# Patient Record
Sex: Female | Born: 1985 | Race: Black or African American | Hispanic: No | Marital: Single | Smoking: Never smoker
Health system: Southern US, Community
[De-identification: ages and names within clinical notes are randomized; demographics above are authoritative.]

## PROBLEM LIST (undated history)

## (undated) DIAGNOSIS — A599 Trichomoniasis, unspecified: Secondary | ICD-10-CM

## (undated) DIAGNOSIS — R7303 Prediabetes: Secondary | ICD-10-CM

## (undated) DIAGNOSIS — E119 Type 2 diabetes mellitus without complications: Secondary | ICD-10-CM

## (undated) DIAGNOSIS — M549 Dorsalgia, unspecified: Secondary | ICD-10-CM

## (undated) DIAGNOSIS — D649 Anemia, unspecified: Secondary | ICD-10-CM

## (undated) DIAGNOSIS — Z789 Other specified health status: Secondary | ICD-10-CM

## (undated) DIAGNOSIS — E669 Obesity, unspecified: Secondary | ICD-10-CM

## (undated) DIAGNOSIS — J020 Streptococcal pharyngitis: Secondary | ICD-10-CM

## (undated) DIAGNOSIS — O009 Unspecified ectopic pregnancy without intrauterine pregnancy: Secondary | ICD-10-CM

## (undated) DIAGNOSIS — B999 Unspecified infectious disease: Secondary | ICD-10-CM

## (undated) DIAGNOSIS — B9689 Other specified bacterial agents as the cause of diseases classified elsewhere: Secondary | ICD-10-CM

## (undated) DIAGNOSIS — G4730 Sleep apnea, unspecified: Secondary | ICD-10-CM

## (undated) DIAGNOSIS — N760 Acute vaginitis: Secondary | ICD-10-CM

## (undated) HISTORY — DX: Sleep apnea, unspecified: G47.30

## (undated) HISTORY — DX: Prediabetes: R73.03

## (undated) HISTORY — PX: SALPINGECTOMY: SHX328

## (undated) HISTORY — DX: Type 2 diabetes mellitus without complications: E11.9

## (undated) HISTORY — PX: TUBAL LIGATION: SHX77

## (undated) HISTORY — DX: Dorsalgia, unspecified: M54.9

---
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
## 2005-01-28 ENCOUNTER — Emergency Department (HOSPITAL_COMMUNITY): Admission: EM | Admit: 2005-01-28 | Discharge: 2005-01-29 | Payer: Self-pay | Admitting: Emergency Medicine

## 2005-10-20 ENCOUNTER — Emergency Department (HOSPITAL_COMMUNITY): Admission: AD | Admit: 2005-10-20 | Discharge: 2005-10-20 | Payer: Self-pay | Admitting: Family Medicine

## 2006-01-08 ENCOUNTER — Emergency Department (HOSPITAL_COMMUNITY): Admission: EM | Admit: 2006-01-08 | Discharge: 2006-01-08 | Payer: Self-pay | Admitting: Family Medicine

## 2006-01-10 ENCOUNTER — Emergency Department (HOSPITAL_COMMUNITY): Admission: EM | Admit: 2006-01-10 | Discharge: 2006-01-10 | Payer: Self-pay | Admitting: Emergency Medicine

## 2006-04-03 ENCOUNTER — Emergency Department (HOSPITAL_COMMUNITY): Admission: EM | Admit: 2006-04-03 | Discharge: 2006-04-03 | Payer: Self-pay | Admitting: Family Medicine

## 2006-04-15 ENCOUNTER — Inpatient Hospital Stay (HOSPITAL_COMMUNITY): Admission: AD | Admit: 2006-04-15 | Discharge: 2006-04-16 | Payer: Self-pay | Admitting: Obstetrics and Gynecology

## 2006-04-16 ENCOUNTER — Encounter (INDEPENDENT_AMBULATORY_CARE_PROVIDER_SITE_OTHER): Payer: Self-pay | Admitting: Specialist

## 2006-05-19 ENCOUNTER — Emergency Department (HOSPITAL_COMMUNITY): Admission: EM | Admit: 2006-05-19 | Discharge: 2006-05-19 | Payer: Self-pay | Admitting: Emergency Medicine

## 2007-01-02 ENCOUNTER — Emergency Department (HOSPITAL_COMMUNITY): Admission: EM | Admit: 2007-01-02 | Discharge: 2007-01-02 | Payer: Self-pay | Admitting: Family Medicine

## 2007-02-09 ENCOUNTER — Emergency Department (HOSPITAL_COMMUNITY): Admission: EM | Admit: 2007-02-09 | Discharge: 2007-02-09 | Payer: Self-pay | Admitting: Family Medicine

## 2007-09-13 ENCOUNTER — Emergency Department (HOSPITAL_COMMUNITY): Admission: EM | Admit: 2007-09-13 | Discharge: 2007-09-13 | Payer: Self-pay | Admitting: Family Medicine

## 2007-09-17 ENCOUNTER — Inpatient Hospital Stay (HOSPITAL_COMMUNITY): Admission: AD | Admit: 2007-09-17 | Discharge: 2007-09-17 | Payer: Self-pay | Admitting: Gynecology

## 2007-11-17 ENCOUNTER — Inpatient Hospital Stay (HOSPITAL_COMMUNITY): Admission: AD | Admit: 2007-11-17 | Discharge: 2007-11-18 | Payer: Self-pay | Admitting: Obstetrics

## 2008-05-06 ENCOUNTER — Emergency Department (HOSPITAL_COMMUNITY): Admission: EM | Admit: 2008-05-06 | Discharge: 2008-05-06 | Payer: Self-pay | Admitting: Emergency Medicine

## 2008-06-18 ENCOUNTER — Inpatient Hospital Stay (HOSPITAL_COMMUNITY): Admission: AD | Admit: 2008-06-18 | Discharge: 2008-06-19 | Payer: Self-pay | Admitting: Obstetrics & Gynecology

## 2008-08-23 ENCOUNTER — Emergency Department (HOSPITAL_COMMUNITY): Admission: EM | Admit: 2008-08-23 | Discharge: 2008-08-23 | Payer: Self-pay | Admitting: Family Medicine

## 2009-05-09 ENCOUNTER — Inpatient Hospital Stay (HOSPITAL_COMMUNITY): Admission: AD | Admit: 2009-05-09 | Discharge: 2009-05-09 | Payer: Self-pay | Admitting: Obstetrics

## 2009-10-03 ENCOUNTER — Emergency Department (HOSPITAL_COMMUNITY): Admission: EM | Admit: 2009-10-03 | Discharge: 2009-10-03 | Payer: Self-pay | Admitting: Family Medicine

## 2009-11-03 ENCOUNTER — Ambulatory Visit: Payer: Self-pay | Admitting: Nurse Practitioner

## 2009-11-26 ENCOUNTER — Emergency Department (HOSPITAL_COMMUNITY): Admission: EM | Admit: 2009-11-26 | Discharge: 2009-11-27 | Payer: Self-pay | Admitting: Emergency Medicine

## 2010-02-04 ENCOUNTER — Inpatient Hospital Stay (HOSPITAL_COMMUNITY)
Admission: AD | Admit: 2010-02-04 | Discharge: 2010-02-04 | Payer: Self-pay | Source: Home / Self Care | Admitting: Obstetrics & Gynecology

## 2010-02-08 ENCOUNTER — Inpatient Hospital Stay (HOSPITAL_COMMUNITY): Admission: AD | Admit: 2010-02-08 | Discharge: 2009-11-03 | Payer: Self-pay | Admitting: Obstetrics & Gynecology

## 2010-04-04 ENCOUNTER — Other Ambulatory Visit: Payer: Self-pay

## 2010-04-04 ENCOUNTER — Other Ambulatory Visit: Payer: Self-pay | Admitting: Family Medicine

## 2010-04-04 ENCOUNTER — Inpatient Hospital Stay (INDEPENDENT_AMBULATORY_CARE_PROVIDER_SITE_OTHER)
Admission: RE | Admit: 2010-04-04 | Discharge: 2010-04-04 | Disposition: A | Discharge status: Home / Self Care | Payer: Self-pay | Source: Ambulatory Visit | Attending: Emergency Medicine | Admitting: Emergency Medicine

## 2010-04-04 DIAGNOSIS — N12 Tubulo-interstitial nephritis, not specified as acute or chronic: Secondary | ICD-10-CM

## 2010-04-05 ENCOUNTER — Ambulatory Visit (INDEPENDENT_AMBULATORY_CARE_PROVIDER_SITE_OTHER): Payer: Self-pay

## 2010-04-05 DIAGNOSIS — S61209A Unspecified open wound of unspecified finger without damage to nail, initial encounter: Secondary | ICD-10-CM

## 2010-04-05 LAB — POCT URINALYSIS DIPSTICK
Ketones, ur: NEGATIVE mg/dL
Protein, ur: 100 mg/dL — AB
Specific Gravity, Urine: 1.01 (ref 1.005–1.030)

## 2010-04-05 LAB — POCT PREGNANCY, URINE: Preg Test, Ur: NEGATIVE

## 2010-04-06 LAB — URINE CULTURE
Colony Count: >=100000
Culture  Setup Time: 201202012330

## 2010-05-14 LAB — URINALYSIS, ROUTINE W REFLEX MICROSCOPIC
Bilirubin Urine: NEGATIVE
Glucose, UA: NEGATIVE mg/dL
Ketones, ur: NEGATIVE mg/dL
pH: 5.5 (ref 5.0–8.0)

## 2010-05-14 LAB — WET PREP, GENITAL: Trich, Wet Prep: NONE SEEN

## 2010-05-14 LAB — GC/CHLAMYDIA PROBE AMP, GENITAL
Chlamydia, DNA Probe: NEGATIVE
GC Probe Amp, Genital: NEGATIVE

## 2010-05-14 LAB — URINE MICROSCOPIC-ADD ON

## 2010-05-17 LAB — URINALYSIS, ROUTINE W REFLEX MICROSCOPIC
Bilirubin Urine: NEGATIVE
Specific Gravity, Urine: 1.025 (ref 1.005–1.030)
pH: 5.5 (ref 5.0–8.0)

## 2010-05-17 LAB — URINE MICROSCOPIC-ADD ON

## 2010-05-17 LAB — WET PREP, GENITAL

## 2010-05-17 LAB — GC/CHLAMYDIA PROBE AMP, GENITAL: GC Probe Amp, Genital: NEGATIVE

## 2010-05-17 LAB — POCT PREGNANCY, URINE: Preg Test, Ur: NEGATIVE

## 2010-05-27 LAB — URINALYSIS, ROUTINE W REFLEX MICROSCOPIC
Glucose, UA: NEGATIVE mg/dL
Specific Gravity, Urine: 1.01 (ref 1.005–1.030)
Urobilinogen, UA: 0.2 mg/dL (ref 0.0–1.0)

## 2010-05-27 LAB — URINE MICROSCOPIC-ADD ON

## 2010-05-27 LAB — CBC
HCT: 30.9 % — ABNORMAL LOW (ref 36.0–46.0)
Hemoglobin: 9.7 g/dL — ABNORMAL LOW (ref 12.0–15.0)
MCHC: 31.3 g/dL (ref 30.0–36.0)
Platelets: 395 10*3/uL (ref 150–400)
RDW: 18.8 % — ABNORMAL HIGH (ref 11.5–15.5)

## 2010-05-27 LAB — WET PREP, GENITAL: Yeast Wet Prep HPF POC: NONE SEEN

## 2010-05-27 LAB — POCT PREGNANCY, URINE: Preg Test, Ur: NEGATIVE

## 2010-06-11 LAB — POCT RAPID STREP A (OFFICE): Streptococcus, Group A Screen (Direct): POSITIVE — AB

## 2010-06-13 LAB — CBC
MCHC: 32.1 g/dL (ref 30.0–36.0)
MCV: 66.9 fL — ABNORMAL LOW (ref 78.0–100.0)
RDW: 17.4 % — ABNORMAL HIGH (ref 11.5–15.5)

## 2010-06-13 LAB — URINALYSIS, ROUTINE W REFLEX MICROSCOPIC
Nitrite: NEGATIVE
Specific Gravity, Urine: 1.025 (ref 1.005–1.030)
Urobilinogen, UA: 1 mg/dL (ref 0.0–1.0)
pH: 5.5 (ref 5.0–8.0)

## 2010-06-13 LAB — URINE MICROSCOPIC-ADD ON

## 2010-06-14 LAB — POCT RAPID STREP A (OFFICE): Streptococcus, Group A Screen (Direct): POSITIVE — AB

## 2010-06-16 ENCOUNTER — Inpatient Hospital Stay (HOSPITAL_COMMUNITY)
Admission: AD | Admit: 2010-06-16 | Discharge: 2010-06-16 | Disposition: A | Discharge status: Home / Self Care | Payer: Self-pay | Source: Ambulatory Visit | Attending: Obstetrics and Gynecology | Admitting: Obstetrics and Gynecology

## 2010-06-16 DIAGNOSIS — N920 Excessive and frequent menstruation with regular cycle: Secondary | ICD-10-CM

## 2010-06-16 LAB — POCT PREGNANCY, URINE: Preg Test, Ur: NEGATIVE

## 2010-06-16 LAB — URINALYSIS, ROUTINE W REFLEX MICROSCOPIC
Bilirubin Urine: NEGATIVE
Ketones, ur: NEGATIVE mg/dL
Nitrite: NEGATIVE
Specific Gravity, Urine: 1.02 (ref 1.005–1.030)
Urobilinogen, UA: 0.2 mg/dL (ref 0.0–1.0)

## 2010-06-16 LAB — URINE MICROSCOPIC-ADD ON

## 2010-06-16 LAB — WET PREP, GENITAL
Trich, Wet Prep: NONE SEEN
Yeast Wet Prep HPF POC: NONE SEEN

## 2010-06-19 LAB — GC/CHLAMYDIA PROBE AMP, GENITAL
Chlamydia, DNA Probe: UNDETERMINED
GC Probe Amp, Genital: NEGATIVE

## 2010-07-05 ENCOUNTER — Encounter: Payer: Self-pay | Admitting: Obstetrics and Gynecology

## 2010-07-12 ENCOUNTER — Emergency Department (HOSPITAL_COMMUNITY)
Admission: EM | Admit: 2010-07-12 | Discharge: 2010-07-13 | Disposition: A | Discharge status: Home / Self Care | Payer: Self-pay | Attending: Emergency Medicine | Admitting: Emergency Medicine

## 2010-07-12 DIAGNOSIS — M25539 Pain in unspecified wrist: Secondary | ICD-10-CM | POA: Insufficient documentation

## 2010-07-12 DIAGNOSIS — G5600 Carpal tunnel syndrome, unspecified upper limb: Secondary | ICD-10-CM | POA: Insufficient documentation

## 2010-07-18 ENCOUNTER — Other Ambulatory Visit: Payer: Self-pay | Admitting: Obstetrics and Gynecology

## 2010-07-18 ENCOUNTER — Encounter (INDEPENDENT_AMBULATORY_CARE_PROVIDER_SITE_OTHER): Payer: Self-pay | Admitting: Obstetrics and Gynecology

## 2010-07-18 DIAGNOSIS — Z124 Encounter for screening for malignant neoplasm of cervix: Secondary | ICD-10-CM

## 2010-07-18 DIAGNOSIS — Z01419 Encounter for gynecological examination (general) (routine) without abnormal findings: Secondary | ICD-10-CM

## 2010-07-18 DIAGNOSIS — N949 Unspecified condition associated with female genital organs and menstrual cycle: Secondary | ICD-10-CM

## 2010-07-19 ENCOUNTER — Ambulatory Visit (HOSPITAL_COMMUNITY)
Admission: RE | Admit: 2010-07-19 | Discharge: 2010-07-19 | Disposition: A | Discharge status: Home / Self Care | Payer: Self-pay | Source: Ambulatory Visit | Attending: Obstetrics and Gynecology | Admitting: Obstetrics and Gynecology

## 2010-07-19 DIAGNOSIS — N949 Unspecified condition associated with female genital organs and menstrual cycle: Secondary | ICD-10-CM

## 2010-07-19 DIAGNOSIS — N926 Irregular menstruation, unspecified: Secondary | ICD-10-CM | POA: Insufficient documentation

## 2010-07-19 DIAGNOSIS — N938 Other specified abnormal uterine and vaginal bleeding: Secondary | ICD-10-CM | POA: Insufficient documentation

## 2010-07-19 NOTE — Group Therapy Note (Signed)
NAME:  Regina Watkins, ARCHULETTA NO.:  [id number]  MEDICAL RECORD NO.:  [id number]           PATIENT TYPE:  A  LOCATION:  [facility]                   FACILITY:  [facility]  PHYSICIAN:  Argentina Donovan, MD        DATE OF BIRTH:  1986/02/28  DATE OF SERVICE:  07/18/2010                                 CLINIC NOTE  HISTORY OF PRESENT ILLNESS:  The patient is a 25 year old gravida 1, para 0-0-1-0 with a history of ectopic pregnancy 3 years ago with a salpingectomy, probably left.  She is morbidly obese weighing 289 pounds, being 5 feet 5 inches tall.  Her blood pressure is 143/82 and she comes in because of irregular bleeding.  She was referred by the maternity admissions.  No blood count was drawn at that time.  She does take the iron but she said up until a year ago she had very irregular periods.  She got pregnant while taking Yas 3 years ago which is not surprising being and taking birth control pills with a weight like that. On the other hand, she for the past almost a year has had regular periods but had been very irregularly using 10 pads a day and lasting 7 days at the beginning of each month.  She went into the MAU because this 1 month period.  It stopped for 4 days and then began bleeding and passing clots with pain.  She has not had a Pap smear in couple of years, so we went ahead and did one today as well as a wet prep, treated her with Flagyl for BV, and we are going to get an ultrasound, a CBC, and have her come back in 2 weeks and evaluate that.  She has no sign of hirsutism, although she is very heavy and up until this year as stated she always had irregular periods but they became regular over the past year when attempting pregnancy.  PHYSICAL EXAMINATION:  External genitalia is normal.  BUS is normal but extremely deep within the folds of the perineum.  Vagina is clean and well rugated with much of the redundant tissue as well as the cervix being nulliparous.  Uterus  and adnexa could not be palpated.  IMPRESSION:  Episode of dysfunctional bleeding, menorrhagia for past year, and morbid obesity.          ______________________________ Argentina Donovan, MD    PR/MEDQ  D:  07/18/2010  T:  07/19/2010  Job:  [id number]

## 2010-07-20 NOTE — Op Note (Signed)
NAME:  Regina Watkins, Regina Watkins            ACCOUNT NO.:  [id number]   MEDICAL RECORD NO.:  [id number]          PATIENT TYPE:  INP   LOCATION:  9305                          FACILITY:  [facility]   PHYSICIAN:  Roseanna Rainbow, M.D.DATE OF BIRTH:  Feb 03, 1986   DATE OF PROCEDURE:  04/16/2006  DATE OF DISCHARGE:                               OPERATIVE REPORT   PREOPERATIVE DIAGNOSIS:  Rule out right-sided tubal ectopic pregnancy.   POSTOPERATIVE DIAGNOSIS:  Rule out right-sided tubal ectopic pregnancy.   PROCEDURE:  Operative laparoscopy, right salpingectomy.   SURGEON:  Tim Lair.   ANESTHESIA:  General tracheal.   PATHOLOGY:  Right fallopian tube with products of conception.   ESTIMATED BLOOD LOSS:  Minimal.   COMPLICATIONS:  None.   PROCEDURE:  The patient was taken to the operating room with an IV  running.  She was given general anesthesia, placed in the dorsal  lithotomy position and prepped and draped in the usual sterile fashion.  An infraumbilical skin incision was then made with a scalpel and carried  down to the underlying fascia.  Fascia was tented up and entered  sharply.  The parietal peritoneum was tented up and entered sharply as  well.  A Hasson trocar sleeve was then advanced into the abdomen.  The  abdomen was then insufflated with CO2 gas.  A survey of the pelvic  anatomy revealed a mid isthmic tubal ectopic pregnancy on the right.  The tube was intact; however, the tube was dilated several centimeters.  The Hulka manipulator was advanced into the uterus as a means to  manipulate the uterus.  Bilateral lower quadrant 5-mm trocar and sleeves  were then advanced into the abdomen under direct visualization as well  as a suprapubic trocar and sleeve.  The mesosalpinx was then cauterized  with using the Kleppinger and divided with Endoshears.  The tube was  placed in an EndoCatch and removed from the abdomen.  The operative site  was irrigated.  Adequate  hemostasis noted.  The suprapubic and bilateral  lower abdominal trocar sleeves were removed under direct visualization.  The fascial incisions for the infraumbilical incision as well as the  suprapubic incision was reapproximated using interrupted sutures of 0  Vicryl.  The skin was closed with interrupted sutures of 0 Monocryl in a  subcuticular fashion.  Dermabond was applied to the incisions.  At the  close of the procedure, the instrument and pack counts were said to be  correct x2.  The Hulka manipulator was removed from the vagina and  cervix with minimal bleeding noted.  The patient was taken to the PACU  awake and in stable condition.     Roseanna Rainbow, M.D.  Electronically Signed    LAJ/MEDQ  D:  04/16/2006  T:  04/16/2006  Job:  [id number]

## 2010-08-08 ENCOUNTER — Ambulatory Visit: Payer: Self-pay | Admitting: Obstetrics and Gynecology

## 2010-08-16 ENCOUNTER — Ambulatory Visit: Payer: Self-pay | Admitting: Physician Assistant

## 2010-09-12 ENCOUNTER — Inpatient Hospital Stay (HOSPITAL_COMMUNITY)
Admission: AD | Admit: 2010-09-12 | Discharge: 2010-09-13 | Disposition: A | Discharge status: Home / Self Care | Payer: Self-pay | Source: Ambulatory Visit | Attending: Obstetrics & Gynecology | Admitting: Obstetrics & Gynecology

## 2010-09-12 DIAGNOSIS — R300 Dysuria: Secondary | ICD-10-CM | POA: Insufficient documentation

## 2010-09-12 HISTORY — DX: Other specified health status: Z78.9

## 2010-09-12 LAB — URINALYSIS, ROUTINE W REFLEX MICROSCOPIC
Bilirubin Urine: NEGATIVE
Glucose, UA: NEGATIVE mg/dL
Hgb urine dipstick: NEGATIVE
Ketones, ur: NEGATIVE mg/dL
Leukocytes, UA: NEGATIVE
Nitrite: NEGATIVE
Protein, ur: NEGATIVE mg/dL
Specific Gravity, Urine: 1.015 (ref 1.005–1.030)
Urobilinogen, UA: 2 mg/dL — ABNORMAL HIGH (ref 0.0–1.0)
pH: 7.5 (ref 5.0–8.0)

## 2010-09-12 NOTE — Progress Notes (Signed)
Pt states, " I have burning for one week when I pee and sometimes it feels like I have to go right back."

## 2010-09-13 ENCOUNTER — Encounter (HOSPITAL_COMMUNITY): Payer: Self-pay | Admitting: *Deleted

## 2010-09-13 NOTE — Progress Notes (Signed)
Pt given ama instructions.  Verbalized understanding of leaving ama without being seen.  Instructions signed.

## 2010-09-13 NOTE — ED Notes (Signed)
Pt called out to say that she couldn't wait any longer.  Encouraged pt to wait, that we have called another provider to come help with large pt volume.  Pt states she wishes to leave.

## 2010-09-13 NOTE — Progress Notes (Signed)
MAU provider aware of pt complaint of delay.  Aware of pt wish to sign out ama.

## 2010-09-13 NOTE — Initial Assessments (Signed)
Pt presents to mau with c/o burning with urination.  Has been going on for 2 weeks.  Pt states she was seen in may or June at the clinic and was told she had no infections.  Pt states she is not sexually active.

## 2010-11-29 LAB — POCT PREGNANCY, URINE: Preg Test, Ur: NEGATIVE

## 2010-11-30 LAB — WET PREP, GENITAL
Clue Cells Wet Prep HPF POC: NONE SEEN
Trich, Wet Prep: NONE SEEN

## 2010-11-30 LAB — POCT PREGNANCY, URINE
Operator id: 251141
Preg Test, Ur: NEGATIVE

## 2010-11-30 LAB — GC/CHLAMYDIA PROBE AMP, GENITAL
Chlamydia, DNA Probe: NEGATIVE
GC Probe Amp, Genital: NEGATIVE

## 2010-11-30 LAB — URINALYSIS, ROUTINE W REFLEX MICROSCOPIC
Bilirubin Urine: NEGATIVE
Glucose, UA: NEGATIVE
Ketones, ur: NEGATIVE
Protein, ur: NEGATIVE
Urobilinogen, UA: 0.2

## 2010-11-30 LAB — CBC
Hemoglobin: 11.4 — ABNORMAL LOW
MCHC: 32.2
RBC: 4.92
WBC: 10.4

## 2010-11-30 LAB — ALT: ALT: 19

## 2010-11-30 LAB — HCG, QUANTITATIVE, PREGNANCY: hCG, Beta Chain, Quant, S: <2

## 2010-12-10 LAB — POCT URINALYSIS DIP (DEVICE)
Glucose, UA: NEGATIVE
Hgb urine dipstick: NEGATIVE
Ketones, ur: 15 — AB
Nitrite: NEGATIVE
pH: 5

## 2010-12-10 LAB — POCT PREGNANCY, URINE: Preg Test, Ur: NEGATIVE

## 2010-12-12 LAB — POCT PREGNANCY, URINE: Operator id: 239701

## 2011-04-05 ENCOUNTER — Encounter (HOSPITAL_COMMUNITY): Payer: Self-pay | Admitting: *Deleted

## 2011-04-05 ENCOUNTER — Inpatient Hospital Stay (HOSPITAL_COMMUNITY)
Admission: AD | Admit: 2011-04-05 | Discharge: 2011-04-05 | Disposition: A | Discharge status: Home / Self Care | Payer: Self-pay | Source: Ambulatory Visit | Attending: Obstetrics and Gynecology | Admitting: Obstetrics and Gynecology

## 2011-04-05 DIAGNOSIS — N390 Urinary tract infection, site not specified: Secondary | ICD-10-CM | POA: Insufficient documentation

## 2011-04-05 DIAGNOSIS — R300 Dysuria: Secondary | ICD-10-CM | POA: Insufficient documentation

## 2011-04-05 DIAGNOSIS — A499 Bacterial infection, unspecified: Secondary | ICD-10-CM | POA: Insufficient documentation

## 2011-04-05 DIAGNOSIS — N760 Acute vaginitis: Secondary | ICD-10-CM | POA: Insufficient documentation

## 2011-04-05 DIAGNOSIS — B9689 Other specified bacterial agents as the cause of diseases classified elsewhere: Secondary | ICD-10-CM | POA: Insufficient documentation

## 2011-04-05 LAB — URINALYSIS, ROUTINE W REFLEX MICROSCOPIC
Ketones, ur: NEGATIVE mg/dL
Nitrite: NEGATIVE
Protein, ur: NEGATIVE mg/dL
pH: 7 (ref 5.0–8.0)

## 2011-04-05 LAB — WET PREP, GENITAL

## 2011-04-05 LAB — URINE MICROSCOPIC-ADD ON

## 2011-04-05 MED ORDER — SULFAMETHOXAZOLE-TRIMETHOPRIM 800-160 MG PO TABS: 1.0000 | ORAL_TABLET | Freq: Two times a day (BID) | ORAL | Status: AC

## 2011-04-05 MED ORDER — METRONIDAZOLE 500 MG PO TABS: 500.0000 mg | ORAL_TABLET | Freq: Two times a day (BID) | ORAL | Status: AC

## 2011-04-05 NOTE — Progress Notes (Signed)
Pt states, " Today I started having burning and pressure in my low abdomen when I pee, and when I wiped I saw light pink blood. I've been using a different soap this week, and felt some vaginal irritation and it has gotten worse and I have a little odor."

## 2011-04-05 NOTE — Progress Notes (Signed)
W. Muhammed CNM in to discuss lab results and d/c plan. Written and verbal d/c instructions given and understanding voiced. 

## 2011-04-05 NOTE — ED Provider Notes (Signed)
History     Chief Complaint  Patient presents with  . Vaginal Discharge  . Dysuria   HPI  Pt states, " Today I started having burning and pressure in my low abdomen when I pee, and when I wiped I saw light pink blood. I've been using a different soap this week, and felt some vaginal irritation and it has gotten worse".  Pt also reports some vaginal odor.   Past Medical History  Diagnosis Date  . No pertinent past medical history     Past Surgical History  Procedure Date  . Salpingectomy     Family History  Problem Relation Age of Onset  . Heart disease Mother   . Diabetes Mother   . Diabetes Father   . Heart disease Father   . Anesthesia problems Neg Hx   . Hypotension Neg Hx   . Malignant hyperthermia Neg Hx   . Pseudochol deficiency Neg Hx     History  Substance Use Topics  . Smoking status: Never Smoker   . Smokeless tobacco: Not on file  . Alcohol Use: No    Allergies: No Known Allergies  Prescriptions prior to admission  Medication Sig Dispense Refill  . Prenatal Vit-Fe Fumarate-FA (PRENATAL MULTIVITAMIN) TABS Take 1 tablet by mouth daily.        Review of Systems  Constitutional: Negative.   Genitourinary: Positive for dysuria, urgency and hematuria. Negative for frequency and flank pain.       Vaginal odor; spotting with wiping  All other systems reviewed and are negative.   Physical Exam   Blood pressure 121/62, pulse 67, temperature 98.2 F (36.8 C), temperature source Oral, resp. rate 16, height 5\' 5"  (1.651 m), weight 126.327 kg (278 lb 8 oz), last menstrual period 03/19/2011.  Physical Exam  Constitutional: She is oriented to person, place, and time. She appears well-developed and well-nourished. No distress.  HENT:  Head: Normocephalic.  Neck: Normal range of motion. Neck supple.  Cardiovascular: Normal rate and regular rhythm.   Respiratory: Effort normal and breath sounds normal.  GI: Soft. She exhibits no mass. There is no  tenderness. There is no guarding.  Genitourinary: Vaginal discharge (white, creamy) found.       +fishy odor  Neurological: She is alert and oriented to person, place, and time. She has normal reflexes.  Skin: Skin is warm and dry.    MAU Course  Procedures  Results for orders placed during the hospital encounter of 04/05/11 (from the past 24 hour(s))  URINALYSIS, ROUTINE W REFLEX MICROSCOPIC     Status: Abnormal   Collection Time   04/05/11 10:31 PM      Component Value Range   Color, Urine YELLOW  YELLOW    APPearance CLEAR  CLEAR    Specific Gravity, Urine 1.015  1.005 - 1.030    pH 7.0  5.0 - 8.0    Glucose, UA NEGATIVE  NEGATIVE (mg/dL)   Hgb urine dipstick LARGE (*) NEGATIVE    Bilirubin Urine NEGATIVE  NEGATIVE    Ketones, ur NEGATIVE  NEGATIVE (mg/dL)   Protein, ur NEGATIVE  NEGATIVE (mg/dL)   Urobilinogen, UA 1.0  0.0 - 1.0 (mg/dL)   Nitrite NEGATIVE  NEGATIVE    Leukocytes, UA SMALL (*) NEGATIVE   URINE MICROSCOPIC-ADD ON     Status: Abnormal   Collection Time   04/05/11 10:31 PM      Component Value Range   Squamous Epithelial / LPF FEW (*) RARE  WBC, UA 11-20  <3 (WBC/hpf)   RBC / HPF TOO NUMEROUS TO COUNT  <3 (RBC/hpf)   Urine-Other MUCOUS PRESENT    WET PREP, GENITAL     Status: Abnormal   Collection Time   04/05/11 11:00 PM      Component Value Range   Yeast Wet Prep HPF POC NONE SEEN  NONE SEEN    Trich, Wet Prep NONE SEEN  NONE SEEN    Clue Cells Wet Prep HPF POC MODERATE (*) NONE SEEN    WBC, Wet Prep HPF POC FEW (*) NONE SEEN    Urine pregnancy test - negative  Assessment and Plan  Urinary Tract Infection Bacterial Vaginosis  Plan: RX Bactrim Flagyl Urine culture F/U as needed  [facility] 04/05/2011, 10:57 PM

## 2011-04-06 LAB — GC/CHLAMYDIA PROBE AMP, GENITAL
Chlamydia, DNA Probe: NEGATIVE
GC Probe Amp, Genital: NEGATIVE

## 2011-04-08 LAB — POCT PREGNANCY, URINE: Preg Test, Ur: NEGATIVE

## 2011-04-09 NOTE — ED Provider Notes (Signed)
Attestation of Attending Supervision of Advanced Practitioner: Evaluation and management procedures were performed by the PA/NP/CNM/OB Fellow under my supervision/collaboration. Chart reviewed and agree with management and plan.  Tilda Burrow 04/09/2011 6:53 PM

## 2011-04-22 ENCOUNTER — Other Ambulatory Visit: Payer: Self-pay | Admitting: Family

## 2011-04-22 MED ORDER — FLUCONAZOLE 150 MG PO TABS: 150.0000 mg | ORAL_TABLET | Freq: Once | ORAL | Status: AC

## 2011-06-10 ENCOUNTER — Ambulatory Visit (INDEPENDENT_AMBULATORY_CARE_PROVIDER_SITE_OTHER): Payer: BC Managed Care – PPO | Admitting: Advanced Practice Midwife

## 2011-06-10 ENCOUNTER — Encounter: Payer: Self-pay | Admitting: Obstetrics and Gynecology

## 2011-06-10 VITALS — BP 120/86 | HR 87 | Temp 97.9°F | Ht 64.5 in | Wt 281.7 lb

## 2011-06-10 DIAGNOSIS — Z8759 Personal history of other complications of pregnancy, childbirth and the puerperium: Secondary | ICD-10-CM

## 2011-06-10 DIAGNOSIS — Z124 Encounter for screening for malignant neoplasm of cervix: Secondary | ICD-10-CM

## 2011-06-10 DIAGNOSIS — N979 Female infertility, unspecified: Secondary | ICD-10-CM

## 2011-06-10 DIAGNOSIS — Z8742 Personal history of other diseases of the female genital tract: Secondary | ICD-10-CM

## 2011-06-10 DIAGNOSIS — Z01419 Encounter for gynecological examination (general) (routine) without abnormal findings: Secondary | ICD-10-CM

## 2011-06-10 DIAGNOSIS — N644 Mastodynia: Secondary | ICD-10-CM

## 2011-06-10 NOTE — Patient Instructions (Signed)
Infertility WHAT IS INFERTILITY?  Infertility is usually defined as not being able to get pregnant after trying for one year of regular sexual intercourse without the use of contraceptives. Or not being able to carry a pregnancy to term and have a baby. The infertility rate in the [place] is around 10%. Pregnancy is the result of a chain of events. A woman must release an egg from one of her ovaries (ovulation). The egg must be fertilized by the female sperm. Then it travels through a fallopian tube into the uterus (womb), where it attaches to the wall of the uterus and grows. A man must have enough sperm, and the sperm must join with (fertilize) the egg along the way, at the proper time. The fertilized egg must then become attached to the inside of the uterus. While this may seem simple, many things can happen to prevent pregnancy from occurring.  WHOSE PROBLEM IS IT?  About 20% of infertility cases are due to problems with the man (female factors) and 65% are due to problems with the woman (female factors). Other cases are due to a combination of female and female factors or to unknown causes.  WHAT CAUSES INFERTILITY IN MEN?  Infertility in men is often caused by problems with making enough normal sperm or getting the sperm to reach the egg. Problems with sperm may exist from birth or develop later in life, due to illness or injury. Some men produce no sperm, or produce too few sperm (oligospermia). Other problems include: Sexual dysfunction.  Hormonal or endocrine problems.  Age. Female fertility decreases with age, but not at as young an age as female fertility.  Infection.  Congenital problems. Birth defect, such as absence of the tubes that carry the sperm (vas deferens).  Genetic/chromosomal problems.  Antisperm antibody problems.  Retrograde ejaculation (sperm go into the bladder).  Varicoceles, spematoceles, or tumors of the testicles.  Lifestyle can influence the number and quality of a  man's sperm.  Alcohol and drugs can temporarily reduce sperm quality.  Environmental toxins, including pesticides and lead, may cause some cases of infertility in men.  WHAT CAUSES INFERTILITY IN WOMEN?  Problems with ovulation account for most infertility in women. Without ovulation, eggs are not available to be fertilized.  Signs of problems with ovulation include irregular menstrual periods or no periods at all.  Simple lifestyle factors, including stress, diet, or athletic training, can affect a woman's hormonal balance.  Age. Fertility begins to decrease in women in the early 56s and is worse after age 104.  Much less often, a hormonal imbalance from a serious medical problem, such as a pituitary gland tumor, thyroid or other chronic medical disease, can cause ovulation problems.  Pelvic infections.  Polycystic ovary syndrome (increase in female hormones, unable to ovulate).  Alcohol or illegal drugs.  Environmental toxins, radiation, pesticides, and certain chemicals.  Aging is an important factor in female infertility.  The ability of a woman's ovaries to produce eggs declines with age, especially after age 63. About one third of couples where the woman is over 35 will have problems with fertility.  By the time she reaches menopause when her monthly periods stop for good, a woman can no longer produce eggs or become pregnant.  Other problems can also lead to infertility in women. If the fallopian tubes are blocked at one or both ends, the egg cannot travel through the tubes into the uterus. Scar tissue (adhesions) in the pelvis may cause blocked tubes.  This may result from pelvic inflammatory disease, endometriosis, or surgery for an ectopic pregnancy (fertilized egg implanted outside the uterus) or any pelvic or abdominal surgery causing adhesions.  Fibroid tumors or polyps of the uterus.  Congenital (birth defect) abnormalities of the uterus.  Infection of the cervix (cervicitis).  Cervical  stenosis (narrowing).  Abnormal cervical mucus.  Polycystic ovary syndrome.  Having sexual intercourse too often (every other day or 4 to 5 times a week).  Obesity.  Anorexia.  Poor nutrition.  Over exercising, with loss of body fat.  DES. Your mother received diethylstilbesterol hormone when pregnant with you.  HOW IS INFERTILITY TESTED?  If you have been trying to have a baby without success, you may want to seek medical help. You should not wait for one year of trying before seeing a health care provider if: You are over 35.  You have reason to believe that there may be a fertility problem.  A medical evaluation may determine the reasons for a couple's infertility. Usually this process begins with: Physical exams.  Medical histories of both partners.  Sexual histories of both partners.  If there is no obvious problem, like improperly timed intercourse or absence of ovulation, tests may be needed.  For a man, testing usually begins with tests of his semen to look at:  The number of sperm.  The shape of sperm.  Movement of his sperm.  Taking a complete medical and surgical history.  Physical examination.  Check for infection of the female reproductive organs.  Sometimes hormone tests are done.  For a woman, the first step in testing is to find out if she is ovulating each month. There are several ways to do this. For example, she can keep track of changes in her morning body temperature and in the texture of her cervical mucus. Another tool is a home ovulation test kit, which can be bought at drug or grocery stores.  Checks of ovulation can also be done in the doctor's office, using blood tests for hormone levels or ultrasound tests of the ovaries. If the woman is ovulating, more tests will need to be done. Some common female tests include:  Hysterosalpingogram: An x-ray of the fallopian tubes and uterus after they are injected with dye. It shows if the tubes are open and shows the shape  of the uterus.  Laparoscopy: An exam of the tubes and other female organs for disease. A lighted tube called a laparoscope is used to see inside the abdomen.  Endometrial biopsy: Sample of uterus tissue taken on the first day of the menstrual period, to see if the tissue indicates you are ovulating.  Transvaginal ultrasound: Examines the female organs.  Hysteroscopy: Uses a lighted tube to examine the cervix and inside the uterus, to see if there are any abnormalities inside the uterus.  TREATMENT  Depending on the test results, different treatments can be suggested. The type of treatment depends on the cause. 85 to 90% of infertility cases are treated with drugs or surgery.  Various fertility drugs may be used for women with ovulation problems. It is important to talk with your caregiver about the drug to be used. You should understand the drug's benefits and side effects. Depending on the type of fertility drug and the dosage of the drug used, multiple births (twins or multiples) can occur in some women.  If needed, surgery can be done to repair damage to a woman's ovaries, fallopian tubes, cervix, or uterus.  Surgery or  medical treatment for endometriosis or polycystic ovary syndrome. Sometimes a man has an infertility problem that can be corrected with medicine or by surgery.  Intrauterine insemination (IUI) of sperm, timed with ovulation.  Change in lifestyle, if that is the cause (lose weight, increase exercise, and stop smoking, drinking excessively, or taking illegal drugs).  Other types of surgery:  Removing growths inside and on the uterus.  Removing scar tissue from inside of the uterus.  Fixing blocked tubes.  Removing scar tissue in the pelvis and around the female organs.  WHAT IS ASSISTED REPRODUCTIVE TECHNOLOGY (ART)?  Assisted reproductive technology (ART) is another form of special methods used to help infertile couples. ART involves handling both the woman's eggs and the man's  sperm. Success rates vary and depend on many factors. ART can be expensive and time-consuming. But ART has made it possible for many couples to have children that otherwise would not have been conceived. Some methods are listed below: In vitro fertilization (IVF). IVF is often used when a woman's fallopian tubes are blocked or when a man has low sperm counts. A drug is used to stimulate the ovaries to produce multiple eggs. Once mature, the eggs are removed and placed in a culture dish with the man's sperm for fertilization. After about 40 hours, the eggs are examined to see if they have become fertilized by the sperm and are dividing into cells. These fertilized eggs (embryos) are then placed in the woman's uterus. This bypasses the fallopian tubes.  Gamete intrafallopian transfer (GIFT) is similar to IVF, but used when the woman has at least one normal fallopian tube. Three to five eggs are placed in the fallopian tube, along with the man's sperm, for fertilization inside the woman's body.  Zygote intrafallopian transfer (ZIFT), also called tubal embryo transfer, combines IVF and GIFT. The eggs retrieved from the woman's ovaries are fertilized in the lab and placed in the fallopian tubes rather than in the uterus.  ART procedures sometimes involve the use of donor eggs (eggs from another woman) or previously frozen embryos. Donor eggs may be used if a woman has impaired ovaries or has a genetic disease that could be passed on to her baby.  When performing ART, you are at higher risk for resulting in multiple pregnancies, twins, triplets or more.  Intracytoplasma sperm injection is a procedure that injects a single sperm into the egg to fertilize it.  Embryo transplant is a procedure that starts after growing an embryo in a special media (chemical solution) developed to keep the embryo alive for 2 to 5 days, and then transplanting it into the uterus.  In cases where a cause cannot be found and pregnancy  does not occur, adoption may be a consideration. Document Released: 02/21/2003 Document Revised: 02/07/2011 Document Reviewed: 01/17/2009 [facility] Patient Information 2012 [place], [place].Health Maintenance, Females A healthy lifestyle and preventative care can promote health and wellness.  Maintain regular health, dental, and eye exams.   Eat a healthy diet. Foods like vegetables, fruits, whole grains, low-fat dairy products, and lean protein foods contain the nutrients you need without too many calories. Decrease your intake of foods high in solid fats, added sugars, and salt. Get information about a proper diet from your caregiver, if necessary.   Regular physical exercise is one of the most important things you can do for your health. Most adults should get at least 150 minutes of moderate-intensity exercise (any activity that increases your heart rate and causes you to sweat)  each week. In addition, most adults need muscle-strengthening exercises on 2 or more days a week.    Maintain a healthy weight. The body mass index (BMI) is a screening tool to identify possible weight problems. It provides an estimate of body fat based on height and weight. Your caregiver can help determine your BMI, and can help you achieve or maintain a healthy weight. For adults 20 years and older:   A BMI below 18.5 is considered underweight.   A BMI of 18.5 to 24.9 is normal.   A BMI of 25 to 29.9 is considered overweight.   A BMI of 30 and above is considered obese.   Maintain normal blood lipids and cholesterol by exercising and minimizing your intake of saturated fat. Eat a balanced diet with plenty of fruits and vegetables. Blood tests for lipids and cholesterol should begin at age 37 and be repeated every 5 years. If your lipid or cholesterol levels are high, you are over 50, or you are a high risk for heart disease, you may need your cholesterol levels checked more frequently.Ongoing high lipid and  cholesterol levels should be treated with medicines if diet and exercise are not effective.   If you smoke, find out from your caregiver how to quit. If you do not use tobacco, do not start.   If you are pregnant, do not drink alcohol. If you are breastfeeding, be very cautious about drinking alcohol. If you are not pregnant and choose to drink alcohol, do not exceed 1 drink per day. One drink is considered to be 12 ounces (355 mL) of beer, 5 ounces (148 mL) of wine, or 1.5 ounces (44 mL) of liquor.   Avoid use of street drugs. Do not share needles with anyone. Ask for help if you need support or instructions about stopping the use of drugs.   High blood pressure causes heart disease and increases the risk of stroke. Blood pressure should be checked at least every 1 to 2 years. Ongoing high blood pressure should be treated with medicines, if weight loss and exercise are not effective.   If you are 21 to 26 years old, ask your caregiver if you should take aspirin to prevent strokes.   Diabetes screening involves taking a blood sample to check your fasting blood sugar level. This should be done once every 3 years, after age 104, if you are within normal weight and without risk factors for diabetes. Testing should be considered at a younger age or be carried out more frequently if you are overweight and have at least 1 risk factor for diabetes.   Breast cancer screening is essential preventative care for women. You should practice "breast self-awareness." This means understanding the normal appearance and feel of your breasts and may include breast self-examination. Any changes detected, no matter how small, should be reported to a caregiver. Women in their 77s and 30s should have a clinical breast exam (CBE) by a caregiver as part of a regular health exam every 1 to 3 years. After age 71, women should have a CBE every year. Starting at age 40, women should consider having a mammogram (breast X-ray) every  year. Women who have a family history of breast cancer should talk to their caregiver about genetic screening. Women at a high risk of breast cancer should talk to their caregiver about having an MRI and a mammogram every year.   The Pap test is a screening test for cervical cancer. Women should have a  Pap test starting at age 2. Between ages 15 and 24, Pap tests should be repeated every 2 years. Beginning at age 24, you should have a Pap test every 3 years as long as the past 3 Pap tests have been normal. If you had a hysterectomy for a problem that was not cancer or a condition that could lead to cancer, then you no longer need Pap tests. If you are between ages 60 and 63, and you have had normal Pap tests going back 10 years, you no longer need Pap tests. If you have had past treatment for cervical cancer or a condition that could lead to cancer, you need Pap tests and screening for cancer for at least 20 years after your treatment. If Pap tests have been discontinued, risk factors (such as a new sexual partner) need to be reassessed to determine if screening should be resumed. Some women have medical problems that increase the chance of getting cervical cancer. In these cases, your caregiver may recommend more frequent screening and Pap tests.   The human papillomavirus (HPV) test is an additional test that may be used for cervical cancer screening. The HPV test looks for the virus that can cause the cell changes on the cervix. The cells collected during the Pap test can be tested for HPV. The HPV test could be used to screen women aged 65 years and older, and should be used in women of any age who have unclear Pap test results. After the age of 67, women should have HPV testing at the same frequency as a Pap test.   Colorectal cancer can be detected and often prevented. Most routine colorectal cancer screening begins at the age of 30 and continues through age 20. However, your caregiver may recommend  screening at an earlier age if you have risk factors for colon cancer. On a yearly basis, your caregiver may provide home test kits to check for hidden blood in the stool. Use of a small camera at the end of a tube, to directly examine the colon (sigmoidoscopy or colonoscopy), can detect the earliest forms of colorectal cancer. Talk to your caregiver about this at age 72, when routine screening begins. Direct examination of the colon should be repeated every 5 to 10 years through age 43, unless early forms of pre-cancerous polyps or small growths are found.   Hepatitis C blood testing is recommended for all people born from [birth year] through 1965 and any individual with known risks for hepatitis C.   Practice safe sex. Use condoms and avoid high-risk sexual practices to reduce the spread of sexually transmitted infections (STIs). Sexually active women aged 9 and younger should be checked for Chlamydia, which is a common sexually transmitted infection. Older women with new or multiple partners should also be tested for Chlamydia. Testing for other STIs is recommended if you are sexually active and at increased risk.   Osteoporosis is a disease in which the bones lose minerals and strength with aging. This can result in serious bone fractures. The risk of osteoporosis can be identified using a bone density scan. Women ages 81 and over and women at risk for fractures or osteoporosis should discuss screening with their caregivers. Ask your caregiver whether you should be taking a calcium supplement or vitamin D to reduce the rate of osteoporosis.   Menopause can be associated with physical symptoms and risks. Hormone replacement therapy is available to decrease symptoms and risks. You should talk to your caregiver about whether  hormone replacement therapy is right for you.   Use sunscreen with a sun protection factor (SPF) of 30 or greater. Apply sunscreen liberally and repeatedly throughout the day. You should  seek shade when your shadow is shorter than you. Protect yourself by wearing long sleeves, pants, a wide-brimmed hat, and sunglasses year round, whenever you are outdoors.   Notify your caregiver of new moles or changes in moles, especially if there is a change in shape or color. Also notify your caregiver if a mole is larger than the size of a pencil eraser.   Stay current with your immunizations.  Document Released: 09/03/2010 Document Revised: 02/07/2011 Document Reviewed: 09/03/2010 [facility] Patient Information 2012 [place], [place].

## 2011-06-10 NOTE — Progress Notes (Signed)
  Subjective:     Regina Watkins is a 26 y.o. female here for a routine exam.  Current complaints: Needs pap, intermittent sharp left breast pain near nipple, occasional dyspareunia, difficulty getting pregnant after 7 yrs of unprotected intercourse (though did have an ectopic during that time).  Personal health questionnaire reviewed: not asked.    Gynecologic History Patient's last menstrual period was 05/21/2011. Contraception: none Last Pap: unsure. Results were: normal Last mammogram: never.  Obstetric History OB History    Grav Para Term Preterm Abortions TAB SAB Ect Mult Living   1 0 0 0 1 0 0 1 0 0      # Outc Date GA Lbr Len/2nd Wgt Sex Del Anes PTL Lv   1 ECT 2/09               The following portions of the patient's history were reviewed and updated as appropriate: allergies, current medications, past family history, past medical history, past social history, past surgical history and problem list.  Review of Systems Pertinent items are noted in HPI.   left breast pain, occ dyspareunia infertility Objective:    BP 120/86  Pulse 87  Temp(Src) 97.9 F (36.6 C) (Oral)  Ht 5' 4.5" (1.638 m)  Wt 281 lb 11.2 oz (127.778 kg)  BMI 47.61 kg/m2  LMP 05/21/2011 General appearance: alert and no distress Neck: no adenopathy, no carotid bruit, no JVD, supple, symmetrical, trachea midline and thyroid not enlarged, symmetric, no tenderness/mass/nodules Lungs: clear to auscultation bilaterally Breasts: normal appearance, no masses or tenderness some fibrocystic changes left breast Heart: regular rate and rhythm, S1, S2 normal, no murmur, click, rub or gallop Abdomen: soft, non-tender; bowel sounds normal; no masses,  no organomegaly Pelvic: adnexae not palpable, cervix normal in appearance, external genitalia normal, no adnexal masses or tenderness, no cervical motion tenderness, rectovaginal septum normal, uterus normal size, shape, and consistency and vagina normal without  discharge    Assessment:    Healthy female exam.    Plan:  L Breast US  Education reviewed: Possible eval for PCOS may be needed, infertility workup, weight loss, positional dyspareunia, selfbreast exam taught. Follow up in: 1 year. Will schedule L Breast US at [facility], though no suspicious mass noted other than fibrocystic changes   Discussed limiting caffeine Will see back as needed. Don't anticipate them finding anything on breast US, but will see if needed. Discussed negative pain with pelvic, dyspareunia is probably positional Needs infertility workup via infertility clinic if desired

## 2011-06-11 ENCOUNTER — Encounter: Payer: Self-pay | Admitting: Advanced Practice Midwife

## 2011-06-11 DIAGNOSIS — N644 Mastodynia: Secondary | ICD-10-CM | POA: Insufficient documentation

## 2011-06-11 DIAGNOSIS — N979 Female infertility, unspecified: Secondary | ICD-10-CM | POA: Insufficient documentation

## 2011-06-11 DIAGNOSIS — Z8759 Personal history of other complications of pregnancy, childbirth and the puerperium: Secondary | ICD-10-CM | POA: Insufficient documentation

## 2011-06-13 ENCOUNTER — Other Ambulatory Visit: Payer: BC Managed Care – PPO

## 2011-06-14 ENCOUNTER — Ambulatory Visit
Admission: RE | Admit: 2011-06-14 | Discharge: 2011-06-14 | Disposition: A | Discharge status: Home / Self Care | Payer: BC Managed Care – PPO | Source: Ambulatory Visit | Attending: Advanced Practice Midwife | Admitting: Advanced Practice Midwife

## 2011-06-14 ENCOUNTER — Encounter (HOSPITAL_COMMUNITY): Payer: Self-pay | Admitting: Emergency Medicine

## 2011-06-14 ENCOUNTER — Encounter: Payer: Self-pay | Admitting: Advanced Practice Midwife

## 2011-06-14 ENCOUNTER — Emergency Department (INDEPENDENT_AMBULATORY_CARE_PROVIDER_SITE_OTHER)
Admission: EM | Admit: 2011-06-14 | Discharge: 2011-06-14 | Disposition: A | Discharge status: Home / Self Care | Payer: BC Managed Care – PPO | Source: Home / Self Care

## 2011-06-14 DIAGNOSIS — N644 Mastodynia: Secondary | ICD-10-CM

## 2011-06-14 DIAGNOSIS — J309 Allergic rhinitis, unspecified: Secondary | ICD-10-CM

## 2011-06-14 MED ORDER — LORATADINE 10 MG PO TABS: 10.0000 mg | ORAL_TABLET | Freq: Every day | ORAL | Status: DC

## 2011-06-14 NOTE — ED Notes (Signed)
PT HERE WITH SORE THROAT THAT STARTED X 2 WEEKS AGO BUT IS GETTING WORSE WITH PAIN AND WHITE PATCHES.PT TOOK OLD AMOXICILLIN X 3 AT HOME.LOW GRADE TEMP TODAY

## 2011-06-14 NOTE — Discharge Instructions (Signed)
Tylenol or Ibuprofen as needed for discomfort. Increase fluids. Return as needed.  Allergic Rhinitis Allergic rhinitis is when the mucous membranes in the nose respond to allergens. Allergens are particles in the air that cause your body to have an allergic reaction. This causes you to release allergic antibodies. Through a chain of events, these eventually cause you to release histamine into the blood stream (hence the use of antihistamines). Although meant to be protective to the body, it is this release that causes your discomfort, such as frequent sneezing, congestion and an itchy runny nose.  CAUSES  The pollen allergens may come from grasses, trees, and weeds. This is seasonal allergic rhinitis, or "hay fever." Other allergens cause year-round allergic rhinitis (perennial allergic rhinitis) such as house dust mite allergen, pet dander and mold spores.  SYMPTOMS   Nasal stuffiness (congestion).   Runny, itchy nose with sneezing and tearing of the eyes.   There is often an itching of the mouth, eyes and ears.  It cannot be cured, but it can be controlled with medications. DIAGNOSIS  If you are unable to determine the offending allergen, skin or blood testing may find it. TREATMENT   Avoid the allergen.   Medications and allergy shots (immunotherapy) can help.   Hay fever may often be treated with antihistamines in pill or nasal spray forms. Antihistamines block the effects of histamine. There are over-the-counter medicines that may help with nasal congestion and swelling around the eyes. Check with your caregiver before taking or giving this medicine.  If the treatment above does not work, there are many new medications your caregiver can prescribe. Stronger medications may be used if initial measures are ineffective. Desensitizing injections can be used if medications and avoidance fails. Desensitization is when a patient is given ongoing shots until the body becomes less sensitive to  the allergen. Make sure you follow up with your caregiver if problems continue. SEEK MEDICAL CARE IF:   You develop fever (more than 100.5 F (38.1 C).   You develop a cough that does not stop easily (persistent).   You have shortness of breath.   You start wheezing.   Symptoms interfere with normal daily activities.  Document Released: 11/13/2000 Document Revised: 02/07/2011 Document Reviewed: 05/25/2008 [facility] Patient Information 2012 [place], [place].

## 2011-06-14 NOTE — ED Provider Notes (Signed)
History     CSN: [id number]  Arrival date & time 06/14/11  1341   None     Chief Complaint  Patient presents with  . Sore Throat    (Consider location/radiation/quality/duration/timing/severity/associated sxs/prior treatment) HPI Comments: Patient presents with complaints of sore throat for 2 weeks. She states that she took 3 doses of amoxicillin last week without any improvement. Upon further for clarification she states that she actually has a scratchy itchy throat, and not truly painful. No discomfort with swallowing. She has had no fever or chills. She does admit to nasal congestion, rhinorrhea and sneezing also. No ear discomfort or cough.    Past Medical History  Diagnosis Date  . No pertinent past medical history     Past Surgical History  Procedure Date  . Salpingectomy     Family History  Problem Relation Age of Onset  . Heart disease Mother   . Diabetes Mother   . Diabetes Father   . Heart disease Father   . Anesthesia problems Neg Hx   . Hypotension Neg Hx   . Malignant hyperthermia Neg Hx   . Pseudochol deficiency Neg Hx     History  Substance Use Topics  . Smoking status: Never Smoker   . Smokeless tobacco: Not on file  . Alcohol Use: No    OB History    Grav Para Term Preterm Abortions TAB SAB Ect Mult Living   1 0 0 0 1 0 0 1 0 0       Review of Systems  Constitutional: Negative for fever, chills and fatigue.  HENT: Positive for congestion, rhinorrhea, sneezing and postnasal drip. Negative for ear pain, sore throat and sinus pressure.   Respiratory: Negative for cough.   Cardiovascular: Negative for chest pain and palpitations.    Allergies  Review of patient's allergies indicates no known allergies.  Home Medications   Current Outpatient Rx  Name Route Sig Dispense Refill  . LORATADINE 10 MG PO TABS Oral Take 1 tablet (10 mg total) by mouth daily. 10 tablet 0  . PRENATAL MULTIVITAMIN CH Oral Take 1 tablet by mouth daily.      BP  114/76  Pulse 94  Temp(Src) 99.2 F (37.3 C) (Oral)  Resp 20  SpO2 95%  LMP 05/21/2011  Physical Exam  Nursing note and vitals reviewed. Constitutional: She appears well-developed and well-nourished. No distress.  HENT:  Head: Normocephalic and atraumatic.  Right Ear: Tympanic membrane, external ear and ear canal normal.  Left Ear: Tympanic membrane, external ear and ear canal normal.  Nose: Rhinorrhea (clear) present.  Mouth/Throat: Uvula is midline and mucous membranes are normal. No posterior oropharyngeal edema or posterior oropharyngeal erythema.       One pustule Lt inferior lateral tonsil noted without erythema or swelling. Posterior oropharynx also neg.   Neck: Neck supple.  Cardiovascular: Normal rate, regular rhythm and normal heart sounds.   Pulmonary/Chest: Effort normal and breath sounds normal. No respiratory distress.  Lymphadenopathy:    She has no cervical adenopathy.  Neurological: She is alert.  Skin: Skin is warm and dry.  Psychiatric: She has a normal mood and affect.    ED Course  Procedures (including critical care time)   Labs Reviewed  POCT RAPID STREP A ([facility] ONLY)   US Breast Left  06/14/2011  *RADIOLOGY REPORT*  Clinical Data:  Cyclical.  Periareolar left breast pain  LEFT BREAST ULTRASOUND  Comparison:  None.  On physical exam, I palpate normal tissue  in the periareolar region of the left breast.  Findings: Ultrasound is performed, showing normal tissue in the periareolar region of the left breast.  IMPRESSION: Normal ultrasound, left breast as detailed above.  Recommend screening mammography to begin at the age of 26 years old.  BI-RADS CATEGORY 1:  Negative.  Original Report Authenticated By: Hiram Gash, M.D.     1. Allergic rhinitis       MDM  Rapid strep neg.         Melody Comas, [place] 06/14/11 1556

## 2011-06-15 NOTE — ED Provider Notes (Signed)
Medical screening examination/treatment/procedure(s) were performed by non-physician practitioner and as supervising physician I was immediately available for consultation/collaboration.  Alen Bleacher, MD 06/15/11 2104

## 2011-06-19 ENCOUNTER — Telehealth: Payer: Self-pay | Admitting: *Deleted

## 2011-06-19 NOTE — Telephone Encounter (Signed)
Patient called wanting to know pap results.

## 2011-06-20 NOTE — Telephone Encounter (Signed)
Telephoned patient and gave results of pap smear. Pt had no additional questions.

## 2011-09-20 IMAGING — US US PELVIS COMPLETE
1 series · 13 of 25 positions shown · non-contrast
Comparison: [id number]

CLINICAL DATA: Abnormal uterine bleeding.  Irregular menses.  LMP
07/04/2010



[Series 1: us pelvis complete · 13 of 64 slices shown]
[im 1/64]
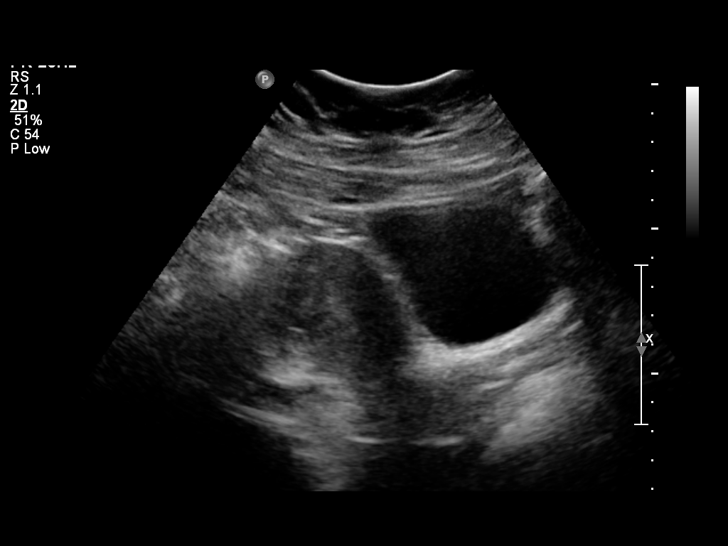
[im 6/64]
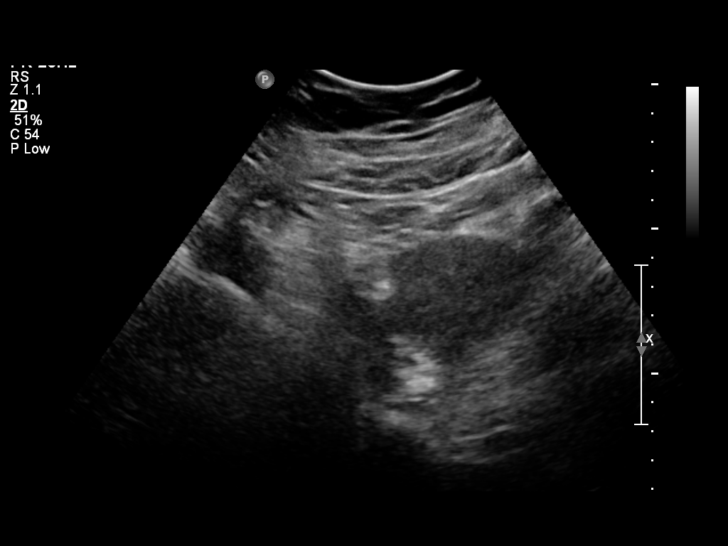
[im 11/64]
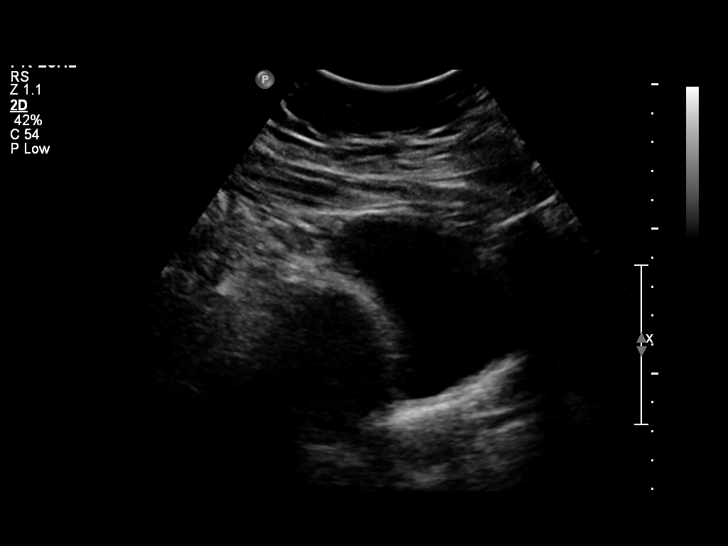
[im 16/64]
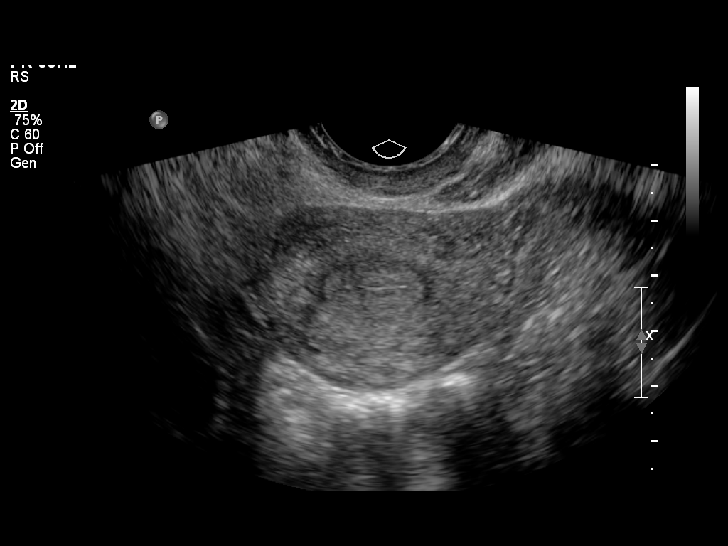
[im 22/64]
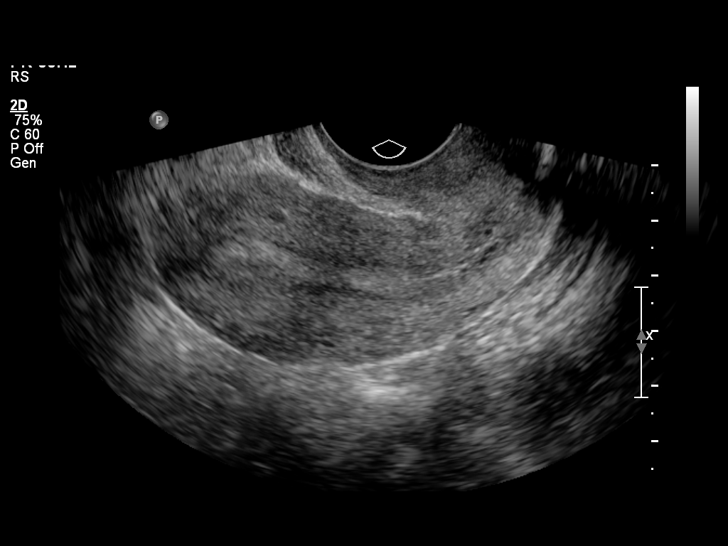
[im 27/64]
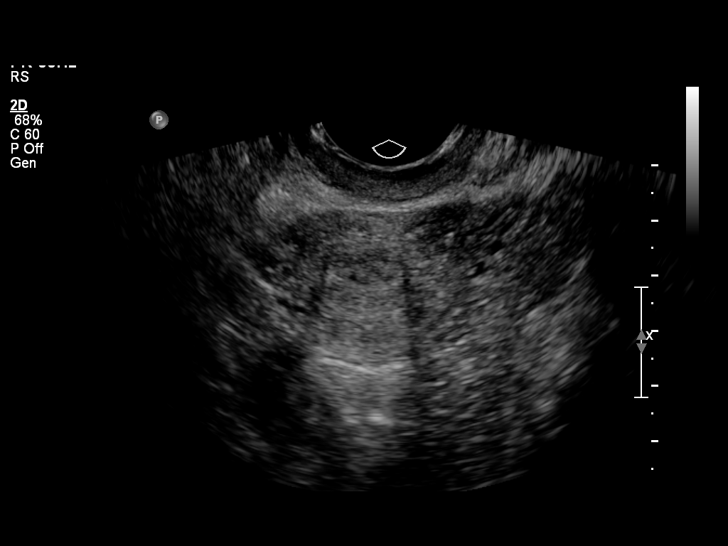
[im 32/64]
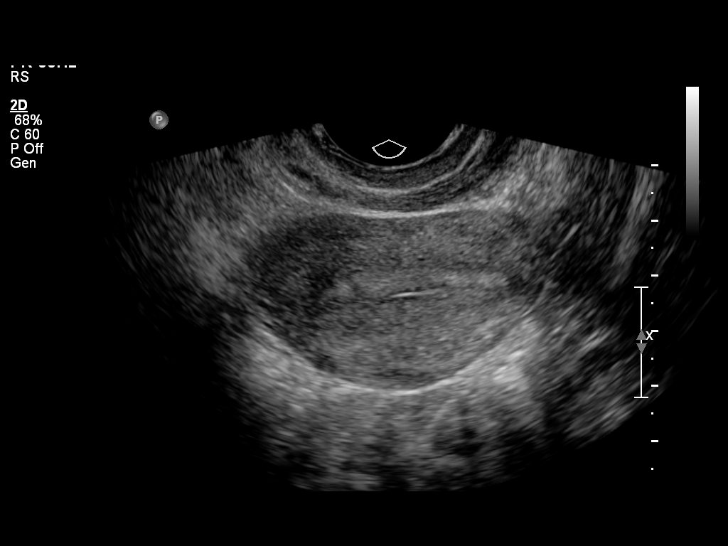
[im 37/64]
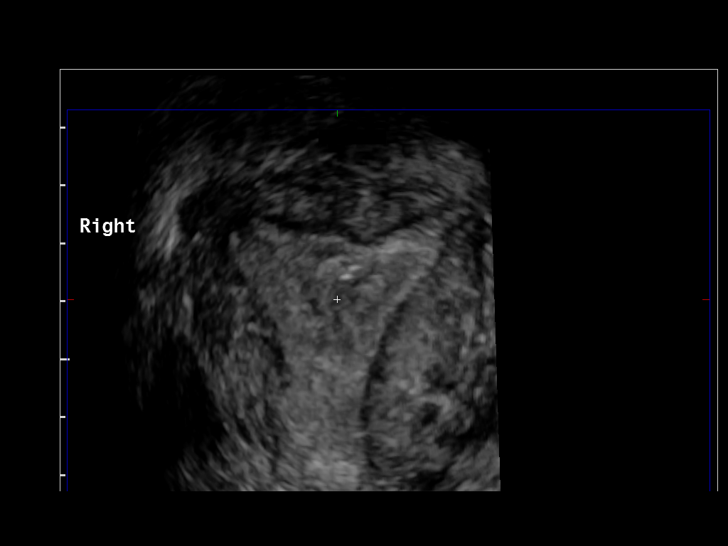
[im 43/64]
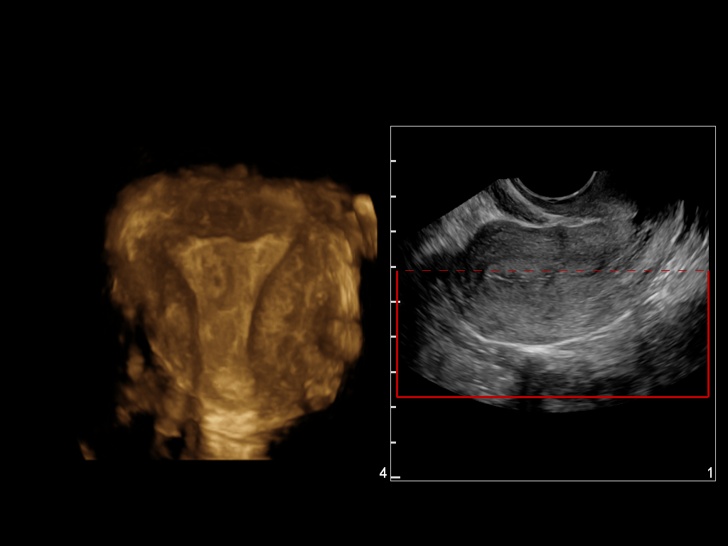
[im 48/64]
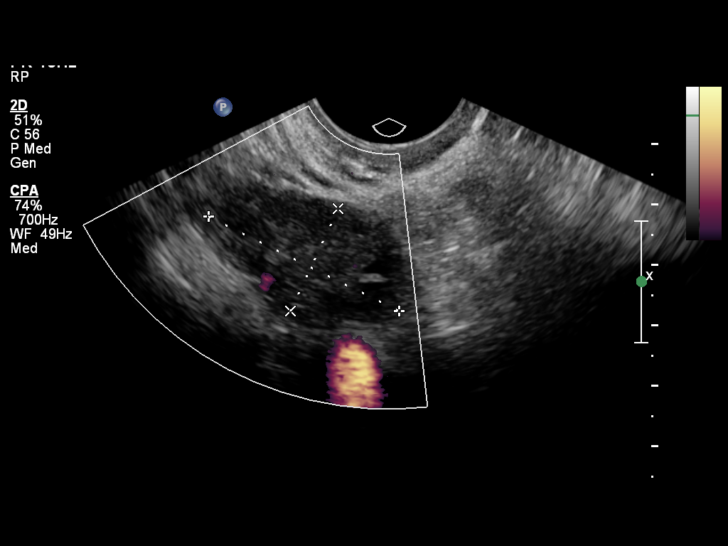
[im 53/64]
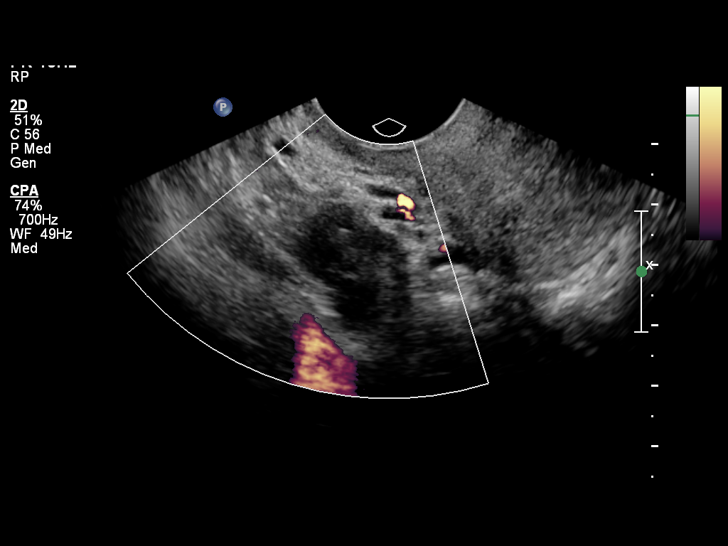
[im 58/64]
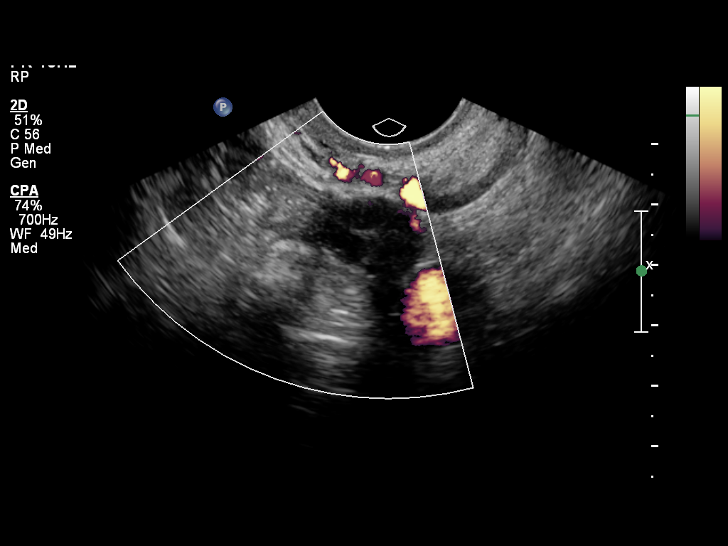
[im 64/64]
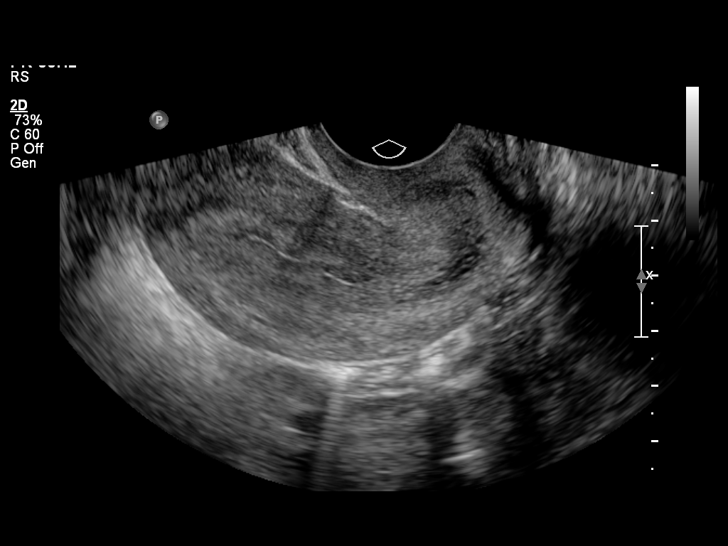

[13 of 25 positions shown; findings below may reference images not displayed]

FINDINGS: Uterus:  Demonstrates a sagittal length of 8.4 cm, AP depth of
cm and a transverse width of 4.5 cm.  A homogeneous uterine
myometrium is seen.  No fibroids or other uterine masses
identified.

Endometrium:  Has a late tri- layered appearance with an AP width
of 1.0 cm.  This would correlate with a late periovulatory
endometrial stripe and correspond with the patient's given LMP of
07/04/2010.  No areas of focal thickening or heterogeneity are
noted.

Right ovary:  Measures 3.5 x 1.9 x 1.7 cm and has a normal
appearance.

Left ovary:  Measures 1.6 x 2.4 x 1.3 cm and has a normal
appearance.

Other findings:  No pelvic fluid or separate adnexal masses are
seen.
IMPRESSION: Normal late periovulatory pelvic ultrasound.

## 2011-10-16 ENCOUNTER — Emergency Department (INDEPENDENT_AMBULATORY_CARE_PROVIDER_SITE_OTHER)
Admission: EM | Admit: 2011-10-16 | Discharge: 2011-10-16 | Disposition: A | Discharge status: Home / Self Care | Payer: BC Managed Care – PPO | Source: Home / Self Care | Attending: Emergency Medicine | Admitting: Emergency Medicine

## 2011-10-16 ENCOUNTER — Encounter (HOSPITAL_COMMUNITY): Payer: Self-pay | Admitting: *Deleted

## 2011-10-16 DIAGNOSIS — N760 Acute vaginitis: Secondary | ICD-10-CM

## 2011-10-16 DIAGNOSIS — J039 Acute tonsillitis, unspecified: Secondary | ICD-10-CM

## 2011-10-16 HISTORY — DX: Unspecified ectopic pregnancy without intrauterine pregnancy: O00.90

## 2011-10-16 HISTORY — DX: Trichomoniasis, unspecified: A59.9

## 2011-10-16 HISTORY — DX: Streptococcal pharyngitis: J02.0

## 2011-10-16 HISTORY — DX: Other specified bacterial agents as the cause of diseases classified elsewhere: B96.89

## 2011-10-16 HISTORY — DX: Obesity, unspecified: E66.9

## 2011-10-16 HISTORY — DX: Other specified bacterial agents as the cause of diseases classified elsewhere: N76.0

## 2011-10-16 LAB — POCT URINALYSIS DIP (DEVICE)
Bilirubin Urine: NEGATIVE
Glucose, UA: NEGATIVE mg/dL
Nitrite: NEGATIVE
Specific Gravity, Urine: 1.02 (ref 1.005–1.030)

## 2011-10-16 LAB — WET PREP, GENITAL: Trich, Wet Prep: NONE SEEN

## 2011-10-16 MED ORDER — FLUTICASONE PROPIONATE 50 MCG/ACT NA SUSP: 2.0000 | Freq: Every day | NASAL | Status: DC

## 2011-10-16 MED ORDER — DEXAMETHASONE SODIUM PHOSPHATE 10 MG/ML IJ SOLN
INTRAMUSCULAR | Status: AC
  Filled 2011-10-16: qty 1

## 2011-10-16 MED ORDER — IBUPROFEN 600 MG PO TABS: 600.0000 mg | ORAL_TABLET | Freq: Four times a day (QID) | ORAL | Status: AC | PRN

## 2011-10-16 MED ORDER — METRONIDAZOLE 500 MG PO TABS: 500.0000 mg | ORAL_TABLET | Freq: Two times a day (BID) | ORAL | Status: AC

## 2011-10-16 MED ORDER — DEXAMETHASONE SODIUM PHOSPHATE 10 MG/ML IJ SOLN
10.0000 mg | Freq: Once | INTRAMUSCULAR | Status: AC
  Administered 2011-10-16: 10 mg via INTRAMUSCULAR

## 2011-10-16 MED ORDER — HYDROCODONE-ACETAMINOPHEN 5-325 MG PO TABS: 2.0000 | ORAL_TABLET | ORAL | Status: AC | PRN

## 2011-10-16 NOTE — ED Notes (Signed)
Pt  Has  Symptoms  Of  sorethroat   And     Pain  When  She  Swallows     She   Reports  Headache  As  welll -  Pt  Also  Reports  Irritation in  Vaginal  Area  afetr  Using  Scented  Soaps     She  Reports  The  Symptoms  X  4  Days      She  Is  Speaking in  Complete  sentances  And  Is  In  No   Severe   Distress

## 2011-10-16 NOTE — ED Provider Notes (Signed)
History     CSN: [id number]  Arrival date & time 10/16/11  1434   First MD Initiated Contact with Patient 10/16/11 1504      Chief Complaint  Patient presents with  . Sore Throat    (Consider location/radiation/quality/duration/timing/severity/associated sxs/prior treatment) HPI Comments: Patient presents with 2 complaints: First, she reports sore throat, nasal congestion, postnasal drip, raspy voice, sensation of her throat swelling shut starting the past several days. No nausea, vomiting, fevers, drooling, trismus. No coughing, wheezing, chest pain, shortness of breath. No abdominal pain, excessive fatigue, rash. No reflux symptoms. She is taking Claritin and ibuprofen with some improvement.  Second, patient reports vaginal irritation, vaginal burning, dysuria after using [place] soap 4 days ago. No genital rash, vaginal odor, discharge, urinary urgency, frequency, oderous or cloudy urine. No abdominal pain, back pain. She is sexually active with a single female partner of 6 years, who is asymptomatic. She denies having oral sex recently. She has a history of BV and trichomonas. No history of gonorrhea, Chlamydia, herpes, HIV, syphilis, yeast infections.  Patient is a 26 y.o. female presenting with pharyngitis and vaginal itching. The history is provided by the patient. No language interpreter was used.  Sore Throat This is a new problem. The current episode started more than 2 days ago. The problem occurs constantly. The problem has not changed since onset.Associated symptoms include headaches. Pertinent negatives include no chest pain, no abdominal pain and no shortness of breath. The symptoms are aggravated by swallowing. The symptoms are relieved by NSAIDs.  Vaginal Itching This is a new problem. The current episode started more than 2 days ago. The problem has not changed since onset.Associated symptoms include headaches. Pertinent negatives include no chest pain, no abdominal pain  and no shortness of breath. Nothing aggravates the symptoms. Nothing relieves the symptoms. She has tried nothing for the symptoms. The treatment provided no relief.    Past Medical History  Diagnosis Date  . No pertinent past medical history   . Strep throat   . BV (bacterial vaginosis)   . Trichomonas   . Obesity   . Ectopic pregnancy     Past Surgical History  Procedure Date  . Salpingectomy     Family History  Problem Relation Age of Onset  . Heart disease Mother   . Diabetes Mother   . Diabetes Father   . Heart disease Father   . Anesthesia problems Neg Hx   . Hypotension Neg Hx   . Malignant hyperthermia Neg Hx   . Pseudochol deficiency Neg Hx     History  Substance Use Topics  . Smoking status: Never Smoker   . Smokeless tobacco: Not on file  . Alcohol Use: No    OB History    Grav Para Term Preterm Abortions TAB SAB Ect Mult Living   1 0 0 0 1 0 0 1 0 0       Review of Systems  Respiratory: Negative for shortness of breath.   Cardiovascular: Negative for chest pain.  Gastrointestinal: Negative for abdominal pain.  Neurological: Positive for headaches.    Allergies  Review of patient's allergies indicates no known allergies.  Home Medications   Current Outpatient Rx  Name Route Sig Dispense Refill  . IBUPROFEN 400 MG PO TABS Oral Take 400 mg by mouth every 6 (six) hours as needed.    Marland Kitchen LORATADINE 10 MG PO TABS Oral Take 1 tablet (10 mg total) by mouth daily. 10 tablet 0  .  PRENATAL MULTIVITAMIN CH Oral Take 1 tablet by mouth daily.      BP 115/65  Pulse 73  Temp 98.1 F (36.7 C) (Oral)  Resp 20  SpO2 100%  LMP 08/22/2011  Physical Exam  Nursing note and vitals reviewed. Constitutional: She is oriented to person, place, and time. She appears well-developed and well-nourished. No distress.  HENT:  Head: Normocephalic and atraumatic. No trismus in the jaw.  Right Ear: Tympanic membrane normal.  Left Ear: Tympanic membrane normal.  Nose:  Nose normal.  Mouth/Throat: Uvula is midline and mucous membranes are normal. No uvula swelling. Oropharyngeal exudate and posterior oropharyngeal erythema present. No tonsillar abscesses.       Erythematous, enlarged tonsils with exudates bilaterally.  Eyes: Conjunctivae and EOM are normal.  Neck: Normal range of motion.  Cardiovascular: Normal rate.   Pulmonary/Chest: Effort normal.  Abdominal: She exhibits no distension.  Genitourinary: Uterus normal. Pelvic exam was performed with patient supine. There is no rash, tenderness or lesion on the right labia. There is no rash, tenderness or lesion on the left labia. Cervix exhibits no motion tenderness and no friability. Right adnexum displays no mass, no tenderness and no fullness. Left adnexum displays no mass and no tenderness. No erythema, tenderness or bleeding around the vagina. No foreign body around the vagina. No signs of injury around the vagina. Vaginal discharge found.       External labia normal. thin white nonodorous vaginal discharge. Chaperone present during exam  Musculoskeletal: Normal range of motion.  Lymphadenopathy:    She has no cervical adenopathy.  Neurological: She is alert and oriented to person, place, and time. Coordination normal.  Skin: Skin is warm and dry.  Psychiatric: She has a normal mood and affect. Her behavior is normal. Judgment and thought content normal.    ED Course  Procedures (including critical care time)  Labs Reviewed  POCT URINALYSIS DIP (DEVICE) - Abnormal; Notable for the following:    Hgb urine dipstick TRACE (*)     Leukocytes, UA TRACE (*)  Biochemical Testing Only. Please order routine urinalysis from main lab if confirmatory testing is needed.   All other components within normal limits  POCT RAPID STREP A ([facility] ONLY)  POCT PREGNANCY, URINE  STREP A DNA PROBE  WET PREP, GENITAL  GC/CHLAMYDIA PROBE AMP, GENITAL   No results found.   1. Tonsillitis with exudate   2.  Vaginitis    Results for orders placed during the hospital encounter of 10/16/11  POCT RAPID STREP A ([facility] ONLY)      Component Value Range   Streptococcus, Group A Screen (Direct) NEGATIVE  NEGATIVE  POCT URINALYSIS DIP (DEVICE)      Component Value Range   Glucose, UA NEGATIVE  NEGATIVE mg/dL   Bilirubin Urine NEGATIVE  NEGATIVE   Ketones, ur NEGATIVE  NEGATIVE mg/dL   Specific Gravity, Urine 1.020  1.005 - 1.030   Hgb urine dipstick TRACE (*) NEGATIVE   pH 5.5  5.0 - 8.0   Protein, ur NEGATIVE  NEGATIVE mg/dL   Urobilinogen, UA 0.2  0.0 - 1.0 mg/dL   Nitrite NEGATIVE  NEGATIVE   Leukocytes, UA TRACE (*) NEGATIVE  POCT PREGNANCY, URINE      Component Value Range   Preg Test, Ur NEGATIVE  NEGATIVE  WET PREP, GENITAL      Component Value Range   Yeast Wet Prep HPF POC NONE SEEN  NONE SEEN   Trich, Wet Prep NONE  SEEN  NONE SEEN   Clue Cells Wet Prep HPF POC MODERATE (*) NONE SEEN   WBC, Wet Prep HPF POC FEW (*) NONE SEEN     MDM   Rapid strep negative. Obtaining throat culture to guide antibiotic results.  Discussed this with patient. We'll contact her if culture is positive, and will call in Appropriate antibiotics. Patient home with ibuprofen, Tylenol. Patient to followup with PMD of choice if necessary.  Udip noted. This is most likely from a vaginitis rather than a UTI.  H&P most c/w  vaginitis, however, given her history of BV, normal external labia, and and the vaginal discharge found exam, will treat empirically for BV. Sent off GC/chlamydia, wet prep. Will not treat empirically now for STI. Will send home with flagyl . Advised pt to refrain from sexual contact until she he knows lab results, symptoms resolve, and partner(s) are treated. Pt provided working phone number. Pt agrees.       Luiz Blare, MD 10/16/11 2155

## 2011-10-17 LAB — STREP A DNA PROBE: Group A Strep Probe: NEGATIVE

## 2011-10-17 MED ORDER — CEFTRIAXONE SODIUM 250 MG IJ SOLR: 250.0000 mg | Freq: Once | INTRAMUSCULAR | Status: AC

## 2011-10-17 NOTE — ED Notes (Signed)
Call from pharmacy; advised this is a future order , and pt should return to clinic for her rocephin shot

## 2011-10-18 ENCOUNTER — Emergency Department (INDEPENDENT_AMBULATORY_CARE_PROVIDER_SITE_OTHER)
Admission: EM | Admit: 2011-10-18 | Discharge: 2011-10-18 | Disposition: A | Discharge status: Home / Self Care | Payer: BC Managed Care – PPO | Source: Home / Self Care | Attending: Family Medicine | Admitting: Family Medicine

## 2011-10-18 ENCOUNTER — Encounter (HOSPITAL_COMMUNITY): Payer: Self-pay | Admitting: *Deleted

## 2011-10-18 DIAGNOSIS — A5400 Gonococcal infection of lower genitourinary tract, unspecified: Secondary | ICD-10-CM

## 2011-10-18 DIAGNOSIS — A568 Sexually transmitted chlamydial infection of other sites: Secondary | ICD-10-CM

## 2011-10-18 MED ORDER — CEFTRIAXONE SODIUM 250 MG IJ SOLR
INTRAMUSCULAR | Status: AC
  Filled 2011-10-18: qty 250

## 2011-10-18 MED ORDER — CEFTRIAXONE SODIUM 250 MG IJ SOLR
250.0000 mg | Freq: Once | INTRAMUSCULAR | Status: AC
  Administered 2011-10-18: 250 mg via INTRAMUSCULAR

## 2011-10-18 MED ORDER — LIDOCAINE HCL (PF) 1 % IJ SOLN
INTRAMUSCULAR | Status: AC
  Filled 2011-10-18: qty 5

## 2011-10-18 NOTE — ED Notes (Signed)
Here for Rocephin injection for GC.  Pt. said she did not fill the Flonase or the Ibuprofen. States she had some Ibuprofen at home.  Still having throat problems.

## 2011-10-18 NOTE — ED Notes (Signed)
Pt. notified she would have to wait 15 min. after shot to make sure she does not have an allergic reaction. Pt. told what to watch for- rash like hives, itching, swelling in throat and SOB.

## 2012-01-01 ENCOUNTER — Encounter: Payer: Self-pay | Admitting: Obstetrics and Gynecology

## 2012-01-01 ENCOUNTER — Ambulatory Visit (INDEPENDENT_AMBULATORY_CARE_PROVIDER_SITE_OTHER): Payer: BC Managed Care – PPO | Admitting: Obstetrics and Gynecology

## 2012-01-01 VITALS — BP 127/82 | HR 86 | Temp 98.5°F | Ht 65.0 in | Wt 279.1 lb

## 2012-01-01 DIAGNOSIS — Z113 Encounter for screening for infections with a predominantly sexual mode of transmission: Secondary | ICD-10-CM

## 2012-01-01 LAB — POCT PREGNANCY, URINE: Preg Test, Ur: NEGATIVE

## 2012-01-01 NOTE — Progress Notes (Signed)
Patient ID: Regina Watkins, female   DOB: 05/11/1985, 26 y.o.   MRN: [id number] 26 yo G1P0010 presents today requesting STD testing. Patient recently re-united with an ex-boyfriend and a condom break occurred during intercourse. Patient desires to be tested for STD. Patient does not desire any birth control as she is trying to seek pregnancy. Patient declined HIV, hep B, C, RPR testing.   Pelvic: Normal vaginal mucosa and cervix, no abnormal bleeding or discharge.   A/P 26 yo G1P0010 here for STD testing - wet prep and gc/ch collected - Patient declined other STD testing - Patient advised to take prenatal vitamins

## 2012-01-02 ENCOUNTER — Telehealth: Payer: Self-pay | Admitting: *Deleted

## 2012-01-02 LAB — WET PREP, GENITAL
Clue Cells Wet Prep HPF POC: NONE SEEN
Trich, Wet Prep: NONE SEEN

## 2012-01-02 MED ORDER — CEFTRIAXONE SODIUM 1 G IJ SOLR: 250.0000 mg | Freq: Once | INTRAMUSCULAR | Status: DC

## 2012-01-02 MED ORDER — AZITHROMYCIN 500 MG PO TABS: 1000.0000 mg | ORAL_TABLET | Freq: Once | ORAL | Status: DC

## 2012-01-02 NOTE — Telephone Encounter (Signed)
Pt informed of results. STD form filled out and faxed to [facility]. Pt will come in tomorrow to be treated at 1100.

## 2012-01-02 NOTE — Telephone Encounter (Signed)
Message copied by Mannie Stabile on Thu Jan 02, 2012  3:44 PM ------      Message from: CONSTANT, PEGGY      Created: Thu Jan 02, 2012  2:34 PM       Please inform patient of positive gonorrhea culture and need to come in for treatment.            She needs to receive ceftriaxone IM 250 mg and I will e-prescribe azithromycin 1 gm.            Please inform to abstain from intercourse with partner until he also gets treated.            Thanks            [employer]

## 2012-01-02 NOTE — Addendum Note (Signed)
Addended by: Catalina Antigua on: 01/02/2012 02:42 PM   Modules accepted: Orders

## 2012-01-03 ENCOUNTER — Ambulatory Visit (INDEPENDENT_AMBULATORY_CARE_PROVIDER_SITE_OTHER): Payer: BC Managed Care – PPO | Admitting: *Deleted

## 2012-01-03 VITALS — BP 121/80 | HR 79 | Wt 279.0 lb

## 2012-01-03 DIAGNOSIS — A5400 Gonococcal infection of lower genitourinary tract, unspecified: Secondary | ICD-10-CM

## 2012-01-03 DIAGNOSIS — A549 Gonococcal infection, unspecified: Secondary | ICD-10-CM

## 2012-01-03 MED ORDER — CEFTRIAXONE SODIUM 1 G IJ SOLR
250.0000 mg | Freq: Once | INTRAMUSCULAR | Status: AC
  Administered 2012-01-03: 250 mg via INTRAMUSCULAR

## 2012-01-03 MED ORDER — AZITHROMYCIN 250 MG PO TABS
1000.0000 mg | ORAL_TABLET | Freq: Once | ORAL | Status: AC
  Administered 2012-01-03: 1000 mg via ORAL

## 2012-01-23 ENCOUNTER — Telehealth: Payer: Self-pay

## 2012-01-23 NOTE — Telephone Encounter (Signed)
Pt called and did not leave a reason.  Called pt and stated to that she has been on her period for 3 weeks.  In the beginning, she states that she change her pad x 4 now its only 2 pads a day. I explained to the pt that the STD that she continues to contact can cause irregularity in her periods so to please give herself a couple weeks and if there no changes to bleeding to please give [place] a call.  Pt stated that she was going to call to make an appt anyway  for a test of cure.  Pt had no further questions.

## 2012-02-14 ENCOUNTER — Ambulatory Visit: Payer: BC Managed Care – PPO | Admitting: Obstetrics & Gynecology

## 2012-03-06 ENCOUNTER — Encounter: Payer: Self-pay | Admitting: Obstetrics and Gynecology

## 2012-03-06 ENCOUNTER — Ambulatory Visit (INDEPENDENT_AMBULATORY_CARE_PROVIDER_SITE_OTHER): Payer: BC Managed Care – PPO | Admitting: Obstetrics and Gynecology

## 2012-03-06 VITALS — BP 134/80 | HR 78 | Temp 96.9°F | Ht 65.0 in | Wt 281.1 lb

## 2012-03-06 DIAGNOSIS — R109 Unspecified abdominal pain: Secondary | ICD-10-CM

## 2012-03-06 DIAGNOSIS — R350 Frequency of micturition: Secondary | ICD-10-CM

## 2012-03-06 DIAGNOSIS — Z309 Encounter for contraceptive management, unspecified: Secondary | ICD-10-CM

## 2012-03-06 DIAGNOSIS — Z8619 Personal history of other infectious and parasitic diseases: Secondary | ICD-10-CM

## 2012-03-06 LAB — POCT URINALYSIS DIP (DEVICE)
Glucose, UA: NEGATIVE mg/dL
Specific Gravity, Urine: 1.02 (ref 1.005–1.030)
Urobilinogen, UA: 0.2 mg/dL (ref 0.0–1.0)

## 2012-03-06 MED ORDER — NORGESTIM-ETH ESTRAD TRIPHASIC 0.18/0.215/0.25 MG-35 MCG PO TABS: 1.0000 | ORAL_TABLET | Freq: Every day | ORAL | Status: DC

## 2012-03-06 NOTE — Patient Instructions (Signed)
Oral Contraception Use Oral contraceptives (OCs) are medicines taken to prevent pregnancy. OCs work by preventing the ovaries from releasing eggs. The hormones in OCs also cause the cervical mucus to thicken, preventing the sperm from entering the uterus. The hormones also cause the uterine lining to become thin, not allowing a fertilized egg to attach to the inside of the uterus. OCs are highly effective when taken exactly as prescribed. However, OCs do not prevent sexually transmitted diseases (STDs). Safe sex practices, such as using condoms along with an OC, can help prevent STDs.  Before taking OCs, you may have a physical exam and Pap test. Your caregiver may also order blood tests if necessary. Your caregiver will make sure you are a good candidate for oral contraception. Discuss with your caregiver the possible side effects of the OC you may be prescribed. When starting an OC, it can take 2 to 3 months for the body to adjust to the changes in hormone levels in your body.  HOW TO TAKE ORAL CONTRACEPTIVES Your caregiver may advise you on how to start taking the first cycle of OCs. Otherwise, you can:  Start on day 1 of your menstrual period. You will not need any backup contraceptive protection with this start time.  Start on the first Sunday after your menstrual period or the day you get your prescription. In these cases, you will need to use backup contraceptive protection for the first 7-day cycle. After you have started taking OCs:  If you forget to take 1 pill, take it as soon as you remember. Take the next pill at the regular time.  If you miss 2 or more pills, use backup birth control until your next menstrual period starts.  If you use a 28-day pack that contains inactive pills and you miss 1 of the last 7 pills (pills with no hormones), it will not matter. Throw away the rest of the non-hormone pills and start a new pill pack. No matter which day you start the OC, you will always start  a new pack on that same day of the week. Have an extra pack of OCs and a backup contraceptive method available in case you miss some pills or lose your OC pack. HOME CARE INSTRUCTIONS   Do not smoke.  Always use a condom to protect against STDs. OCs do not protect against STDs.  Use a calendar to mark your menstrual period days.  Read the information and directions that come with your OC. Talk to your caregiver if you have questions. SEEK MEDICAL CARE IF:   You develop nausea and vomiting.  You have abnormal vaginal discharge or bleeding.  You develop a rash.  You miss your menstrual period.  You are losing your hair.  You need treatment for mood swings or depression.  You get dizzy when taking the OC.  You develop acne from taking the OC.  You become pregnant. SEEK IMMEDIATE MEDICAL CARE IF:   You develop chest pain.  You develop shortness of breath.  You have an uncontrolled or severe headache.  You develop numbness or slurred speech.  You develop visual problems.  You develop pain, redness, and swelling in the legs. Document Released: 02/07/2011 Document Revised: 05/13/2011 Document Reviewed: 02/07/2011 Exit[employer] Patient Information 2013 [employer].  

## 2012-03-06 NOTE — Progress Notes (Signed)
  Subjective:    Patient ID: Regina Watkins, female    DOB: [birth year], 27 y.o.   MRN: [id number]  HPI Regina Watkins is a 27 yo G1P0010 here for contraception and urinary frequency with abdominal pain.  1. Abdominal pain/frequency: She reports having continued urinary urgency and frequency with dysuria and suprapubic abdominal pain.  No hematuria, flank pain, fevers, n/v/d.  Denies vaginal discharge or itching.  She was recently treated for GC/Chlamydia after a positive GC test.  She denies any sexual activity since her last appointment.  2. Contraception: She would like to get pregnant, but her cycles are irregular and she would like to lose some weight first.  She is a never smoker, no personal or family history or blood clots.  She does have migraines, but without aura.  Was on Yaz in the past without any problems.  LMP 01/17/2012.  Periods are lasting about 2 weeks with heavy bleeding for the first week.  No cramping.  I have reviewed and updated the following as appropriate: allergies, current medications, past family history, past medical history and past social history PMHx: h/o gonorrhea 01/02/2012  FHx: diabetes, HTN SHx: never smoker  Review of Systems See HPI    Objective:   Physical Exam BP 134/80  Pulse 78  Temp 96.9 F (36.1 C) (Oral)  Ht 5\' 5"  (1.651 m)  Wt 281 lb 1.6 oz (127.506 kg)  BMI 46.78 kg/m2  LMP 01/17/2012 Gen: alert, cooperative, NAD, obese HEENT: AT/, sclera white, MMM Neck: supple, no LAD CV: RRR, no murmurs Pulm: CTAB, no wheezes or rales Abd: +BS, soft, ND, tender in suprapubic region Pelvic: normal external genitalia; normal vaginal mucosa; small amount of thick white discharge; normal cervix; bimanual exam without CMT, mild tenderness on palpation of uterus, no adnexal masses Ext: no edema, 2+ DP pulses bilaterally      Assessment & Plan:  Urinary frequency and abdominal pain: UA with small Hgb and small LE.  Will send urine for  culture.  Also collected test of cure for GC/Chlamydia.  Will call patient with results and treat appropriately.  If still gonorrhea positive, will need to consider it resistant to ceftriaxone and treat with alternative agent such as cefoxitin. Contraception: Urine pregnancy test negative.  Discussed options extensively with patient.  Given her desire for pregnancy in the not too distant future and obesity, will use OCPs.  Prescription for Tri-Sprintec sent to [facility].  Discussed risk of blood clot with patient; this is very low risk given her age and non-smoker.  Warned her that OCPs may make headaches better, worse, or no change.  Evaluation and management procedures were performed by Resident physician under my supervision/collaboration. Chart reviewed, patient examined by me and I agree with management and plan. Danae Orleans, CNM 03/09/2012 8:26 AM

## 2012-03-08 LAB — URINE CULTURE

## 2012-03-16 ENCOUNTER — Telehealth: Payer: Self-pay | Admitting: *Deleted

## 2012-03-16 NOTE — Telephone Encounter (Signed)
Pt informed of her results

## 2012-03-16 NOTE — Telephone Encounter (Signed)
Pt called requesting results. 

## 2012-07-23 ENCOUNTER — Ambulatory Visit: Payer: BC Managed Care – PPO | Admitting: Obstetrics and Gynecology

## 2012-08-18 ENCOUNTER — Encounter (HOSPITAL_COMMUNITY): Payer: Self-pay | Admitting: Obstetrics and Gynecology

## 2012-08-18 ENCOUNTER — Inpatient Hospital Stay (HOSPITAL_COMMUNITY)
Admission: AD | Admit: 2012-08-18 | Discharge: 2012-08-19 | Disposition: A | Discharge status: Home / Self Care | Payer: BC Managed Care – PPO | Source: Ambulatory Visit | Attending: Family Medicine | Admitting: Family Medicine

## 2012-08-18 DIAGNOSIS — N949 Unspecified condition associated with female genital organs and menstrual cycle: Secondary | ICD-10-CM | POA: Insufficient documentation

## 2012-08-18 DIAGNOSIS — B9689 Other specified bacterial agents as the cause of diseases classified elsewhere: Secondary | ICD-10-CM | POA: Insufficient documentation

## 2012-08-18 DIAGNOSIS — L293 Anogenital pruritus, unspecified: Secondary | ICD-10-CM | POA: Insufficient documentation

## 2012-08-18 DIAGNOSIS — A499 Bacterial infection, unspecified: Secondary | ICD-10-CM

## 2012-08-18 DIAGNOSIS — N760 Acute vaginitis: Secondary | ICD-10-CM

## 2012-08-18 LAB — URINALYSIS, ROUTINE W REFLEX MICROSCOPIC
Glucose, UA: NEGATIVE mg/dL
Leukocytes, UA: NEGATIVE
Nitrite: NEGATIVE
Protein, ur: NEGATIVE mg/dL
Urobilinogen, UA: 0.2 mg/dL (ref 0.0–1.0)

## 2012-08-18 LAB — WET PREP, GENITAL

## 2012-08-18 MED ORDER — FLUCONAZOLE 150 MG PO TABS: 150.0000 mg | ORAL_TABLET | Freq: Once | ORAL | Status: DC

## 2012-08-18 MED ORDER — METRONIDAZOLE 500 MG PO TABS: 500.0000 mg | ORAL_TABLET | Freq: Two times a day (BID) | ORAL | Status: DC

## 2012-08-18 NOTE — MAU Provider Note (Signed)
History     CSN: [id number]  Arrival date and time: 08/18/12 8469   First Provider Initiated Contact with Patient 08/18/12 2215      Chief Complaint  Patient presents with  . Emesis  . Vaginal Pain   HPI pt is not pregnant and states she was vomiting and had diarrhea Sunday night until last night- last night she did not have any diarrhea and has not been vomiting today.  She used a friend's Dial soap on Sat then had some vaginal itching and a d/c with odor- pt states she had this before with a bacterial vaginal infection.  Pt missed 2 BCP and spotting. Note- pt is obese and states she has chaffing with friction of inner thighs  Nurses note:Pt reports vomiting since last pm. Denies diarrhea. Vaginal irritation, burning with urination. Spotting. LMP 07/17/2012   Past Medical History  Diagnosis Date  . No pertinent past medical history   . Strep throat   . BV (bacterial vaginosis)   . Trichomonas   . Obesity   . Ectopic pregnancy     Past Surgical History  Procedure Laterality Date  . Salpingectomy      Family History  Problem Relation Age of Onset  . Heart disease Mother   . Diabetes Mother   . Diabetes Father   . Heart disease Father   . Anesthesia problems Neg Hx   . Hypotension Neg Hx   . Malignant hyperthermia Neg Hx   . Pseudochol deficiency Neg Hx     History  Substance Use Topics  . Smoking status: Never Smoker   . Smokeless tobacco: Not on file  . Alcohol Use: No    Allergies: No Known Allergies  Prescriptions prior to admission  Medication Sig Dispense Refill  . Norgestimate-Ethinyl Estradiol Triphasic (TRI-SPRINTEC) 0.18/0.215/0.25 MG-35 MCG tablet Take 1 tablet by mouth daily.  1 Package  11    ROS Physical Exam   Blood pressure 123/80, pulse 92, temperature 98.2 F (36.8 C), temperature source Oral, resp. rate 18, height 5\' 5"  (1.651 m), weight 128.822 kg (284 lb), last menstrual period 07/17/2012, SpO2 98.00%.  Physical Exam  Vitals  reviewed. Constitutional: She is oriented to person, place, and time. She appears well-developed and well-nourished.  HENT:  Head: Normocephalic.  Eyes: Pupils are equal, round, and reactive to light.  Neck: Normal range of motion. Neck supple.  Cardiovascular: Normal rate.   Respiratory: Effort normal.  GI: Soft. She exhibits no distension. There is no tenderness. There is no rebound and no guarding.  Genitourinary:  External exam without erythema, rash or lesions=Mod amount of frothy watery discharge; cervix clean NT; uterus and ovaries without palpable enlargement or tenderness.  Musculoskeletal: Normal range of motion.  Neurological: She is alert and oriented to person, place, and time.  Skin: Skin is warm and dry.  Psychiatric: She has a normal mood and affect.    MAU Course  Procedures Results for orders placed during the hospital encounter of 08/18/12 (from the past 24 hour(s))  URINALYSIS, ROUTINE W REFLEX MICROSCOPIC     Status: Abnormal   Collection Time    08/18/12  7:25 PM      Result Value Range   Color, Urine YELLOW  YELLOW   APPearance CLEAR  CLEAR   Specific Gravity, Urine >1.030 (*) 1.005 - 1.030   pH 5.5  5.0 - 8.0   Glucose, UA NEGATIVE  NEGATIVE mg/dL   Hgb urine dipstick MODERATE (*) NEGATIVE   Bilirubin Urine  NEGATIVE  NEGATIVE   Ketones, ur NEGATIVE  NEGATIVE mg/dL   Protein, ur NEGATIVE  NEGATIVE mg/dL   Urobilinogen, UA 0.2  0.0 - 1.0 mg/dL   Nitrite NEGATIVE  NEGATIVE   Leukocytes, UA NEGATIVE  NEGATIVE  URINE MICROSCOPIC-ADD ON     Status: Abnormal   Collection Time    08/18/12  7:25 PM      Result Value Range   Squamous Epithelial / LPF MANY (*) RARE   WBC, UA 0-2  <3 WBC/hpf   Bacteria, UA RARE  RARE  POCT PREGNANCY, URINE     Status: None   Collection Time    08/18/12  7:32 PM      Result Value Range   Preg Test, Ur NEGATIVE  NEGATIVE  WET PREP, GENITAL     Status: Abnormal   Collection Time    08/18/12 11:09 PM      Result Value Range    Yeast Wet Prep HPF POC NONE SEEN  NONE SEEN   Trich, Wet Prep NONE SEEN  NONE SEEN   Clue Cells Wet Prep HPF POC FEW (*) NONE SEEN   WBC, Wet Prep HPF POC FEW (*) NONE SEEN     Assessment and Plan  Bacterial vaginosis- Flagyl 500mg  Bd for 7 days Will give Diflucan 150mg  #2 one now and repeat in 72 hours if sx persist  LINEBERRY,SUSAN 08/18/2012, 10:18 PM

## 2012-08-18 NOTE — MAU Note (Signed)
Pt reports vomiting since last pm. Denies diarrhea. Vaginal irritation, burning with urination. Spotting. LMP 07/17/2012

## 2012-08-19 LAB — GC/CHLAMYDIA PROBE AMP: GC Probe RNA: NEGATIVE

## 2012-08-19 NOTE — MAU Provider Note (Signed)
Chart reviewed and agree with management and plan.  

## 2012-08-31 ENCOUNTER — Ambulatory Visit: Payer: BC Managed Care – PPO | Admitting: Advanced Practice Midwife

## 2012-09-10 ENCOUNTER — Inpatient Hospital Stay (HOSPITAL_COMMUNITY)
Admission: AD | Admit: 2012-09-10 | Discharge: 2012-09-10 | Disposition: A | Discharge status: Home / Self Care | Payer: BC Managed Care – PPO | Source: Ambulatory Visit | Attending: Obstetrics & Gynecology | Admitting: Obstetrics & Gynecology

## 2012-09-10 ENCOUNTER — Encounter (HOSPITAL_COMMUNITY): Payer: Self-pay | Admitting: *Deleted

## 2012-09-10 DIAGNOSIS — N390 Urinary tract infection, site not specified: Secondary | ICD-10-CM

## 2012-09-10 DIAGNOSIS — R300 Dysuria: Secondary | ICD-10-CM | POA: Insufficient documentation

## 2012-09-10 LAB — URINALYSIS, ROUTINE W REFLEX MICROSCOPIC
Glucose, UA: 100 mg/dL — AB
Ketones, ur: 15 mg/dL — AB
Nitrite: POSITIVE — AB
Protein, ur: 100 mg/dL — AB

## 2012-09-10 MED ORDER — CIPROFLOXACIN HCL 500 MG PO TABS: 500.0000 mg | ORAL_TABLET | Freq: Two times a day (BID) | ORAL | Status: DC

## 2012-09-10 NOTE — MAU Note (Signed)
Pt states she has not had intercourse since she was here last

## 2012-09-10 NOTE — MAU Note (Signed)
Pt states she was her on 08/19/2012, and had a STD check and was treated for BV. Pt states she missed taking 2 pills and feels like she may have made condition worse. Pt has burning on urination

## 2012-09-10 NOTE — MAU Provider Note (Signed)
History     CSN: [id number]  Arrival date and time: 09/10/12 1558   None     Chief Complaint  Patient presents with  . Dysuria   HPI Regina Watkins is 27 y.o. G1P0010 presents with burning with urination and frequency/small amounts X 2 days.  Denies fever or chills.  Took Azo yesterday with some relief.  She was seen in MAU on 6/17, dx with BV. Took all but last 2 pills of her flagyl--lost bottle while out of town.   States she has not had intercourse since that visit.    Past Medical History  Diagnosis Date  . No pertinent past medical history   . Strep throat   . BV (bacterial vaginosis)   . Trichomonas   . Obesity   . Ectopic pregnancy     Past Surgical History  Procedure Laterality Date  . Salpingectomy      Family History  Problem Relation Age of Onset  . Heart disease Mother   . Diabetes Mother   . Diabetes Father   . Heart disease Father   . Anesthesia problems Neg Hx   . Hypotension Neg Hx   . Malignant hyperthermia Neg Hx   . Pseudochol deficiency Neg Hx     History  Substance Use Topics  . Smoking status: Never Smoker   . Smokeless tobacco: Not on file  . Alcohol Use: No    Allergies: No Known Allergies  Prescriptions prior to admission  Medication Sig Dispense Refill  . fluconazole (DIFLUCAN) 150 MG tablet Take 1 tablet (150 mg total) by mouth once.  2 tablet  3  . metroNIDAZOLE (FLAGYL) 500 MG tablet Take 1 tablet (500 mg total) by mouth 2 (two) times daily.  14 tablet  0  . Norgestimate-Ethinyl Estradiol Triphasic (TRI-SPRINTEC) 0.18/0.215/0.25 MG-35 MCG tablet Take 1 tablet by mouth daily.  1 Package  11    Review of Systems  Constitutional: Negative for fever and chills.  Gastrointestinal: Negative for nausea and vomiting.  Genitourinary: Positive for dysuria, urgency and frequency. Negative for hematuria and flank pain.   Physical Exam   Blood pressure 120/66, temperature 97.5 F (36.4 C), temperature source Oral, resp. rate 18,  height 5' 5.5" (1.664 m), weight 280 lb 12.8 oz (127.37 kg), last menstrual period 08/21/2012.  Physical Exam  Constitutional: She is oriented to person, place, and time. She appears well-developed and well-nourished. No distress.  HENT:  Head: Normocephalic.  Neck: Normal range of motion.  Cardiovascular: Normal rate.   Respiratory: Effort normal.  GI: Soft. She exhibits no distension and no mass. There is no tenderness. There is no rebound and no guarding.  Genitourinary:  Negative for flank pain.   Pelvic exam not indicated  Neurological: She is alert and oriented to person, place, and time.  Skin: Skin is warm and dry.  Psychiatric: She has a normal mood and affect. Her behavior is normal.   Results for orders placed during the hospital encounter of 09/10/12 (from the past 24 hour(s))  URINALYSIS, ROUTINE W REFLEX MICROSCOPIC     Status: Abnormal   Collection Time    09/10/12  4:10 PM      Result Value Range   Color, Urine ORANGE (*) YELLOW   APPearance HAZY (*) CLEAR   Specific Gravity, Urine 1.020  1.005 - 1.030   pH 5.0  5.0 - 8.0   Glucose, UA 100 (*) NEGATIVE mg/dL   Hgb urine dipstick MODERATE (*) NEGATIVE   Bilirubin  Urine SMALL (*) NEGATIVE   Ketones, ur 15 (*) NEGATIVE mg/dL   Protein, ur 454 (*) NEGATIVE mg/dL   Urobilinogen, UA 4.0 (*) 0.0 - 1.0 mg/dL   Nitrite POSITIVE (*) NEGATIVE   Leukocytes, UA LARGE (*) NEGATIVE  URINE MICROSCOPIC-ADD ON     Status: Abnormal   Collection Time    09/10/12  4:10 PM      Result Value Range   Squamous Epithelial / LPF FEW (*) RARE   WBC, UA 21-50  <3 WBC/hpf   RBC / HPF 0-2  <3 RBC/hpf    MAU Course  Procedures  MDM   Assessment and Plan  A:  UTI      Afebrile  P:  Rx for Cipro 500mg  bid X 3 days      Increase po fluids      Follow up with MD of her choice if sxs persist after treatment  Talayla Doyel,EVE M 09/10/2012, 4:27 PM

## 2012-09-11 LAB — POCT PREGNANCY, URINE: Preg Test, Ur: NEGATIVE

## 2012-09-14 NOTE — MAU Provider Note (Signed)
Attestation of Attending Supervision of Advanced Practitioner (PA/CNM/NP): Evaluation and management procedures were performed by the Advanced Practitioner under my supervision and collaboration.  I have reviewed the Advanced Practitioner's note and chart, and I agree with the management and plan.  Lenoard Helbert, MD, FACOG Attending Obstetrician & Gynecologist Faculty Practice, [facility]  

## 2012-12-19 ENCOUNTER — Emergency Department (HOSPITAL_COMMUNITY)
Admission: EM | Admit: 2012-12-19 | Discharge: 2012-12-19 | Disposition: A | Discharge status: Home / Self Care | Payer: BC Managed Care – PPO | Attending: Emergency Medicine | Admitting: Emergency Medicine

## 2012-12-19 DIAGNOSIS — Z8742 Personal history of other diseases of the female genital tract: Secondary | ICD-10-CM | POA: Insufficient documentation

## 2012-12-19 DIAGNOSIS — IMO0002 Reserved for concepts with insufficient information to code with codable children: Secondary | ICD-10-CM | POA: Insufficient documentation

## 2012-12-19 DIAGNOSIS — Y939 Activity, unspecified: Secondary | ICD-10-CM | POA: Insufficient documentation

## 2012-12-19 DIAGNOSIS — Y990 Civilian activity done for income or pay: Secondary | ICD-10-CM | POA: Insufficient documentation

## 2012-12-19 DIAGNOSIS — Y929 Unspecified place or not applicable: Secondary | ICD-10-CM | POA: Insufficient documentation

## 2012-12-19 DIAGNOSIS — H538 Other visual disturbances: Secondary | ICD-10-CM | POA: Insufficient documentation

## 2012-12-19 DIAGNOSIS — Z8619 Personal history of other infectious and parasitic diseases: Secondary | ICD-10-CM | POA: Insufficient documentation

## 2012-12-19 DIAGNOSIS — Z8679 Personal history of other diseases of the circulatory system: Secondary | ICD-10-CM | POA: Insufficient documentation

## 2012-12-19 DIAGNOSIS — E669 Obesity, unspecified: Secondary | ICD-10-CM | POA: Insufficient documentation

## 2012-12-19 DIAGNOSIS — S0990XA Unspecified injury of head, initial encounter: Secondary | ICD-10-CM | POA: Insufficient documentation

## 2012-12-19 MED ORDER — KETOROLAC TROMETHAMINE 30 MG/ML IJ SOLN
30.0000 mg | Freq: Once | INTRAMUSCULAR | Status: AC
  Administered 2012-12-19: 30 mg via INTRAVENOUS
  Filled 2012-12-19: qty 1

## 2012-12-19 MED ORDER — SODIUM CHLORIDE 0.9 % IV BOLUS (SEPSIS)
1000.0000 mL | Freq: Once | INTRAVENOUS | Status: AC
  Administered 2012-12-19: 1000 mL via INTRAVENOUS

## 2012-12-19 MED ORDER — METOCLOPRAMIDE HCL 5 MG/ML IJ SOLN
5.0000 mg | Freq: Once | INTRAMUSCULAR | Status: AC
  Administered 2012-12-19: 5 mg via INTRAVENOUS
  Filled 2012-12-19: qty 2

## 2012-12-19 MED ORDER — DIPHENHYDRAMINE HCL 50 MG/ML IJ SOLN
25.0000 mg | Freq: Once | INTRAMUSCULAR | Status: AC
  Administered 2012-12-19: 25 mg via INTRAVENOUS
  Filled 2012-12-19: qty 1

## 2012-12-19 NOTE — ED Provider Notes (Signed)
CSN: [id number]     Arrival date & time 12/19/12  0036 History   First MD Initiated Contact with Patient 12/19/12 0040     Chief Complaint  Patient presents with  . Headache  . Blurred Vision   (Consider location/radiation/quality/duration/timing/severity/associated sxs/prior Treatment) HPI  This is a 27 year old female who presents with headache. Patient reports hitting her head one week ago on ice machine at work. She denies loss of consciousness at that time. Patient states "I have not on the back of my head." Since that time she has had headaches on and off. The headaches get better with BC powder.  Patient denies any nausea or vomiting. Patient reports blurry vision from both eyes when she is having her headaches. She denies any double vision or peripheral vision loss. She denies any focal weakness or numbness. She does have a history of migraines but has never had vision changes before. Patient denies any fever or neck stiffness.  Past Medical History  Diagnosis Date  . No pertinent past medical history   . Strep throat   . BV (bacterial vaginosis)   . Trichomonas   . Obesity   . Ectopic pregnancy    Past Surgical History  Procedure Laterality Date  . Salpingectomy     Family History  Problem Relation Age of Onset  . Heart disease Mother   . Diabetes Mother   . Diabetes Father   . Heart disease Father   . Anesthesia problems Neg Hx   . Hypotension Neg Hx   . Malignant hyperthermia Neg Hx   . Pseudochol deficiency Neg Hx    History  Substance Use Topics  . Smoking status: Never Smoker   . Smokeless tobacco: Not on file  . Alcohol Use: No   OB History   Grav Para Term Preterm Abortions TAB SAB Ect Mult Living   1 0 0 0 1 0 0 1 0 0      Review of Systems  Constitutional: Negative for fever.  Eyes: Positive for visual disturbance.  Gastrointestinal: Negative for abdominal pain.  Musculoskeletal: Negative for neck pain and neck stiffness.  Neurological: Positive  for headaches. Negative for dizziness and weakness.    Allergies  Review of patient's allergies indicates no known allergies.  Home Medications  No current outpatient prescriptions on file. BP 121/57  Pulse 84  Temp(Src) 98.3 F (36.8 C) (Oral)  Ht 5\' 5"  (1.651 m)  SpO2 100%  LMP 12/14/2012 Physical Exam  Nursing note and vitals reviewed. Constitutional: She is oriented to person, place, and time. She appears well-developed and well-nourished.  Obese  HENT:  Head: Normocephalic and atraumatic.  Mouth/Throat: Oropharynx is clear and moist.  Eyes: EOM are normal. Pupils are equal, round, and reactive to light.  Neck: Neck supple.  Cardiovascular: Normal rate, regular rhythm and normal heart sounds.   No murmur heard. Pulmonary/Chest: Effort normal and breath sounds normal. No respiratory distress. She has no wheezes.  Abdominal: Soft. Bowel sounds are normal. There is no tenderness.  Musculoskeletal: She exhibits no edema.  Neurological: She is alert and oriented to person, place, and time. She displays normal reflexes. No cranial nerve deficit.  Visual fields intact, visual acuity 20/70 out of one eye and 20/30 out of the other, 20/30 out of both, no dysmetria to finger-nose-finger  Skin: Skin is warm and dry.  Psychiatric: She has a normal mood and affect.    ED Course  Procedures (including critical care time) Labs Review Labs Reviewed - No  data to display Imaging Review No results found.  EKG Interpretation   None       MDM   1. Headache    This a 26 year old female who presents with headache. Patient reports onset of headache in the setting of minor head injury. She has no evidence of trauma to the head and has had no nausea or vomiting.  She did not have loss of consciousness. At this time I do not feel CT scanning is warranted. Patient was given migraine cocktail. Visual acuity is noted to be 20/30 and 20/70 in the other eye. Patient does not have a history of  wearing glasses.   3:30 AM Patient reports complete resolution of symptoms with migraine cocktail. Given the patient was neurologically intact with normal visual fields, my suspicion is this may be a simple migraine. Patient is obese and would be at risk for pseudotumor but given her history of migraines and onset of headache in the setting of minor head injury, further evaluation for pseudotumor will not be pursued at this time. I discussed this with the patient and she is to followup if she has worsening of headache, visual field deficits, or persistent headaches. Patient stated understanding.  Patient was also advised to followup with an eye doctor for ophthalmologic examination given her visual acuity testing.  After history, exam, and medical workup I feel the patient has been appropriately medically screened and is safe for discharge home. Pertinent diagnoses were discussed with the patient. Patient was given return precautions.     Shon Baton, MD 12/19/12 [phone]

## 2012-12-19 NOTE — ED Notes (Signed)
Blurred vision and headache.  Hit her head on ice machine a weak ago.

## 2013-02-18 ENCOUNTER — Inpatient Hospital Stay (HOSPITAL_COMMUNITY)
Admission: AD | Admit: 2013-02-18 | Discharge: 2013-02-18 | Disposition: A | Discharge status: Home / Self Care | Payer: BC Managed Care – PPO | Source: Ambulatory Visit | Attending: Obstetrics & Gynecology | Admitting: Obstetrics & Gynecology

## 2013-02-18 ENCOUNTER — Encounter (HOSPITAL_COMMUNITY): Payer: Self-pay | Admitting: *Deleted

## 2013-02-18 DIAGNOSIS — L293 Anogenital pruritus, unspecified: Secondary | ICD-10-CM | POA: Insufficient documentation

## 2013-02-18 DIAGNOSIS — A5901 Trichomonal vulvovaginitis: Secondary | ICD-10-CM | POA: Insufficient documentation

## 2013-02-18 LAB — WET PREP, GENITAL

## 2013-02-18 MED ORDER — METRONIDAZOLE 500 MG PO TABS: 500.0000 mg | ORAL_TABLET | Freq: Once | ORAL | Status: AC

## 2013-02-18 NOTE — MAU Note (Signed)
2-3 wks ago came on , was short. Started again last wk, went off on Sun.  Took some Amoxicillin for a toothache (500mg  x2)  Now is having a yellowish d/c.  Other people thought she might have a yeast inf.

## 2013-02-18 NOTE — MAU Provider Note (Signed)
CC: Vaginal Discharge    First Provider Initiated Contact with Patient 02/18/13 1844      HPI Regina Watkins is a 27 y.o. G1P0010 who presents with onset of yellowish vaginal discharge a couple weeks ago. Also has chronic vulvar itching. Was taking Amoxicillin for dental infection just before it started. On Tri-Sprintec but missed several dosages this past month and has had BTB.  LMP 12/10 was normal flow, but had 1-2 day spotting twice over the previous 2-3 wks.   Past Medical History  Diagnosis Date  . No pertinent past medical history   . Strep throat   . BV (bacterial vaginosis)   . Trichomonas   . Obesity   . Ectopic pregnancy     OB History  Gravida Para Term Preterm AB SAB TAB Ectopic Multiple Living  1 0 0 0 1 0 0 1 0 0     # Outcome Date GA Lbr Len/2nd Weight Sex Delivery Anes PTL Lv  1 ECT 04/22/07              Past Surgical History  Procedure Laterality Date  . Salpingectomy      History   Social History  . Marital Status: Single    Spouse Name: N/A    Number of Children: N/A  . Years of Education: N/A   Occupational History  . Not on file.   Social History Main Topics  . Smoking status: Never Smoker   . Smokeless tobacco: Not on file  . Alcohol Use: No  . Drug Use: No  . Sexual Activity: Yes    Birth Control/ Protection: None     Comment: monogamous rlship wiht female partner of 6 years   Other Topics Concern  . Not on file   Social History Narrative  . No narrative on file    Current Facility-Administered Medications on File Prior to Encounter  Medication Dose Route Frequency Provider Last Rate Last Dose  . cefTRIAXone (ROCEPHIN) injection 250 mg  250 mg Intramuscular Once Catalina Antigua, MD       No current outpatient prescriptions on file prior to encounter.    No Known Allergies  ROS Pertinent items in HPI  PHYSICAL EXAM Filed Vitals:   02/18/13 1758  BP: 111/66  Pulse: 81  Temp: 98.2 F (36.8 C)  Resp: 20   General:  Obesefemale in no acute distress Cardiovascular: Normal rate Respiratory: Normal effort Abdomen: Soft, nontender Back: No CVAT Extremities: No edema Neurologic: Alert and oriented Speculum exam: NEFG; vagina with thin white discharge, no blood; cervix clean Bimanual exam: cervix closed, no CMT; uterus NSSP; no adnexal tenderness or masses   LAB RESULTS Results for orders placed during the hospital encounter of 02/18/13 (from the past 24 hour(s))  POCT PREGNANCY, URINE     Status: None   Collection Time    02/18/13  6:08 PM      Result Value Range   Preg Test, Ur NEGATIVE  NEGATIVE  WET PREP, GENITAL     Status: Abnormal   Collection Time    02/18/13  6:50 PM      Result Value Range   Yeast Wet Prep HPF POC NONE SEEN  NONE SEEN   Trich, Wet Prep MODERATE (*) NONE SEEN   Clue Cells Wet Prep HPF POC FEW (*) NONE SEEN   WBC, Wet Prep HPF POC MANY (*) NONE SEEN    ASSESSMENT  1. Vaginitis due to Trichomonas     PLAN Discharge home. See  AVS for patient education. Advised abstinence or condoms until after next menses and take OCPs as directed.    Medication List         ibuprofen 200 MG tablet  Commonly known as:  ADVIL,MOTRIN  Take 400 mg by mouth once.     metroNIDAZOLE 500 MG tablet  Commonly known as:  FLAGYL  Take 1 tablet (500 mg total) by mouth once.     TRI-SPRINTEC PO  Take 1 tablet by mouth daily.            Follow-up Information   Schedule an appointment as soon as possible for a visit with [facility] [place]. (Call for GYN appointment and Pap)    Contact information:   [address] [place] [address] [phone]    Danae Orleans, CNM 02/18/2013 7:12 PM

## 2013-02-19 LAB — GC/CHLAMYDIA PROBE AMP: GC Probe RNA: NEGATIVE

## 2013-02-19 NOTE — MAU Provider Note (Signed)
Attestation of Attending Supervision of Advanced Practitioner (PA/CNM/NP): Evaluation and management procedures were performed by the Advanced Practitioner under my supervision and collaboration.  I have reviewed the Advanced Practitioner's note and chart, and I agree with the management and plan.  Tryniti Laatsch, MD, FACOG Attending Obstetrician & Gynecologist Faculty Practice, [facility]  

## 2013-03-01 ENCOUNTER — Telehealth (HOSPITAL_COMMUNITY): Payer: Self-pay

## 2013-03-15 ENCOUNTER — Ambulatory Visit: Payer: BC Managed Care – PPO | Admitting: Nurse Practitioner

## 2013-06-18 ENCOUNTER — Emergency Department (HOSPITAL_COMMUNITY)
Admission: EM | Admit: 2013-06-18 | Discharge: 2013-06-18 | Disposition: A | Discharge status: Home / Self Care | Payer: BC Managed Care – PPO | Attending: Emergency Medicine | Admitting: Emergency Medicine

## 2013-06-18 ENCOUNTER — Encounter (HOSPITAL_COMMUNITY): Payer: Self-pay | Admitting: Emergency Medicine

## 2013-06-18 ENCOUNTER — Emergency Department (HOSPITAL_COMMUNITY): Payer: BC Managed Care – PPO

## 2013-06-18 DIAGNOSIS — Z8619 Personal history of other infectious and parasitic diseases: Secondary | ICD-10-CM | POA: Insufficient documentation

## 2013-06-18 DIAGNOSIS — Y92410 Unspecified street and highway as the place of occurrence of the external cause: Secondary | ICD-10-CM | POA: Insufficient documentation

## 2013-06-18 DIAGNOSIS — S46909A Unspecified injury of unspecified muscle, fascia and tendon at shoulder and upper arm level, unspecified arm, initial encounter: Secondary | ICD-10-CM | POA: Insufficient documentation

## 2013-06-18 DIAGNOSIS — Y9389 Activity, other specified: Secondary | ICD-10-CM | POA: Insufficient documentation

## 2013-06-18 DIAGNOSIS — S4980XA Other specified injuries of shoulder and upper arm, unspecified arm, initial encounter: Secondary | ICD-10-CM | POA: Insufficient documentation

## 2013-06-18 DIAGNOSIS — E669 Obesity, unspecified: Secondary | ICD-10-CM | POA: Insufficient documentation

## 2013-06-18 DIAGNOSIS — M25511 Pain in right shoulder: Secondary | ICD-10-CM

## 2013-06-18 DIAGNOSIS — Z8742 Personal history of other diseases of the female genital tract: Secondary | ICD-10-CM | POA: Insufficient documentation

## 2013-06-18 MED ORDER — DIAZEPAM 5 MG PO TABS: 5.0000 mg | ORAL_TABLET | Freq: Two times a day (BID) | ORAL | Status: DC

## 2013-06-18 MED ORDER — NAPROXEN 500 MG PO TABS: 500.0000 mg | ORAL_TABLET | Freq: Two times a day (BID) | ORAL | Status: DC
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
## 2013-06-20 NOTE — ED Provider Notes (Signed)
Medical screening examination/treatment/procedure(s) were performed by non-physician practitioner and as supervising physician I was immediately available for consultation/collaboration.   EKG Interpretation None        Shelda JakesScott W. Dalyla Chui, MD 06/20/13 [phone]

## 2013-06-30 ENCOUNTER — Other Ambulatory Visit: Payer: Self-pay | Admitting: Emergency Medicine

## 2013-08-08 ENCOUNTER — Encounter (HOSPITAL_COMMUNITY): Payer: Self-pay

## 2013-08-08 ENCOUNTER — Inpatient Hospital Stay (HOSPITAL_COMMUNITY)
Admission: AD | Admit: 2013-08-08 | Discharge: 2013-08-08 | Disposition: A | Discharge status: Home / Self Care | Payer: BC Managed Care – PPO | Source: Ambulatory Visit | Attending: Obstetrics & Gynecology | Admitting: Obstetrics & Gynecology

## 2013-08-08 DIAGNOSIS — N390 Urinary tract infection, site not specified: Secondary | ICD-10-CM | POA: Insufficient documentation

## 2013-08-08 LAB — WET PREP, GENITAL
TRICH WET PREP: NONE SEEN
Yeast Wet Prep HPF POC: NONE SEEN

## 2013-08-08 LAB — URINALYSIS, ROUTINE W REFLEX MICROSCOPIC
Bilirubin Urine: NEGATIVE
GLUCOSE, UA: NEGATIVE mg/dL
Ketones, ur: NEGATIVE mg/dL
Nitrite: NEGATIVE
PROTEIN: NEGATIVE mg/dL
SPECIFIC GRAVITY, URINE: 1.01 (ref 1.005–1.030)
UROBILINOGEN UA: 0.2 mg/dL (ref 0.0–1.0)
pH: 6 (ref 5.0–8.0)

## 2013-08-08 LAB — POCT PREGNANCY, URINE: PREG TEST UR: NEGATIVE

## 2013-08-08 LAB — URINE MICROSCOPIC-ADD ON

## 2013-08-08 MED ORDER — ACETAMINOPHEN 325 MG PO TABS
650.0000 mg | ORAL_TABLET | Freq: Once | ORAL | Status: AC
  Administered 2013-08-08: 650 mg via ORAL
  Filled 2013-08-08: qty 2

## 2013-08-08 MED ORDER — CIPROFLOXACIN HCL 500 MG PO TABS
500.0000 mg | ORAL_TABLET | Freq: Once | ORAL | Status: AC
  Administered 2013-08-08: 500 mg via ORAL
  Filled 2013-08-08: qty 1

## 2013-08-08 MED ORDER — CIPROFLOXACIN HCL 500 MG PO TABS: 500.0000 mg | ORAL_TABLET | Freq: Two times a day (BID) | ORAL | Status: AC

## 2013-08-08 MED ORDER — KETOROLAC TROMETHAMINE 60 MG/2ML IM SOLN
60.0000 mg | Freq: Once | INTRAMUSCULAR | Status: AC
  Administered 2013-08-08: 60 mg via INTRAMUSCULAR
  Filled 2013-08-08: qty 2

## 2013-08-08 MED ORDER — ONDANSETRON 8 MG PO TBDP
8.0000 mg | ORAL_TABLET | Freq: Once | ORAL | Status: AC
  Administered 2013-08-08: 8 mg via ORAL
  Filled 2013-08-08: qty 1

## 2013-08-08 NOTE — Discharge Instructions (Signed)
Urinary Tract Infection  Urinary tract infections (UTIs) can develop anywhere along your urinary tract. Your urinary tract is your body's drainage system for removing wastes and extra water. Your urinary tract includes two kidneys, two ureters, a bladder, and a urethra. Your kidneys are a pair of bean-shaped organs. Each kidney is about the size of your fist. They are located below your ribs, one on each side of your spine.  CAUSES  Infections are caused by microbes, which are microscopic organisms, including fungi, viruses, and bacteria. These organisms are so small that they can only be seen through a microscope. Bacteria are the microbes that most commonly cause UTIs.  SYMPTOMS   Symptoms of UTIs may vary by age and gender of the patient and by the location of the infection. Symptoms in young women typically include a frequent and intense urge to urinate and a painful, burning feeling in the bladder or urethra during urination. Older women and men are more likely to be tired, shaky, and weak and have muscle aches and abdominal pain. A fever may mean the infection is in your kidneys. Other symptoms of a kidney infection include pain in your back or sides below the ribs, nausea, and vomiting.  DIAGNOSIS  To diagnose a UTI, your caregiver will ask you about your symptoms. Your caregiver also will ask to provide a urine sample. The urine sample will be tested for bacteria and white blood cells. White blood cells are made by your body to help fight infection.  TREATMENT   Typically, UTIs can be treated with medication. Because most UTIs are caused by a bacterial infection, they usually can be treated with the use of antibiotics. The choice of antibiotic and length of treatment depend on your symptoms and the type of bacteria causing your infection.  HOME CARE INSTRUCTIONS   If you were prescribed antibiotics, take them exactly as your caregiver instructs you. Finish the medication even if you feel better after you  have only taken some of the medication.   Drink enough water and fluids to keep your urine clear or pale yellow.   Avoid caffeine, tea, and carbonated beverages. They tend to irritate your bladder.   Empty your bladder often. Avoid holding urine for long periods of time.   Empty your bladder before and after sexual intercourse.   After a bowel movement, women should cleanse from front to back. Use each tissue only once.  SEEK MEDICAL CARE IF:    You have back pain.   You develop a fever.   Your symptoms do not begin to resolve within 3 days.  SEEK IMMEDIATE MEDICAL CARE IF:    You have severe back pain or lower abdominal pain.   You develop chills.   You have nausea or vomiting.   You have continued burning or discomfort with urination.  MAKE SURE YOU:    Understand these instructions.   Will watch your condition.   Will get help right away if you are not doing well or get worse.  Document Released: 11/28/2004 Document Revised: 08/20/2011 Document Reviewed: 03/29/2011  [employer] Patient Information 2014 [employer].

## 2013-08-08 NOTE — MAU Note (Signed)
Pt states Wednesday last week began noticing burning with voiding and pain in low back moreso on left side. Saw blood Monday when wiping post voiding. Pain is worse, constant, pt tearful, had to leave work today.

## 2013-08-08 NOTE — MAU Note (Signed)
Pt presents to MAU with complaints of lower abdominal and back pain that started last week and states the pain is getting worse. Painful urination and thinks she may have a UTI

## 2013-08-08 NOTE — MAU Provider Note (Signed)
History     CSN: [id number]  Arrival date and time: 08/08/13 1542   First Provider Initiated Contact with Patient 08/08/13 1621      Chief Complaint  Patient presents with  . Back Pain  . Abdominal Pain   HPI Comments: Regina Watkins 28 y.o. G1P0010 presents to MAU with symptoms of UTI since Wednesday. She has burning and some blood when she wipes. Her back and low pelvis hurt. She is tearful at encounter. She has fever. No new sexual partners.  Back Pain Associated symptoms include abdominal pain.  Abdominal Pain      Past Medical History  Diagnosis Date  . No pertinent past medical history   . Strep throat   . BV (bacterial vaginosis)   . Trichomonas   . Obesity   . Ectopic pregnancy     Past Surgical History  Procedure Laterality Date  . Salpingectomy      Family History  Problem Relation Age of Onset  . Heart disease Mother   . Diabetes Mother   . Diabetes Father   . Heart disease Father   . Anesthesia problems Neg Hx   . Hypotension Neg Hx   . Malignant hyperthermia Neg Hx   . Pseudochol deficiency Neg Hx     History  Substance Use Topics  . Smoking status: Never Smoker   . Smokeless tobacco: Never Used  . Alcohol Use: No    Allergies: No Known Allergies  Prescriptions prior to admission  Medication Sig Dispense Refill  . acetaminophen (TYLENOL) 325 MG tablet Take 650 mg by mouth every 6 (six) hours as needed for moderate pain or headache.      . ibuprofen (ADVIL,MOTRIN) 200 MG tablet Take 800 mg by mouth 2 (two) times daily as needed for headache or moderate pain.         Review of Systems  Gastrointestinal: Positive for abdominal pain.  Musculoskeletal: Positive for back pain.  Neurological: Negative.   Psychiatric/Behavioral: Negative.    Physical Exam   Blood pressure 130/88, pulse 112, temperature 102.7 F (39.3 C), resp. rate 18, height 5\' 3"  (1.6 m), weight 132.45 kg (292 lb), last menstrual period 05/08/2013.  Physical Exam   Constitutional: She is oriented to person, place, and time. She appears well-developed and well-nourished. She appears distressed.  Tearful with pain  HENT:  Head: Normocephalic and atraumatic.  Eyes: Pupils are equal, round, and reactive to light.  Cardiovascular: Normal rate, regular rhythm and normal heart sounds.   Respiratory: Effort normal and breath sounds normal.  GI: Soft. She exhibits no distension and no mass. There is tenderness. There is no rebound and no guarding.  Genitourinary:  Genital: External negative Vaginal:small amount white odorous discharge Cervix:closed/ thick Bimanual:negative   Musculoskeletal: She exhibits tenderness.  Left flank pain  Neurological: She is alert and oriented to person, place, and time.  Skin: Skin is warm and dry.  Psychiatric: She has a normal mood and affect. Her behavior is normal. Judgment and thought content normal.   Results for orders placed during the hospital encounter of 08/08/13 (from the past 24 hour(s))  URINALYSIS, ROUTINE W REFLEX MICROSCOPIC     Status: Abnormal   Collection Time    08/08/13  4:00 PM      Result Value Ref Range   Color, Urine YELLOW  YELLOW   APPearance HAZY (*) CLEAR   Specific Gravity, Urine 1.010  1.005 - 1.030   pH 6.0  5.0 - 8.0  Glucose, UA NEGATIVE  NEGATIVE mg/dL   Hgb urine dipstick LARGE (*) NEGATIVE   Bilirubin Urine NEGATIVE  NEGATIVE   Ketones, ur NEGATIVE  NEGATIVE mg/dL   Protein, ur NEGATIVE  NEGATIVE mg/dL   Urobilinogen, UA 0.2  0.0 - 1.0 mg/dL   Nitrite NEGATIVE  NEGATIVE   Leukocytes, UA LARGE (*) NEGATIVE  URINE MICROSCOPIC-ADD ON     Status: None   Collection Time    08/08/13  4:00 PM      Result Value Ref Range   Squamous Epithelial / LPF RARE  RARE   WBC, UA TOO NUMEROUS TO COUNT  <3 WBC/hpf   RBC / HPF 3-6  <3 RBC/hpf   Bacteria, UA RARE  RARE  POCT PREGNANCY, URINE     Status: None   Collection Time    08/08/13  4:09 PM      Result Value Ref Range   Preg Test,  Ur NEGATIVE  NEGATIVE  WET PREP, GENITAL     Status: Abnormal   Collection Time    08/08/13  5:05 PM      Result Value Ref Range   Yeast Wet Prep HPF POC NONE SEEN  NONE SEEN   Trich, Wet Prep NONE SEEN  NONE SEEN   Clue Cells Wet Prep HPF POC FEW (*) NONE SEEN   WBC, Wet Prep HPF POC FEW (*) NONE SEEN    MAU Course  Procedures  MDM Pt is driving self / Toradol 60 mg IM Tylenol for fever/ last recorded 98.7 axilla Zofran nausea Cipro first dose Urine culture  Assessment and Plan   A: UTI  P: Cipro 500 mg po bid x 7 days Increase fluids/ rest Needs to find OBGYN Note for work  HCA IncLinda M Demichael Traum 08/08/2013, 4:23 PM

## 2013-08-09 LAB — GC/CHLAMYDIA PROBE AMP
CT Probe RNA: NEGATIVE
GC Probe RNA: NEGATIVE

## 2013-08-10 LAB — URINE CULTURE: Colony Count: >=100000

## 2013-08-26 ENCOUNTER — Telehealth: Payer: Self-pay | Admitting: General Practice

## 2013-08-26 DIAGNOSIS — R350 Frequency of micturition: Secondary | ICD-10-CM

## 2013-08-26 DIAGNOSIS — Z8619 Personal history of other infectious and parasitic diseases: Secondary | ICD-10-CM

## 2013-08-26 DIAGNOSIS — Z3040 Encounter for surveillance of contraceptives, unspecified: Secondary | ICD-10-CM

## 2013-08-26 NOTE — Telephone Encounter (Signed)
Patient left message requesting lab results, patient left callback of [phone]. Called patient at that number, no answer- left message that we are trying to return your phone call, please call us back at the clinics

## 2013-08-27 ENCOUNTER — Other Ambulatory Visit: Payer: Self-pay | Admitting: Emergency Medicine

## 2013-08-27 MED ORDER — NORGESTIM-ETH ESTRAD TRIPHASIC 0.18/0.215/0.25 MG-35 MCG PO TABS: 1.0000 | ORAL_TABLET | Freq: Every day | ORAL | Status: DC

## 2013-08-27 NOTE — Telephone Encounter (Signed)
Called patient and informed her of normal results. Patient verbalized understanding and stated that her periods are back irregular again since she ran out of them and would like her Rx refilled and knows she needs to come in for an annual. Told patient we would send 3 months worth of refills until she can get annual appt. Patient verbalized understanding and had no questions

## 2013-09-02 ENCOUNTER — Encounter (HOSPITAL_COMMUNITY): Payer: Self-pay | Admitting: Emergency Medicine

## 2013-09-02 ENCOUNTER — Emergency Department (HOSPITAL_COMMUNITY)
Admission: EM | Admit: 2013-09-02 | Discharge: 2013-09-02 | Disposition: A | Discharge status: Home / Self Care | Payer: BC Managed Care – PPO | Attending: Emergency Medicine | Admitting: Emergency Medicine

## 2013-09-02 ENCOUNTER — Emergency Department (HOSPITAL_COMMUNITY): Payer: BC Managed Care – PPO

## 2013-09-02 DIAGNOSIS — W230XXA Caught, crushed, jammed, or pinched between moving objects, initial encounter: Secondary | ICD-10-CM | POA: Insufficient documentation

## 2013-09-02 DIAGNOSIS — S6991XA Unspecified injury of right wrist, hand and finger(s), initial encounter: Secondary | ICD-10-CM

## 2013-09-02 DIAGNOSIS — M79671 Pain in right foot: Secondary | ICD-10-CM

## 2013-09-02 DIAGNOSIS — Y939 Activity, unspecified: Secondary | ICD-10-CM | POA: Insufficient documentation

## 2013-09-02 DIAGNOSIS — S6990XA Unspecified injury of unspecified wrist, hand and finger(s), initial encounter: Secondary | ICD-10-CM | POA: Insufficient documentation

## 2013-09-02 DIAGNOSIS — E669 Obesity, unspecified: Secondary | ICD-10-CM | POA: Insufficient documentation

## 2013-09-02 DIAGNOSIS — Z8619 Personal history of other infectious and parasitic diseases: Secondary | ICD-10-CM | POA: Insufficient documentation

## 2013-09-02 DIAGNOSIS — M79609 Pain in unspecified limb: Secondary | ICD-10-CM | POA: Insufficient documentation

## 2013-09-02 DIAGNOSIS — S6980XA Other specified injuries of unspecified wrist, hand and finger(s), initial encounter: Secondary | ICD-10-CM | POA: Insufficient documentation

## 2013-09-02 DIAGNOSIS — Y929 Unspecified place or not applicable: Secondary | ICD-10-CM | POA: Insufficient documentation

## 2013-09-02 MED ORDER — TRAMADOL-ACETAMINOPHEN 37.5-325 MG PO TABS: 1.0000 | ORAL_TABLET | Freq: Four times a day (QID) | ORAL | Status: DC | PRN

## 2013-09-02 NOTE — ED Notes (Signed)
Pt states she is still having foot pain

## 2013-09-02 NOTE — ED Provider Notes (Signed)
CSN: [id number]     Arrival date & time 09/02/13  2040 History  This chart was scribed for non-physician practitioner, Garlon HatchetLisa M Kermit Arnette, PA-C, working with Rolan BuccoMelanie Belfi, MD, by Bronson CurbJacqueline Melvin, ED Scribe. This patient was seen in room TR06C/TR06C and the patient's care was started at 9:09 PM.     Chief Complaint  Patient presents with  . Foot Pain     The history is provided by the patient. No language interpreter was used.    HPI Comments: Regina Watkins is a 28 y.o. female who presents to the Emergency Department complaining of moderate right foot pain onset 6 months ago. She states she is unable to stand on her foot upon waking up in the morning. There is associated swelling. Patient reports the pain is worse when ambulating and weight bearing. Patient denies any specific injury or trauma to her foot. Patient works night shift at [employer] and states she is on her feet during her entire shift, but she reports her shifts are only 4 to 5 hours long. She reports that 2 years ago she was given a steroid injection in her foot, however, she states this did not relieve her pain. She denies fever, chills, CP, SOB, numbness, weakness, or paresthesias.  Patient also complains of right thumb injury that occurred 1 week ago when she smashed it in a door. There is associated mild tenderness. There are no obvious signs of injury.   Past Medical History  Diagnosis Date  . No pertinent past medical history   . Strep throat   . BV (bacterial vaginosis)   . Trichomonas   . Obesity   . Ectopic pregnancy    Past Surgical History  Procedure Laterality Date  . Salpingectomy     Family History  Problem Relation Age of Onset  . Heart disease Mother   . Diabetes Mother   . Diabetes Father   . Heart disease Father   . Anesthesia problems Neg Hx   . Hypotension Neg Hx   . Malignant hyperthermia Neg Hx   . Pseudochol deficiency Neg Hx    History  Substance Use Topics  . Smoking status: Never  Smoker   . Smokeless tobacco: Never Used  . Alcohol Use: No   OB History   Grav Para Term Preterm Abortions TAB SAB Ect Mult Living   1 0 0 0 1 0 0 1 0 0      Review of Systems  Constitutional: Negative for fever and chills.  Respiratory: Negative for shortness of breath.   Cardiovascular: Negative for chest pain.  Musculoskeletal:       Right foot pain Right thumb pain  Neurological: Negative for weakness and numbness.  All other systems reviewed and are negative.     Allergies  Review of patient's allergies indicates no known allergies.  Home Medications   Prior to Admission medications   Medication Sig Start Date End Date Taking? Authorizing Provider  acetaminophen (TYLENOL) 325 MG tablet Take 650 mg by mouth every 6 (six) hours as needed for moderate pain or headache.    Historical Provider, MD  ibuprofen (ADVIL,MOTRIN) 200 MG tablet Take 800 mg by mouth 2 (two) times daily as needed for headache or moderate pain.     Historical Provider, MD  Norgestimate-Ethinyl Estradiol Triphasic (TRI-SPRINTEC) 0.18/0.215/0.25 MG-35 MCG tablet Take 1 tablet by mouth daily. 08/27/13   Peggy Constant, MD   Triage Vitals: BP 151/85  Pulse 92  Temp(Src) 98.6 F (37 C) (Oral)  Resp 20  Ht 5\' 6"  (1.676 m)  Wt 299 lb (135.626 kg)  BMI 48.28 kg/m2  SpO2 97%  LMP 08/16/2013  Physical Exam  Nursing note and vitals reviewed. Constitutional: She is oriented to person, place, and time. She appears well-developed and well-nourished. No distress.  HENT:  Head: Normocephalic and atraumatic.  Mouth/Throat: Oropharynx is clear and moist.  Eyes: Conjunctivae and EOM are normal. Pupils are equal, round, and reactive to light.  Neck: Normal range of motion. Neck supple.  Cardiovascular: Normal rate, regular rhythm and normal heart sounds.   Pulmonary/Chest: Effort normal and breath sounds normal. No respiratory distress. She has no wheezes.  Musculoskeletal: Normal range of motion.       Right  hand: She exhibits tenderness and bony tenderness. She exhibits no deformity, no laceration and no swelling.       Hands:      Right foot: Normal.  Right foot exam WNL; NVI Right thumb with tenderness along lateral aspect of the IP joint; no gross bony deformities noted; full flexion and extension maintained; hand is neurovascularly intact  Neurological: She is alert and oriented to person, place, and time.  Skin: Skin is warm and dry. She is not diaphoretic.  Psychiatric: She has a normal mood and affect.    ED Course  Procedures (including critical care time)  DIAGNOSTIC STUDIES: Oxygen Saturation is 97% on room air, adequate by my interpretation.    COORDINATION OF CARE: At 2115 Discussed treatment plan with patient which includes imaging. Patient agrees.    Imaging Review Dg Finger Thumb Right  09/02/2013   CLINICAL DATA:  Interphalangeal joint pain of the thumb  EXAM: RIGHT THUMB 2+V  COMPARISON:  None.  FINDINGS: There is no evidence of fracture or dislocation. There is no evidence of arthropathy or other focal bone abnormality.  IMPRESSION: Negative.   Electronically Signed   By: Tiburcio PeaJonathan  Watts M.D.   On: 09/02/2013 22:05   Dg Foot Complete Right  09/02/2013   CLINICAL DATA:  Right foot pain, no known injury  EXAM: RIGHT FOOT COMPLETE - 3+ VIEW  COMPARISON:  11/27/2009  FINDINGS: No fracture or dislocation is seen.  Mild degenerative changes of the 1st MTP and IP joints.  Mild degenerative changes of the dorsal midfoot.  Small plantar and posterior calcaneal enthesophytes.  The visualized soft tissues are unremarkable.  IMPRESSION: No fracture or dislocation is seen.  Mild degenerative changes, as above.   Electronically Signed   By: Charline BillsSriyesh  Krishnan M.D.   On: 09/02/2013 22:05     EKG Interpretation None      MDM   Final diagnoses:  Foot pain, right  Thumb injury, right, initial encounter   X-ray thumb negative.  X-ray right foot revealing bone spurs, this is likely the  source of her pain as well as possible component of plantar fasciitis.  Patient will followup with orthopedics for further evaluation and management. Rx Ultracet for pain.  Discussed plan with patient, he/she acknowledged understanding and agreed with plan of care.  Return precautions given for new or worsening symptoms.  I personally performed the services described in this documentation, which was scribed in my presence. The recorded information has been reviewed and is accurate.  Garlon HatchetLisa M Cheresa Siers, PA-C 09/02/13 2241

## 2013-09-02 NOTE — Discharge Instructions (Signed)
Take the prescribed medication as directed. Follow-up with orthopedics for further evaluation/management. Return to the ED for new or worsening symptoms.

## 2013-09-02 NOTE — ED Notes (Signed)
Months of right foot pain, swelling. Denies specific injury. Denies chest pain or SOB. C/O Right thumb pain from smashing into door a week ago. No obvious signs of injury.

## 2013-09-02 NOTE — ED Provider Notes (Signed)
Medical screening examination/treatment/procedure(s) were performed by non-physician practitioner and as supervising physician I was immediately available for consultation/collaboration.   EKG Interpretation None        Novalee Horsfall, MD 09/02/13 2324 

## 2013-09-09 ENCOUNTER — Encounter: Payer: Self-pay | Admitting: Obstetrics and Gynecology

## 2013-10-21 ENCOUNTER — Ambulatory Visit (INDEPENDENT_AMBULATORY_CARE_PROVIDER_SITE_OTHER): Payer: BC Managed Care – PPO | Admitting: Obstetrics and Gynecology

## 2013-10-21 ENCOUNTER — Encounter: Payer: Self-pay | Admitting: Obstetrics and Gynecology

## 2013-10-21 VITALS — BP 120/61 | HR 73 | Temp 98.4°F | Ht 66.0 in | Wt 296.1 lb

## 2013-10-21 DIAGNOSIS — Z01419 Encounter for gynecological examination (general) (routine) without abnormal findings: Secondary | ICD-10-CM

## 2013-10-21 NOTE — Progress Notes (Signed)
  Subjective:     Regina Watkins is a 28 y.o. female G1P0010 with LMP 09/16/2013 who is here for a comprehensive physical exam. The patient reports no problems. She has been actively trying to conceive for the past 4 months. She is interested in having an ultrasound to evaluate her tube. Patient had a salpingectomy for the treatment of a right ectopic pregnancy.  History   Social History  . Marital Status: Single    Spouse Name: N/A    Number of Children: N/A  . Years of Education: N/A   Occupational History  . Not on file.   Social History Main Topics  . Smoking status: Never Smoker   . Smokeless tobacco: Never Used  . Alcohol Use: No  . Drug Use: No  . Sexual Activity: Not Currently    Birth Control/ Protection: None     Comment: monogamous rlship wiht female partner of 6 years   Other Topics Concern  . Not on file   Social History Narrative  . No narrative on file   Health Maintenance  Topic Date Due  . Tetanus/tdap  10/14/2004  . Influenza Vaccine  10/02/2013  . Pap Smear  06/10/2014   Past Medical History  Diagnosis Date  . No pertinent past medical history   . Strep throat   . BV (bacterial vaginosis)   . Trichomonas   . Obesity   . Ectopic pregnancy    Past Surgical History  Procedure Laterality Date  . Salpingectomy     Family History  Problem Relation Age of Onset  . Heart disease Mother   . Diabetes Mother   . Diabetes Father   . Heart disease Father   . Anesthesia problems Neg Hx   . Hypotension Neg Hx   . Malignant hyperthermia Neg Hx   . Pseudochol deficiency Neg Hx        Review of Systems A comprehensive review of systems was negative.   Objective:      GENERAL: Well-developed, well-nourished female in no acute distress. Obese HEENT: Normocephalic, atraumatic. Sclerae anicteric.  NECK: Supple. Normal thyroid.  LUNGS: Clear to auscultation bilaterally.  HEART: Regular rate and rhythm. BREASTS: Symmetric in size. No palpable  masses or lymphadenopathy, skin changes, or nipple drainage. ABDOMEN: Soft, nontender, nondistended. No organomegaly. PELVIC: Normal external female genitalia. Vagina is pink and rugated.  Normal discharge. Normal appearing cervix. Uterus is normal in size. No adnexal mass or tenderness. EXTREMITIES: No cyanosis, clubbing, or edema, 2+ distal pulses.    Assessment:    Healthy female exam.      Plan:    pap smear with cultures collected Advised to use over the counter ovulation predictor kits Patient will consider hysterosalpingogram pending investigating out patient cost Patient will be contacted with any abnormal results See After Visit Summary for Counseling Recommendations

## 2013-10-21 NOTE — Patient Instructions (Signed)
Preventive Care for Adults A healthy lifestyle and preventive care can promote health and wellness. Preventive health guidelines for women include the following key practices.  A routine yearly physical is a good way to check with your health care provider about your health and preventive screening. It is a chance to share any concerns and updates on your health and to receive a thorough exam.  Visit your dentist for a routine exam and preventive care every 6 months. Brush your teeth twice a day and floss once a day. Good oral hygiene prevents tooth decay and gum disease.  The frequency of eye exams is based on your age, health, family medical history, use of contact lenses, and other factors. Follow your health care provider's recommendations for frequency of eye exams.  Eat a healthy diet. Foods like vegetables, fruits, whole grains, low-fat dairy products, and lean protein foods contain the nutrients you need without too many calories. Decrease your intake of foods high in solid fats, added sugars, and salt. Eat the right amount of calories for you.Get information about a proper diet from your health care provider, if necessary.  Regular physical exercise is one of the most important things you can do for your health. Most adults should get at least 150 minutes of moderate-intensity exercise (any activity that increases your heart rate and causes you to sweat) each week. In addition, most adults need muscle-strengthening exercises on 2 or more days a week.  Maintain a healthy weight. The body mass index (BMI) is a screening tool to identify possible weight problems. It provides an estimate of body fat based on height and weight. Your health care provider can find your BMI and can help you achieve or maintain a healthy weight.For adults 20 years and older:  A BMI below 18.5 is considered underweight.  A BMI of 18.5 to 24.9 is normal.  A BMI of 25 to 29.9 is considered overweight.  A BMI of  30 and above is considered obese.  Maintain normal blood lipids and cholesterol levels by exercising and minimizing your intake of saturated fat. Eat a balanced diet with plenty of fruit and vegetables. Blood tests for lipids and cholesterol should begin at age 20 and be repeated every 5 years. If your lipid or cholesterol levels are high, you are over 50, or you are at high risk for heart disease, you may need your cholesterol levels checked more frequently.Ongoing high lipid and cholesterol levels should be treated with medicines if diet and exercise are not working.  If you smoke, find out from your health care provider how to quit. If you do not use tobacco, do not start.  Lung cancer screening is recommended for adults aged 55-80 years who are at high risk for developing lung cancer because of a history of smoking. A yearly low-dose CT scan of the lungs is recommended for people who have at least a 30-pack-year history of smoking and are a current smoker or have quit within the past 15 years. A pack year of smoking is smoking an average of 1 pack of cigarettes a day for 1 year (for example: 1 pack a day for 30 years or 2 packs a day for 15 years). Yearly screening should continue until the smoker has stopped smoking for at least 15 years. Yearly screening should be stopped for people who develop a health problem that would prevent them from having lung cancer treatment.  If you are pregnant, do not drink alcohol. If you are breastfeeding,   be very cautious about drinking alcohol. If you are not pregnant and choose to drink alcohol, do not have more than 1 drink per day. One drink is considered to be 12 ounces (355 mL) of beer, 5 ounces (148 mL) of wine, or 1.5 ounces (44 mL) of liquor.  Avoid use of street drugs. Do not share needles with anyone. Ask for help if you need support or instructions about stopping the use of drugs.  High blood pressure causes heart disease and increases the risk of  stroke. Your blood pressure should be checked at least every 1 to 2 years. Ongoing high blood pressure should be treated with medicines if weight loss and exercise do not work.  If you are 75-52 years old, ask your health care provider if you should take aspirin to prevent strokes.  Diabetes screening involves taking a blood sample to check your fasting blood sugar level. This should be done once every 3 years, after age 15, if you are within normal weight and without risk factors for diabetes. Testing should be considered at a younger age or be carried out more frequently if you are overweight and have at least 1 risk factor for diabetes.  Breast cancer screening is essential preventive care for women. You should practice "breast self-awareness." This means understanding the normal appearance and feel of your breasts and may include breast self-examination. Any changes detected, no matter how small, should be reported to a health care provider. Women in their 58s and 30s should have a clinical breast exam (CBE) by a health care provider as part of a regular health exam every 1 to 3 years. After age 16, women should have a CBE every year. Starting at age 53, women should consider having a mammogram (breast X-ray test) every year. Women who have a family history of breast cancer should talk to their health care provider about genetic screening. Women at a high risk of breast cancer should talk to their health care providers about having an MRI and a mammogram every year.  Breast cancer gene (BRCA)-related cancer risk assessment is recommended for women who have family members with BRCA-related cancers. BRCA-related cancers include breast, ovarian, tubal, and peritoneal cancers. Having family members with these cancers may be associated with an increased risk for harmful changes (mutations) in the breast cancer genes BRCA1 and BRCA2. Results of the assessment will determine the need for genetic counseling and  BRCA1 and BRCA2 testing.  Routine pelvic exams to screen for cancer are no longer recommended for nonpregnant women who are considered low risk for cancer of the pelvic organs (ovaries, uterus, and vagina) and who do not have symptoms. Ask your health care provider if a screening pelvic exam is right for you.  If you have had past treatment for cervical cancer or a condition that could lead to cancer, you need Pap tests and screening for cancer for at least 20 years after your treatment. If Pap tests have been discontinued, your risk factors (such as having a new sexual partner) need to be reassessed to determine if screening should be resumed. Some women have medical problems that increase the chance of getting cervical cancer. In these cases, your health care provider may recommend more frequent screening and Pap tests.  The HPV test is an additional test that may be used for cervical cancer screening. The HPV test looks for the virus that can cause the cell changes on the cervix. The cells collected during the Pap test can be  tested for HPV. The HPV test could be used to screen women aged 30 years and older, and should be used in women of any age who have unclear Pap test results. After the age of 30, women should have HPV testing at the same frequency as a Pap test.  Colorectal cancer can be detected and often prevented. Most routine colorectal cancer screening begins at the age of 50 years and continues through age 75 years. However, your health care provider may recommend screening at an earlier age if you have risk factors for colon cancer. On a yearly basis, your health care provider may provide home test kits to check for hidden blood in the stool. Use of a small camera at the end of a tube, to directly examine the colon (sigmoidoscopy or colonoscopy), can detect the earliest forms of colorectal cancer. Talk to your health care provider about this at age 50, when routine screening begins. Direct  exam of the colon should be repeated every 5-10 years through age 75 years, unless early forms of pre-cancerous polyps or small growths are found.  People who are at an increased risk for hepatitis B should be screened for this virus. You are considered at high risk for hepatitis B if:  You were born in a country where hepatitis B occurs often. Talk with your health care provider about which countries are considered high risk.  Your parents were born in a high-risk country and you have not received a shot to protect against hepatitis B (hepatitis B vaccine).  You have HIV or AIDS.  You use needles to inject street drugs.  You live with, or have sex with, someone who has hepatitis B.  You get hemodialysis treatment.  You take certain medicines for conditions like cancer, organ transplantation, and autoimmune conditions.  Hepatitis C blood testing is recommended for all people born from 1945 through 1965 and any individual with known risks for hepatitis C.  Practice safe sex. Use condoms and avoid high-risk sexual practices to reduce the spread of sexually transmitted infections (STIs). STIs include gonorrhea, chlamydia, syphilis, trichomonas, herpes, HPV, and human immunodeficiency virus (HIV). Herpes, HIV, and HPV are viral illnesses that have no cure. They can result in disability, cancer, and death.  You should be screened for sexually transmitted illnesses (STIs) including gonorrhea and chlamydia if:  You are sexually active and are younger than 24 years.  You are older than 24 years and your health care provider tells you that you are at risk for this type of infection.  Your sexual activity has changed since you were last screened and you are at an increased risk for chlamydia or gonorrhea. Ask your health care provider if you are at risk.  If you are at risk of being infected with HIV, it is recommended that you take a prescription medicine daily to prevent HIV infection. This is  called preexposure prophylaxis (PrEP). You are considered at risk if:  You are a heterosexual woman, are sexually active, and are at increased risk for HIV infection.  You take drugs by injection.  You are sexually active with a partner who has HIV.  Talk with your health care provider about whether you are at high risk of being infected with HIV. If you choose to begin PrEP, you should first be tested for HIV. You should then be tested every 3 months for as long as you are taking PrEP.  Osteoporosis is a disease in which the bones lose minerals and strength   with aging. This can result in serious bone fractures or breaks. The risk of osteoporosis can be identified using a bone density scan. Women ages 65 years and over and women at risk for fractures or osteoporosis should discuss screening with their health care providers. Ask your health care provider whether you should take a calcium supplement or vitamin D to reduce the rate of osteoporosis.  Menopause can be associated with physical symptoms and risks. Hormone replacement therapy is available to decrease symptoms and risks. You should talk to your health care provider about whether hormone replacement therapy is right for you.  Use sunscreen. Apply sunscreen liberally and repeatedly throughout the day. You should seek shade when your shadow is shorter than you. Protect yourself by wearing long sleeves, pants, a wide-brimmed hat, and sunglasses year round, whenever you are outdoors.  Once a month, do a whole body skin exam, using a mirror to look at the skin on your back. Tell your health care provider of new moles, moles that have irregular borders, moles that are larger than a pencil eraser, or moles that have changed in shape or color.  Stay current with required vaccines (immunizations).  Influenza vaccine. All adults should be immunized every year.  Tetanus, diphtheria, and acellular pertussis (Td, Tdap) vaccine. Pregnant women should  receive 1 dose of Tdap vaccine during each pregnancy. The dose should be obtained regardless of the length of time since the last dose. Immunization is preferred during the 27th-36th week of gestation. An adult who has not previously received Tdap or who does not know her vaccine status should receive 1 dose of Tdap. This initial dose should be followed by tetanus and diphtheria toxoids (Td) booster doses every 10 years. Adults with an unknown or incomplete history of completing a 3-dose immunization series with Td-containing vaccines should begin or complete a primary immunization series including a Tdap dose. Adults should receive a Td booster every 10 years.  Varicella vaccine. An adult without evidence of immunity to varicella should receive 2 doses or a second dose if she has previously received 1 dose. Pregnant females who do not have evidence of immunity should receive the first dose after pregnancy. This first dose should be obtained before leaving the health care facility. The second dose should be obtained 4-8 weeks after the first dose.  Human papillomavirus (HPV) vaccine. Females aged 13-26 years who have not received the vaccine previously should obtain the 3-dose series. The vaccine is not recommended for use in pregnant females. However, pregnancy testing is not needed before receiving a dose. If a female is found to be pregnant after receiving a dose, no treatment is needed. In that case, the remaining doses should be delayed until after the pregnancy. Immunization is recommended for any person with an immunocompromised condition through the age of 26 years if she did not get any or all doses earlier. During the 3-dose series, the second dose should be obtained 4-8 weeks after the first dose. The third dose should be obtained 24 weeks after the first dose and 16 weeks after the second dose.  Zoster vaccine. One dose is recommended for adults aged 60 years or older unless certain conditions are  present.  Measles, mumps, and rubella (MMR) vaccine. Adults born before 1957 generally are considered immune to measles and mumps. Adults born in 1957 or later should have 1 or more doses of MMR vaccine unless there is a contraindication to the vaccine or there is laboratory evidence of immunity to   each of the three diseases. A routine second dose of MMR vaccine should be obtained at least 28 days after the first dose for students attending postsecondary schools, health care workers, or international travelers. People who received inactivated measles vaccine or an unknown type of measles vaccine during 1963-1967 should receive 2 doses of MMR vaccine. People who received inactivated mumps vaccine or an unknown type of mumps vaccine before 1979 and are at high risk for mumps infection should consider immunization with 2 doses of MMR vaccine. For females of childbearing age, rubella immunity should be determined. If there is no evidence of immunity, females who are not pregnant should be vaccinated. If there is no evidence of immunity, females who are pregnant should delay immunization until after pregnancy. Unvaccinated health care workers born before 1957 who lack laboratory evidence of measles, mumps, or rubella immunity or laboratory confirmation of disease should consider measles and mumps immunization with 2 doses of MMR vaccine or rubella immunization with 1 dose of MMR vaccine.  Pneumococcal 13-valent conjugate (PCV13) vaccine. When indicated, a person who is uncertain of her immunization history and has no record of immunization should receive the PCV13 vaccine. An adult aged 19 years or older who has certain medical conditions and has not been previously immunized should receive 1 dose of PCV13 vaccine. This PCV13 should be followed with a dose of pneumococcal polysaccharide (PPSV23) vaccine. The PPSV23 vaccine dose should be obtained at least 8 weeks after the dose of PCV13 vaccine. An adult aged 19  years or older who has certain medical conditions and previously received 1 or more doses of PPSV23 vaccine should receive 1 dose of PCV13. The PCV13 vaccine dose should be obtained 1 or more years after the last PPSV23 vaccine dose.  Pneumococcal polysaccharide (PPSV23) vaccine. When PCV13 is also indicated, PCV13 should be obtained first. All adults aged 65 years and older should be immunized. An adult younger than age 65 years who has certain medical conditions should be immunized. Any person who resides in a nursing home or long-term care facility should be immunized. An adult smoker should be immunized. People with an immunocompromised condition and certain other conditions should receive both PCV13 and PPSV23 vaccines. People with human immunodeficiency virus (HIV) infection should be immunized as soon as possible after diagnosis. Immunization during chemotherapy or radiation therapy should be avoided. Routine use of PPSV23 vaccine is not recommended for American Indians, [place] Natives, or people younger than 65 years unless there are medical conditions that require PPSV23 vaccine. When indicated, people who have unknown immunization and have no record of immunization should receive PPSV23 vaccine. One-time revaccination 5 years after the first dose of PPSV23 is recommended for people aged 19-64 years who have chronic kidney failure, nephrotic syndrome, asplenia, or immunocompromised conditions. People who received 1-2 doses of PPSV23 before age 65 years should receive another dose of PPSV23 vaccine at age 65 years or later if at least 5 years have passed since the previous dose. Doses of PPSV23 are not needed for people immunized with PPSV23 at or after age 65 years.  Meningococcal vaccine. Adults with asplenia or persistent complement component deficiencies should receive 2 doses of quadrivalent meningococcal conjugate (MenACWY-D) vaccine. The doses should be obtained at least 2 months apart.  Microbiologists working with certain meningococcal bacteria, military recruits, people at risk during an outbreak, and people who travel to or live in countries with a high rate of meningitis should be immunized. A first-year college student up through age   21 years who is living in a residence hall should receive a dose if she did not receive a dose on or after her 16th birthday. Adults who have certain high-risk conditions should receive one or more doses of vaccine.  Hepatitis A vaccine. Adults who wish to be protected from this disease, have certain high-risk conditions, work with hepatitis A-infected animals, work in hepatitis A research labs, or travel to or work in countries with a high rate of hepatitis A should be immunized. Adults who were previously unvaccinated and who anticipate close contact with an international adoptee during the first 60 days after arrival in the [place] from a country with a high rate of hepatitis A should be immunized.  Hepatitis B vaccine. Adults who wish to be protected from this disease, have certain high-risk conditions, may be exposed to blood or other infectious body fluids, are household contacts or sex partners of hepatitis B positive people, are clients or workers in certain care facilities, or travel to or work in countries with a high rate of hepatitis B should be immunized.  Haemophilus influenzae type b (Hib) vaccine. A previously unvaccinated person with asplenia or sickle cell disease or having a scheduled splenectomy should receive 1 dose of Hib vaccine. Regardless of previous immunization, a recipient of a hematopoietic stem cell transplant should receive a 3-dose series 6-12 months after her successful transplant. Hib vaccine is not recommended for adults with HIV infection. Preventive Services / Frequency Ages 64 to 68 years  Blood pressure check.** / Every 1 to 2 years.  Lipid and cholesterol check.** / Every 5 years beginning at age  22.  Clinical breast exam.** / Every 3 years for women in their 88s and 53s.  BRCA-related cancer risk assessment.** / For women who have family members with a BRCA-related cancer (breast, ovarian, tubal, or peritoneal cancers).  Pap test.** / Every 2 years from ages 90 through 51. Every 3 years starting at age 21 through age 56 or 3 with a history of 3 consecutive normal Pap tests.  HPV screening.** / Every 3 years from ages 24 through ages 1 to 46 with a history of 3 consecutive normal Pap tests.  Hepatitis C blood test.** / For any individual with known risks for hepatitis C.  Skin self-exam. / Monthly.  Influenza vaccine. / Every year.  Tetanus, diphtheria, and acellular pertussis (Tdap, Td) vaccine.** / Consult your health care provider. Pregnant women should receive 1 dose of Tdap vaccine during each pregnancy. 1 dose of Td every 10 years.  Varicella vaccine.** / Consult your health care provider. Pregnant females who do not have evidence of immunity should receive the first dose after pregnancy.  HPV vaccine. / 3 doses over 6 months, if 72 and younger. The vaccine is not recommended for use in pregnant females. However, pregnancy testing is not needed before receiving a dose.  Measles, mumps, rubella (MMR) vaccine.** / You need at least 1 dose of MMR if you were born in 1957 or later. You may also need a 2nd dose. For females of childbearing age, rubella immunity should be determined. If there is no evidence of immunity, females who are not pregnant should be vaccinated. If there is no evidence of immunity, females who are pregnant should delay immunization until after pregnancy.  Pneumococcal 13-valent conjugate (PCV13) vaccine.** / Consult your health care provider.  Pneumococcal polysaccharide (PPSV23) vaccine.** / 1 to 2 doses if you smoke cigarettes or if you have certain conditions.  Meningococcal vaccine.** /  1 dose if you are age 19 to 21 years and a first-year college  student living in a residence hall, or have one of several medical conditions, you need to get vaccinated against meningococcal disease. You may also need additional booster doses.  Hepatitis A vaccine.** / Consult your health care provider.  Hepatitis B vaccine.** / Consult your health care provider.  Haemophilus influenzae type b (Hib) vaccine.** / Consult your health care provider. Ages 40 to 64 years  Blood pressure check.** / Every 1 to 2 years.  Lipid and cholesterol check.** / Every 5 years beginning at age 20 years.  Lung cancer screening. / Every year if you are aged 55-80 years and have a 30-pack-year history of smoking and currently smoke or have quit within the past 15 years. Yearly screening is stopped once you have quit smoking for at least 15 years or develop a health problem that would prevent you from having lung cancer treatment.  Clinical breast exam.** / Every year after age 40 years.  BRCA-related cancer risk assessment.** / For women who have family members with a BRCA-related cancer (breast, ovarian, tubal, or peritoneal cancers).  Mammogram.** / Every year beginning at age 40 years and continuing for as long as you are in good health. Consult with your health care provider.  Pap test.** / Every 3 years starting at age 30 years through age 65 or 70 years with a history of 3 consecutive normal Pap tests.  HPV screening.** / Every 3 years from ages 30 years through ages 65 to 70 years with a history of 3 consecutive normal Pap tests.  Fecal occult blood test (FOBT) of stool. / Every year beginning at age 50 years and continuing until age 75 years. You may not need to do this test if you get a colonoscopy every 10 years.  Flexible sigmoidoscopy or colonoscopy.** / Every 5 years for a flexible sigmoidoscopy or every 10 years for a colonoscopy beginning at age 50 years and continuing until age 75 years.  Hepatitis C blood test.** / For all people born from 1945 through  1965 and any individual with known risks for hepatitis C.  Skin self-exam. / Monthly.  Influenza vaccine. / Every year.  Tetanus, diphtheria, and acellular pertussis (Tdap/Td) vaccine.** / Consult your health care provider. Pregnant women should receive 1 dose of Tdap vaccine during each pregnancy. 1 dose of Td every 10 years.  Varicella vaccine.** / Consult your health care provider. Pregnant females who do not have evidence of immunity should receive the first dose after pregnancy.  Zoster vaccine.** / 1 dose for adults aged 60 years or older.  Measles, mumps, rubella (MMR) vaccine.** / You need at least 1 dose of MMR if you were born in 1957 or later. You may also need a 2nd dose. For females of childbearing age, rubella immunity should be determined. If there is no evidence of immunity, females who are not pregnant should be vaccinated. If there is no evidence of immunity, females who are pregnant should delay immunization until after pregnancy.  Pneumococcal 13-valent conjugate (PCV13) vaccine.** / Consult your health care provider.  Pneumococcal polysaccharide (PPSV23) vaccine.** / 1 to 2 doses if you smoke cigarettes or if you have certain conditions.  Meningococcal vaccine.** / Consult your health care provider.  Hepatitis A vaccine.** / Consult your health care provider.  Hepatitis B vaccine.** / Consult your health care provider.  Haemophilus influenzae type b (Hib) vaccine.** / Consult your health care provider. Ages 65   years and over  Blood pressure check.** / Every 1 to 2 years.  Lipid and cholesterol check.** / Every 5 years beginning at age 22 years.  Lung cancer screening. / Every year if you are aged 73-80 years and have a 30-pack-year history of smoking and currently smoke or have quit within the past 15 years. Yearly screening is stopped once you have quit smoking for at least 15 years or develop a health problem that would prevent you from having lung cancer  treatment.  Clinical breast exam.** / Every year after age 4 years.  BRCA-related cancer risk assessment.** / For women who have family members with a BRCA-related cancer (breast, ovarian, tubal, or peritoneal cancers).  Mammogram.** / Every year beginning at age 40 years and continuing for as long as you are in good health. Consult with your health care provider.  Pap test.** / Every 3 years starting at age 9 years through age 34 or 91 years with 3 consecutive normal Pap tests. Testing can be stopped between 65 and 70 years with 3 consecutive normal Pap tests and no abnormal Pap or HPV tests in the past 10 years.  HPV screening.** / Every 3 years from ages 57 years through ages 64 or 45 years with a history of 3 consecutive normal Pap tests. Testing can be stopped between 65 and 70 years with 3 consecutive normal Pap tests and no abnormal Pap or HPV tests in the past 10 years.  Fecal occult blood test (FOBT) of stool. / Every year beginning at age 15 years and continuing until age 17 years. You may not need to do this test if you get a colonoscopy every 10 years.  Flexible sigmoidoscopy or colonoscopy.** / Every 5 years for a flexible sigmoidoscopy or every 10 years for a colonoscopy beginning at age 86 years and continuing until age 71 years.  Hepatitis C blood test.** / For all people born from [birth year] through 1965 and any individual with known risks for hepatitis C.  Osteoporosis screening.** / A one-time screening for women ages 83 years and over and women at risk for fractures or osteoporosis.  Skin self-exam. / Monthly.  Influenza vaccine. / Every year.  Tetanus, diphtheria, and acellular pertussis (Tdap/Td) vaccine.** / 1 dose of Td every 10 years.  Varicella vaccine.** / Consult your health care provider.  Zoster vaccine.** / 1 dose for adults aged 61 years or older.  Pneumococcal 13-valent conjugate (PCV13) vaccine.** / Consult your health care provider.  Pneumococcal  polysaccharide (PPSV23) vaccine.** / 1 dose for all adults aged 28 years and older.  Meningococcal vaccine.** / Consult your health care provider.  Hepatitis A vaccine.** / Consult your health care provider.  Hepatitis B vaccine.** / Consult your health care provider.  Haemophilus influenzae type b (Hib) vaccine.** / Consult your health care provider. ** Family history and personal history of risk and conditions may change your health care provider's recommendations. Document Released: 04/16/2001 Document Revised: 07/05/2013 Document Reviewed: 07/16/2010 [facility] Patient Information 2015 [place], [place]. This information is not intended to replace advice given to you by your health care provider. Make sure you discuss any questions you have with your health care provider.

## 2013-10-22 LAB — CYTOLOGY - PAP

## 2013-11-02 ENCOUNTER — Telehealth: Payer: Self-pay | Admitting: *Deleted

## 2013-11-02 NOTE — Telephone Encounter (Signed)
Pt left message requesting Pap result. I called pt and informed her that she had a normal Pap.  She was also tested for Gonorrhea and Chlamydia which were negative.  Pt voiced understanding and had no additional questions.

## 2013-11-19 ENCOUNTER — Telehealth: Payer: Self-pay | Admitting: *Deleted

## 2013-11-19 NOTE — Telephone Encounter (Signed)
Pt called nurse line requesting to speak with a nurse concerns about heavy bleeding.  Attempted to contact patient, no answer, left message that we were returning her call.  Gave information that clinic closes at noon on Friday and we would try again on Monday.

## 2013-11-19 NOTE — Telephone Encounter (Signed)
Contacted patient to discuss heavy bleeding.  Pt states she is having bright red bleeding and going through pads in less than an hour. Pt states she is feeling dizzy and lightheaded.  Encouraged patient to go to MAU immediately for evaluation.  Pt verbalizes understanding.

## 2013-11-29 ENCOUNTER — Inpatient Hospital Stay (HOSPITAL_COMMUNITY)
Admission: AD | Admit: 2013-11-29 | Discharge: 2013-11-29 | Disposition: A | Discharge status: Home / Self Care | Payer: BC Managed Care – PPO | Source: Ambulatory Visit | Attending: Family Medicine | Admitting: Family Medicine

## 2013-11-29 ENCOUNTER — Encounter (HOSPITAL_COMMUNITY): Payer: Self-pay | Admitting: *Deleted

## 2013-11-29 ENCOUNTER — Inpatient Hospital Stay (HOSPITAL_COMMUNITY): Payer: BC Managed Care – PPO

## 2013-11-29 DIAGNOSIS — N949 Unspecified condition associated with female genital organs and menstrual cycle: Secondary | ICD-10-CM | POA: Diagnosis present

## 2013-11-29 DIAGNOSIS — N938 Other specified abnormal uterine and vaginal bleeding: Secondary | ICD-10-CM | POA: Insufficient documentation

## 2013-11-29 DIAGNOSIS — N925 Other specified irregular menstruation: Secondary | ICD-10-CM | POA: Diagnosis present

## 2013-11-29 DIAGNOSIS — N979 Female infertility, unspecified: Secondary | ICD-10-CM | POA: Insufficient documentation

## 2013-11-29 DIAGNOSIS — N970 Female infertility associated with anovulation: Secondary | ICD-10-CM

## 2013-11-29 DIAGNOSIS — N644 Mastodynia: Secondary | ICD-10-CM | POA: Insufficient documentation

## 2013-11-29 HISTORY — DX: Anemia, unspecified: D64.9

## 2013-11-29 LAB — CBC
HEMATOCRIT: 31.9 % — AB (ref 36.0–46.0)
HEMOGLOBIN: 10.5 g/dL — AB (ref 12.0–15.0)
MCH: 25.2 pg — ABNORMAL LOW (ref 26.0–34.0)
MCHC: 32.9 g/dL (ref 30.0–36.0)
MCV: 76.7 fL — AB (ref 78.0–100.0)
Platelets: 359 10*3/uL (ref 150–400)
RBC: 4.16 MIL/uL (ref 3.87–5.11)
RDW: 15.7 % — ABNORMAL HIGH (ref 11.5–15.5)
WBC: 9.8 10*3/uL (ref 4.0–10.5)

## 2013-11-29 LAB — WET PREP, GENITAL
CLUE CELLS WET PREP: NONE SEEN
TRICH WET PREP: NONE SEEN
WBC WET PREP: NONE SEEN
Yeast Wet Prep HPF POC: NONE SEEN

## 2013-11-29 LAB — HIV ANTIBODY (ROUTINE TESTING W REFLEX): HIV 1&2 Ab, 4th Generation: NONREACTIVE

## 2013-11-29 LAB — RPR

## 2013-11-29 LAB — POCT PREGNANCY, URINE: Preg Test, Ur: NEGATIVE

## 2013-11-29 MED ORDER — MEGESTROL ACETATE 40 MG PO TABS: ORAL_TABLET | ORAL | Status: DC

## 2013-11-29 NOTE — MAU Note (Signed)
Urine in lab 

## 2013-11-29 NOTE — MAU Provider Note (Signed)
History     CSN: [id number]  Arrival date and time: 11/29/13 1424   None     Chief Complaint  Patient presents with  . Vaginal Bleeding   HPI  Regina Watkins is a 28 y.o. G1P0010 complaining of heavy vaginal bleeding. Pt started bleeding about 2 weeks ago, 2 weeks after her normal cycle. She says that the amount of blood is a lot but she is un able to quantify it. The blood has been increasing and is dark black and has clots in it. The bleeding is associated with abdominal pain and back pain. She also admits to having light headedness, headaches, and dizziness. She denies fever, chills, cough, nausea, diarrhea. More ROS below .  She wants to get pregnant, but wants to stop this bleeding and will try later. OB History   Grav Para Term Preterm Abortions TAB SAB Ect Mult Living        Past Medical History  Diagnosis Date  . No pertinent past medical history   . Strep throat   . BV (bacterial vaginosis)   . Trichomonas   . Obesity   . Ectopic pregnancy   . Anemia     Past Surgical History  Procedure Laterality Date  . Salpingectomy      Family History  Problem Relation Age of Onset  . Heart disease Mother   . Diabetes Mother   . Diabetes Father   . Heart disease Father   . Anesthesia problems Neg Hx   . Hypotension Neg Hx   . Malignant hyperthermia Neg Hx   . Pseudochol deficiency Neg Hx     History  Substance Use Topics  . Smoking status: Never Smoker   . Smokeless tobacco: Never Used  . Alcohol Use: No    Allergies: No Known Allergies  Prescriptions prior to admission  Medication Sig Dispense Refill  . Aspirin-Salicylamide-Caffeine (BC HEADACHE PO) Take 1 packet by mouth as needed (headache).       Results for orders placed during the hospital encounter of 11/29/13 (from the past 24 hour(s))  POCT PREGNANCY, URINE     Status: None   Collection Time    11/29/13  3:21 PM      Result Value Ref Range   Preg Test, Ur NEGATIVE   NEGATIVE  CBC     Status: Abnormal   Collection Time    11/29/13  3:34 PM      Result Value Ref Range   WBC 9.8  4.0 - 10.5 K/uL   RBC 4.16  3.87 - 5.11 MIL/uL   Hemoglobin 10.5 (*) 12.0 - 15.0 g/dL   HCT 29.5 (*) 62.1 - 30.8 %   MCV 76.7 (*) 78.0 - 100.0 fL   MCH 25.2 (*) 26.0 - 34.0 pg   MCHC 32.9  30.0 - 36.0 g/dL   RDW 65.7 (*) 84.6 - 96.2 %   Platelets 359  150 - 400 K/uL     Review of Systems  Constitutional: Negative for fever and chills.  HENT: Negative for congestion.   Eyes: Negative for blurred vision.  Respiratory: Negative for cough.   Cardiovascular: Negative for chest pain.  Gastrointestinal: Positive for abdominal pain. Negative for nausea, vomiting, diarrhea and constipation.  Neurological: Positive for dizziness and headaches.  Psychiatric/Behavioral: Negative for depression.   Physical Exam   Blood pressure 128/69, pulse 80, temperature 98.4 F (36.9 C), temperature source Oral, resp. rate 18, height  5' 4.5" (1.638 m), weight 137.893 kg (304 lb), last menstrual period 11/15/2013.  Physical Exam  Constitutional: She is oriented to person, place, and time. She appears well-developed and well-nourished. No distress.  HENT:  Head: Normocephalic.  Neck: Normal range of motion.  Respiratory: Effort normal.  Musculoskeletal: Normal range of motion.  Neurological: She is alert and oriented to person, place, and time.  Pelvic:  SSE:  Small amount of dark red blood coming from cervical os.  Cx nonfriable  CLINICAL DATA:  Dysfunctional uterine bleeding, history of salpingectomy for ectopic pregnancy, anemia   EXAM: TRANSABDOMINAL AND TRANSVAGINAL ULTRASOUND OF PELVIS   TECHNIQUE: Both transabdominal and transvaginal ultrasound examinations of the pelvis were performed. Transabdominal technique was performed for global imaging of the pelvis including uterus, ovaries, adnexal regions, and pelvic cul-de-sac. It was necessary to proceed with endovaginal exam  following the transabdominal exam to visualize the endometrium and bilateral ovaries.   COMPARISON:  07/19/2010   FINDINGS: Uterus   Measurements: 7.5 x 3.7 x 5.3 cm. No fibroids or other mass visualized.   Endometrium   Thickness: 10 mm.  No focal abnormality visualized.   Right ovary   Measurements: 2.8 x 2.2 x 1.6 cm. Normal appearance/no adnexal mass.   Left ovary   Measurements: 3.5 x 3.2 x 3.5 cm. Poorly visualized. Normal appearance/no adnexal mass.   Other findings   No free fluid.   IMPRESSION: Negative pelvic ultrasound.     MAU Course  Procedures Pelvic US to r/o non hormonal bleeding source: normal MDM Stable during hospital stay  Assessment and Plan  A: Anemia DUB: likely anovulatory   P: Refill Iron Megace algorhythm PNV  Rosemary Holms 11/29/2013, 4:53 PM

## 2013-11-29 NOTE — MAU Note (Addendum)
2 wks ago, came on period, stayed on for 2 wks, heavy with clots, stopped on Friday- restarted yesterday. Passing big blood clots. Called dr and was told to come in, was not comfortable coming in when bleeding so heavy.  Now feeling light headed, hx of anemia.  Is supposed to be taking iron, ran out over a month ago.

## 2013-11-29 NOTE — MAU Provider Note (Signed)
First Provider Initiated Contact with Patient 11/29/13 1657      Chief Complaint:  Vaginal Bleeding   Regina Watkins is  28 y.o. G1P0010.  Patient's last menstrual period was 11/15/2013.Marland Kitchen  Her pregnancy status is negative.  She presents complaining of Vaginal Bleeding She had a normal period at the beginning of the month, and began bleeding agoin 2 weeks ago, light to heavy.  She wants to get pregnant, but jsut wants the bleeding to stop for now.   Past Medical History  Diagnosis Date  . No pertinent past medical history   . Strep throat   . BV (bacterial vaginosis)   . Trichomonas   . Obesity   . Ectopic pregnancy   . Anemia     Past Surgical History  Procedure Laterality Date  . Salpingectomy      Family History  Problem Relation Age of Onset  . Heart disease Mother   . Diabetes Mother   . Diabetes Father   . Heart disease Father   . Anesthesia problems Neg Hx   . Hypotension Neg Hx   . Malignant hyperthermia Neg Hx   . Pseudochol deficiency Neg Hx     History  Substance Use Topics  . Smoking status: Never Smoker   . Smokeless tobacco: Never Used  . Alcohol Use: No    Allergies: No Known Allergies  No prescriptions prior to admission     Review of Systems   Constitutional: Negative for fever and chills Eyes: Negative for visual disturbances Respiratory: Negative for shortness of breath, dyspnea Cardiovascular: Negative for chest pain or palpitations  Gastrointestinal: Negative for vomiting, diarrhea and constipation Genitourinary: Negative for dysuria and urgency Musculoskeletal: Negative for back pain, joint pain, myalgias  Neurological: Negative for dizziness and headaches     Physical Exam   Blood pressure 97/47, pulse 57, temperature 98.2 F (36.8 C), temperature source Oral, resp. rate 18, height 5' 4.5" (1.638 m), weight 137.893 kg (304 lb), last menstrual period 11/15/2013.  General: General appearance - alert, well appearing, and in  no distress Chest - clear to auscultation, no wheezes, rales or rhonchi, symmetric air entry Heart - normal rate and regular rhythm Abdomen - soft, nontender, nondistended, no masses or organomegaly Pelvic - SSE: small amount of dark red bleeding coming from cervical os.  No odor.  Bimanual limited by habitus, but no tenderness Extremities - no pedal edema noted   Labs: Results for orders placed during the hospital encounter of 11/29/13 (from the past 24 hour(s))  POCT PREGNANCY, URINE   Collection Time    11/29/13  3:21 PM      Result Value Ref Range   Preg Test, Ur NEGATIVE  NEGATIVE  CBC   Collection Time    11/29/13  3:34 PM      Result Value Ref Range   WBC 9.8  4.0 - 10.5 K/uL   RBC 4.16  3.87 - 5.11 MIL/uL   Hemoglobin 10.5 (*) 12.0 - 15.0 g/dL   HCT 47.8 (*) 29.5 - 62.1 %   MCV 76.7 (*) 78.0 - 100.0 fL   MCH 25.2 (*) 26.0 - 34.0 pg   MCHC 32.9  30.0 - 36.0 g/dL   RDW 30.8 (*) 65.7 - 84.6 %   Platelets 359  150 - 400 K/uL  RPR   Collection Time    11/29/13  3:34 PM      Result Value Ref Range   RPR NON REAC  NON REAC  WET  PREP, GENITAL   Collection Time    11/29/13  5:10 PM      Result Value Ref Range   Yeast Wet Prep HPF POC NONE SEEN  NONE SEEN   Trich, Wet Prep NONE SEEN  NONE SEEN   Clue Cells Wet Prep HPF POC NONE SEEN  NONE SEEN   WBC, Wet Prep HPF POC NONE SEEN  NONE SEEN   Imaging Studies:  US Transvaginal Non-ob  11/29/2013   CLINICAL DATA:  Dysfunctional uterine bleeding, history of salpingectomy for ectopic pregnancy, anemia  EXAM: TRANSABDOMINAL AND TRANSVAGINAL ULTRASOUND OF PELVIS  TECHNIQUE: Both transabdominal and transvaginal ultrasound examinations of the pelvis were performed. Transabdominal technique was performed for global imaging of the pelvis including uterus, ovaries, adnexal regions, and pelvic cul-de-sac. It was necessary to proceed with endovaginal exam following the transabdominal exam to visualize the endometrium and bilateral ovaries.   COMPARISON:  07/19/2010  FINDINGS: Uterus  Measurements: 7.5 x 3.7 x 5.3 cm. No fibroids or other mass visualized.  Endometrium  Thickness: 10 mm.  No focal abnormality visualized.  Right ovary  Measurements: 2.8 x 2.2 x 1.6 cm. Normal appearance/no adnexal mass.  Left ovary  Measurements: 3.5 x 3.2 x 3.5 cm. Poorly visualized. Normal appearance/no adnexal mass.  Other findings  No free fluid.  IMPRESSION: Negative pelvic ultrasound.   Electronically Signed   By: Charline Bills M.D.   On: 11/29/2013 18:10   US Pelvis Complete  11/29/2013   CLINICAL DATA:  Dysfunctional uterine bleeding, history of salpingectomy for ectopic pregnancy, anemia  EXAM: TRANSABDOMINAL AND TRANSVAGINAL ULTRASOUND OF PELVIS  TECHNIQUE: Both transabdominal and transvaginal ultrasound examinations of the pelvis were performed. Transabdominal technique was performed for global imaging of the pelvis including uterus, ovaries, adnexal regions, and pelvic cul-de-sac. It was necessary to proceed with endovaginal exam following the transabdominal exam to visualize the endometrium and bilateral ovaries.  COMPARISON:  07/19/2010  FINDINGS: Uterus  Measurements: 7.5 x 3.7 x 5.3 cm. No fibroids or other mass visualized.  Endometrium  Thickness: 10 mm.  No focal abnormality visualized.  Right ovary  Measurements: 2.8 x 2.2 x 1.6 cm. Normal appearance/no adnexal mass.  Left ovary  Measurements: 3.5 x 3.2 x 3.5 cm. Poorly visualized. Normal appearance/no adnexal mass.  Other findings  No free fluid.  IMPRESSION: Negative pelvic ultrasound.   Electronically Signed   By: Charline Bills M.D.   On: 11/29/2013 18:10     Assessment: Patient Active Problem List   Diagnosis Date Noted  . Mastalgia 06/11/2011  . 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 History of ectopic pregnancy 06/11/2011    Plan: DUB Megace algorhythm to stop bleeding F/U when able to assess fertility (has been trying to get pregnant for 4  years)  CRESENZO-DISHMAN,Caasi Giglia

## 2013-11-29 NOTE — Discharge Instructions (Signed)
Hysterosalpingography Hysterosalpingography is a procedure to look inside your uterus and fallopian tubes. During this procedure, contrast dye is injected into your uterus through your vagina and cervix to illuminate your uterus while X-ray pictures are taken. This procedure may help your health care provider determine whether you have uterine tumors, adhesions, or structural abnormalities. It is commonly used to help determine why a woman is unable to have children (infertility). The procedure usually lasts about 15-30 minutes. LET [facility] CARE PROVIDER KNOW ABOUT:  Any allergies you have.  All medicines you are taking, including vitamins, herbs, eye drops, creams, and over-the-counter medicines.  Previous problems you or members of your family have had with the use of anesthetics.  Any blood disorders you have.  Previous surgeries you have had.  Medical conditions you have. RISKS AND COMPLICATIONS  Generally, this is a safe procedure. However, as with any procedure, problems can occur. Possible problems include:  Infection in the lining of the uterus (endometritis) or fallopian tubes (salpingitis).  Damage or perforation of the uterus or fallopian tubes.  An allergic reaction to the contrast dye used to perform the X-ray. BEFORE THE PROCEDURE   Schedule the procedure after your period stops, but before your next ovulation. This is usually between day 5 and day 10 of your last period. Day 1 is the first day of your period.  Ask your health care provider about changing or stopping your regular medicines.  You may eat and drink as normal.  Empty your bladder before the procedure begins. PROCEDURE  You may be given a medicine to relax you (sedative) or an over-the-counter pain medicine to lessen any discomfort during the procedure.  You will lie down on an X-ray table with your feet in stirrups.  A device called a speculum will be placed into your vagina. This allows your  health care provider to see inside your vagina to the cervix.  The cervix will be washed with a special soap.  A thin, flexible tube will be passed through the cervix into the uterus.  Contrast dye will be put into this tube.  Several X-rays will be taken as the contrast dye spreads through the uterus and fallopian tubes.  The tube will be taken out after the procedure. AFTER THE PROCEDURE   Most of the contrast dye will flow out of your vagina naturally. You may want to wear a sanitary pad.  You may feel mild cramping and notice a little bleeding from your vagina. This should go away in 24 hours.  Ask when your test results will be ready. Make sure you get your test results. Document Released: 03/23/2004 Document Revised: 02/23/2013 Document Reviewed: 08/21/2012 [facility] Patient Information 2015 [place], [place]. This information is not intended to replace advice given to you by your health care provider. Make sure you discuss any questions you have with your health care provider. Dysfunctional Uterine Bleeding Normally, menstrual periods begin between ages 91 to 37 in young women. A normal menstrual cycle/period may begin every 23 days up to 35 days and lasts from 1 to 7 days. Around 12 to 14 days before your menstrual period starts, ovulation (ovary produces an egg) occurs. When counting the time between menstrual periods, count from the first day of bleeding of the previous period to the first day of bleeding of the next period. Dysfunctional (abnormal) uterine bleeding is bleeding that is different from a normal menstrual period. Your periods may come earlier or later than usual. They may be lighter,  have blood clots or be heavier. You may have bleeding between periods, or you may skip one period or more. You may have bleeding after sexual intercourse, bleeding after menopause, or no menstrual period. CAUSES   Pregnancy (normal, miscarriage, tubal).  IUDs (intrauterine device, birth  control).  Birth control pills.  Hormone treatment.  Menopause.  Infection of the cervix.  Blood clotting problems.  Infection of the inside lining of the uterus.  Endometriosis, inside lining of the uterus growing in the pelvis and other female organs.  Adhesions (scar tissue) inside the uterus.  Obesity or severe weight loss.  Uterine polyps inside the uterus.  Cancer of the vagina, cervix, or uterus.  Ovarian cysts or polycystic ovary syndrome.  Medical problems (diabetes, thyroid disease).  Uterine fibroids (noncancerous tumor).  Problems with your female hormones.  Endometrial hyperplasia, very thick lining and enlarged cells inside of the uterus.  Medicines that interfere with ovulation.  Radiation to the pelvis or abdomen.  Chemotherapy. DIAGNOSIS   Your doctor will discuss the history of your menstrual periods, medicines you are taking, changes in your weight, stress in your life, and any medical problems you may have.  Your doctor will do a physical and pelvic examination.  Your doctor may want to perform certain tests to make a diagnosis, such as:  Pap test.  Blood tests.  Cultures for infection.  CT scan.  Ultrasound.  Hysteroscopy.  Laparoscopy.  MRI.  Hysterosalpingography.  D and C.  Endometrial biopsy. TREATMENT  Treatment will depend on the cause of the dysfunctional uterine bleeding (DUB). Treatment may include:  Observing your menstrual periods for a couple of months.  Prescribing medicines for medical problems, including:  Antibiotics.  Hormones.  Birth control pills.  Removing an IUD (intrauterine device, birth control).  Surgery:  D and C (scrape and remove tissue from inside the uterus).  Laparoscopy (examine inside the abdomen with a lighted tube).  Uterine ablation (destroy lining of the uterus with electrical current, laser, heat, or freezing).  Hysteroscopy (examine cervix and uterus with a lighted  tube).  Hysterectomy (remove the uterus). HOME CARE INSTRUCTIONS   If medicines were prescribed, take exactly as directed. Do not change or switch medicines without consulting your caregiver.  Long term heavy bleeding may result in iron deficiency. Your caregiver may have prescribed iron pills. They help replace the iron that your body lost from heavy bleeding. Take exactly as directed.  Do not take aspirin or medicines that contain aspirin one week before or during your menstrual period. Aspirin may make the bleeding worse.  If you need to change your sanitary pad or tampon more than once every 2 hours, stay in bed with your feet elevated and a cold pack on your lower abdomen. Rest as much as possible, until the bleeding stops or slows down.  Eat well-balanced meals. Eat foods high in iron. Examples are:  Leafy green vegetables.  Whole-grain breads and cereals.  Eggs.  Meat.  Liver.  Do not try to lose weight until the abnormal bleeding has stopped and your blood iron level is back to normal. Do not lift more than ten pounds or do strenuous activities when you are bleeding.  For a couple of months, make note on your calendar, marking the start and ending of your period, and the type of bleeding (light, medium, heavy, spotting, clots or missed periods). This is for your caregiver to better evaluate your problem. SEEK MEDICAL CARE IF:   You develop nausea (feeling  sick to your stomach) and vomiting, dizziness, or diarrhea while you are taking your medicine.  You are getting lightheaded or weak.  You have any problems that may be related to the medicine you are taking.  You develop pain with your DUB.  You want to remove your IUD.  You want to stop or change your birth control pills or hormones.  You have any type of abnormal bleeding mentioned above.  You are over 33 years old and have not had a menstrual period yet.  You are 28 years old and you are still having  menstrual periods.  You have any of the symptoms mentioned above.  You develop a rash. SEEK IMMEDIATE MEDICAL CARE IF:   An oral temperature above 102 F (38.9 C) develops.  You develop chills.  You are changing your sanitary pad or tampon more than once an hour.  You develop abdominal pain.  You pass out or faint. Document Released: 02/16/2000 Document Revised: 05/13/2011 Document Reviewed: 01/17/2009 [facility] Patient Information 2015 [place], [place]. This information is not intended to replace advice given to you by your health care provider. Make sure you discuss any questions you have with your health care provider.

## 2013-11-30 LAB — GC/CHLAMYDIA PROBE AMP
CT PROBE, AMP APTIMA: NEGATIVE
GC PROBE AMP APTIMA: NEGATIVE

## 2013-11-30 NOTE — MAU Provider Note (Signed)
Attestation of Attending Supervision of Advanced Practitioner (PA/CNM/NP): Evaluation and management procedures were performed by the Advanced Practitioner under my supervision and collaboration.  I have reviewed the Advanced Practitioner's note and chart, and I agree with the management and plan.  Jacob Stinson, DO Attending Physician Faculty Practice, [facility]  

## 2013-12-02 ENCOUNTER — Other Ambulatory Visit: Payer: Self-pay | Admitting: Advanced Practice Midwife

## 2013-12-02 MED ORDER — FERROUS SULFATE 325 (65 FE) MG PO TABS: 325.0000 mg | ORAL_TABLET | Freq: Every day | ORAL | Status: DC

## 2013-12-02 MED ORDER — PRENATAL VITAMIN 27-0.8 MG PO TABS: 1.0000 | ORAL_TABLET | Freq: Every day | ORAL | Status: DC

## 2013-12-02 NOTE — MAU Provider Note (Signed)
Attestation of Attending Supervision of Advanced Practitioner (PA/CNM/NP): Evaluation and management procedures were performed by the Advanced Practitioner under my supervision and collaboration.  I have reviewed the Advanced Practitioner's note and chart, and I agree with the management and plan.  Jacob Stinson, DO Attending Physician Faculty Practice, [facility]  

## 2014-01-03 ENCOUNTER — Encounter (HOSPITAL_COMMUNITY): Payer: Self-pay | Admitting: *Deleted

## 2014-02-16 ENCOUNTER — Encounter (HOSPITAL_COMMUNITY): Payer: Self-pay | Admitting: Emergency Medicine

## 2014-02-16 ENCOUNTER — Emergency Department (HOSPITAL_COMMUNITY)
Admission: EM | Admit: 2014-02-16 | Discharge: 2014-02-16 | Disposition: A | Discharge status: Home / Self Care | Payer: BC Managed Care – PPO | Attending: Emergency Medicine | Admitting: Emergency Medicine

## 2014-02-16 DIAGNOSIS — J039 Acute tonsillitis, unspecified: Secondary | ICD-10-CM | POA: Diagnosis not present

## 2014-02-16 DIAGNOSIS — E669 Obesity, unspecified: Secondary | ICD-10-CM | POA: Diagnosis not present

## 2014-02-16 DIAGNOSIS — D649 Anemia, unspecified: Secondary | ICD-10-CM | POA: Diagnosis not present

## 2014-02-16 DIAGNOSIS — Z8742 Personal history of other diseases of the female genital tract: Secondary | ICD-10-CM | POA: Diagnosis not present

## 2014-02-16 DIAGNOSIS — Z79899 Other long term (current) drug therapy: Secondary | ICD-10-CM | POA: Diagnosis not present

## 2014-02-16 DIAGNOSIS — J029 Acute pharyngitis, unspecified: Secondary | ICD-10-CM | POA: Diagnosis present

## 2014-02-16 LAB — RAPID STREP SCREEN (MED CTR MEBANE ONLY): Streptococcus, Group A Screen (Direct): NEGATIVE

## 2014-02-16 MED ORDER — DEXAMETHASONE SODIUM PHOSPHATE 10 MG/ML IJ SOLN: 10.0000 mg | Freq: Once | INTRAMUSCULAR | Status: DC

## 2014-02-16 MED ORDER — AMOXICILLIN-POT CLAVULANATE 875-125 MG PO TABS
1.0000 | ORAL_TABLET | Freq: Two times a day (BID) | ORAL | Status: DC
Start: 2014-02-16 — End: 2014-08-16

## 2014-02-16 MED ORDER — IBUPROFEN 400 MG PO TABS
800.0000 mg | ORAL_TABLET | Freq: Once | ORAL | Status: AC
  Administered 2014-02-16: 800 mg via ORAL
  Filled 2014-02-16: qty 4

## 2014-02-16 MED ORDER — DEXAMETHASONE 4 MG PO TABS
10.0000 mg | ORAL_TABLET | Freq: Once | ORAL | Status: AC
Start: 2014-02-16 — End: 2014-02-16
  Administered 2014-02-16: 10 mg via ORAL
  Filled 2014-02-16: qty 3

## 2014-02-16 MED ORDER — AMOXICILLIN-POT CLAVULANATE 875-125 MG PO TABS
1.0000 | ORAL_TABLET | Freq: Once | ORAL | Status: AC
  Administered 2014-02-16: 1 via ORAL
  Filled 2014-02-16: qty 1

## 2014-02-16 MED ORDER — IBUPROFEN 800 MG PO TABS: 800.0000 mg | ORAL_TABLET | Freq: Once | ORAL | Status: DC

## 2014-02-16 NOTE — ED Notes (Signed)
Refused wheelchair 

## 2014-02-16 NOTE — Discharge Instructions (Signed)
Return to the ER with any swelling in your throat, difficulty breathing, difficulty swallowing, difficulty tolerating secretions or fluids. Return if he developed a high fever greater than 100.59F, nausea, vomiting.  Tonsillitis Tonsillitis is an infection of the throat that causes the tonsils to become red, tender, and swollen. Tonsils are collections of lymphoid tissue at the back of the throat. Each tonsil has crevices (crypts). Tonsils help fight nose and throat infections and keep infection from spreading to other parts of the body for the first 18 months of life.  CAUSES Sudden (acute) tonsillitis is usually caused by infection with streptococcal bacteria. Long-lasting (chronic) tonsillitis occurs when the crypts of the tonsils become filled with pieces of food and bacteria, which makes it easy for the tonsils to become repeatedly infected. SYMPTOMS  Symptoms of tonsillitis include:  A sore throat, with possible difficulty swallowing.  White patches on the tonsils.  Fever.  Tiredness.  New episodes of snoring during sleep, when you did not snore before.  Small, foul-smelling, yellowish-white pieces of material (tonsilloliths) that you occasionally cough up or spit out. The tonsilloliths can also cause you to have bad breath. DIAGNOSIS Tonsillitis can be diagnosed through a physical exam. Diagnosis can be confirmed with the results of lab tests, including a throat culture. TREATMENT  The goals of tonsillitis treatment include the reduction of the severity and duration of symptoms and prevention of associated conditions. Symptoms of tonsillitis can be improved with the use of steroids to reduce the swelling. Tonsillitis caused by bacteria can be treated with antibiotic medicines. Usually, treatment with antibiotic medicines is started before the cause of the tonsillitis is known. However, if it is determined that the cause is not bacterial, antibiotic medicines will not treat the  tonsillitis. If attacks of tonsillitis are severe and frequent, your health care provider may recommend surgery to remove the tonsils (tonsillectomy). HOME CARE INSTRUCTIONS   Rest as much as possible and get plenty of sleep.  Drink plenty of fluids. While the throat is very sore, eat soft foods or liquids, such as sherbet, soups, or instant breakfast drinks.  Eat frozen ice pops.  Gargle with a warm or cold liquid to help soothe the throat. Mix 1/4 teaspoon of salt and 1/4 teaspoon of baking soda in 8 oz of water. SEEK MEDICAL CARE IF:   Large, tender lumps develop in your neck.  A rash develops.  A green, yellow-brown, or bloody substance is coughed up.  You are unable to swallow liquids or food for 24 hours.  You notice that only one of the tonsils is swollen. SEEK IMMEDIATE MEDICAL CARE IF:   You develop any new symptoms such as vomiting, severe headache, stiff neck, chest pain, or trouble breathing or swallowing.  You have severe throat pain along with drooling or voice changes.  You have severe pain, unrelieved with recommended medications.  You are unable to fully open the mouth.  You develop redness, swelling, or severe pain anywhere in the neck.  You have a fever. MAKE SURE YOU:   Understand these instructions.  Will watch your condition.  Will get help right away if you are not doing well or get worse. Document Released: 11/28/2004 Document Revised: 07/05/2013 Document Reviewed: 08/07/2012 [facility] Patient Information 2015 [place]. This information is not intended to replace advice given to you by your health care provider. Make sure you discuss any questions you have with your health care provider.   Emergency Department Resource Guide 1) Find a Doctor and  Pay Out of Pocket Although you won't have to find out who is covered by your insurance plan, it is a good idea to ask around and get recommendations. You will then need to call the office and see if  the doctor you have chosen will accept you as a new patient and what types of options they offer for patients who are self-pay. Some doctors offer discounts or will set up payment plans for their patients who do not have insurance, but you will need to ask so you aren't surprised when you get to your appointment.  2) Contact Your Local Health Department Not all health departments have doctors that can see patients for sick visits, but many do, so it is worth a call to see if yours does. If you don't know where your local health department is, you can check in your phone book. The CDC also has a tool to help you locate your state's health department, and many state websites also have listings of all of their local health departments.  3) Find a Walk-in Clinic If your illness is not likely to be very severe or complicated, you may want to try a walk in clinic. These are popping up all over the country in pharmacies, drugstores, and shopping centers. They're usually staffed by nurse practitioners or physician assistants that have been trained to treat common illnesses and complaints. They're usually fairly quick and inexpensive. However, if you have serious medical issues or chronic medical problems, these are probably not your best option.  No Primary Care Doctor: - Call Health Connect at  [phone] - they can help you locate a primary care doctor that  accepts your insurance, provides certain services, etc. - Physician Referral Service- [phone]  Chronic Pain Problems: Organization         Address  Phone   Notes  [facility]  [phone]) [phone] Patients need to be referred by their primary care doctor.   Medication Assistance: Organization         Address  Phone   Notes  [facility] [address] [place], [place] [address] [phone]) [phone] --Must be a resident of [place] -- Must have NO insurance coverage whatsoever (no  Medicaid/ Medicare, etc.) -- The pt. MUST have a primary care doctor that directs their care regularly and follows them in the community   Med[facility]ist  [phone]) [phone]   [employer]  [phone]    Agencies that provide inexpensive medical care: Organization         Address  Phone   Notes  [facility]  [phone]   [facility]    [phone]   [facility] [address] [place], [place] [address] [phone]) [phone]   [facility] [address], [place] [phone]   [facility]    [phone](336) [phone]   [facility]    [phone]) [phone]   [facility]  [address], [place] Phone:  [phone], Fax:  [phone] Hours of Operation:  9 am - 6 pm, M-F.  Also accepts Medicaid/Medicare and self-pay.  [facility]  [address], [place] Phone: [phone]) [phone], Fax: [phone]) [phone]. Hours of Operation:  8:30 am - 5:30 pm, M-F.  Also accepts Medicaid and self-pay.  [facility] [place] [address], [place] Phone: [phone]) [phone]  [facility] [address] Natasha Bence [place], [place] [phone], Ext. [phone] Mondays & Thursdays: 7-9 AM.  First 15 patients are seen on a first come, first serve basis.    Medicaid-accepting [facility] Providers:  Organization         Address  Phone   Notes  [facility] [address], [place] [phone] Also accepts self-pay patients.  [facility] [address] Laurell Josephs [place], [place]  [phone]   [facility] [address], [place] [phone]   [facility] [address], [place] [phone]   Renaye Rakers [address], [place]   [phone] Only accepts [place] Access [place] patients after they have their name applied to their card.    Self-Pay (no insurance) in [facility]:  Organization         Address  Phone   Notes  Sickle Cell Patients, [facility] [address] [place], [place] [phone]   [facility] [address] [place], [place] [phone]   [facility]  [address], [place] [phone]   [facility]/Dr. Osei-Bonsu  [address], [place] or [address], [place] [phone] Phone number for both [place] and [place] locations is the same.  Urgent Medical and [facility] [address], [place] [phone]   [facility] [address], [place] or [address] [phone]   [facility] [address], [place] [phone], phone; [phone], fax Sees patients 1st and 3rd Saturday of every month.  Must not qualify for public or private insurance (i.e. Medicaid, Medicare, [place] Health Choice, Veterans' Benefits)  Household income should be no more than 200% of the poverty level The clinic cannot treat you if you are pregnant or think you are pregnant  Sexually transmitted diseases are not treated at the clinic.    Dental Care: Organization         Address  Phone  Notes  [facility] Department of [facility] [address] [place], [place] [phone] Accepts children up to age 62 who are enrolled in [place] or [place] Health Choice; pregnant women with a Medicaid card; and children who have applied for Medicaid or [place] Health Choice, but were declined, whose parents can pay a reduced fee at time of service.  [facility]  [address], [place] [phone] Accepts children up to age 59 who are enrolled in [place] or [place] Health Choice; pregnant women with a Medicaid card; and children who have applied for Medicaid or [place] Health Choice,  but were declined, whose parents can pay a reduced fee at time of service.  [facility]  [address] [place], [place] [phone] Patients are seen by appointment only. Walk-ins are not accepted. [facility] will see patients 9 years of age and older. Monday - Tuesday (8am-5pm) Most Wednesdays (8:30-5pm) $30 per visit, cash only  [facility] Access PRO[facility]  [address], [facility] [phone] Patients are seen by appointment only. Walk-ins are not accepted. [facility] will see patients 6 years of age and older. One Wednesday Evening (Monthly: Volunteer Based).  $30 per visit,  cash only  [employer] of Dentistry Clinics  [phone] for adults; Children under age 55, call Graduate Pediatric Dentistry at [phone]. Children aged 19-14, please call [phone] to request a pediatric application.  Dental services are provided in all areas of dental care including fillings, crowns and bridges, complete and partial dentures, implants, gum treatment, root canals, and extractions. Preventive care is also provided. Treatment is provided to both adults and children. Patients are selected via a lottery and there is often a waiting list.   [facility] [address] [place]  [phone] [email or web address]   [facility] [address] [place], [place] [phone], Ext. [phone] Second and Fourth Thursday of each month, opens at 6:30 AM; Clinic ends at 9 AM.  Patients are seen on a first-come first-served basis, and a limited number are seen during each clinic.   [facility]  [address] Ether Griffins [place], [place] [phone]   Eligibility Requirements You must have lived in [place], [place], or [place] counties for at least the last three months.   You cannot be eligible for state or federal sponsored [place], including [employer], [place], or [employer].   You generally  cannot be eligible for healthcare insurance through your employer.    How to apply: Eligibility screenings are held every Tuesday and Wednesday afternoon from 1:00 pm until 4:00 pm. You do not need an appointment for the interview!  [facility] [address] [place], [place] [phone]   [facility] Department  [phone]   [facility] Health Department  [phone]   [facility]  [phone]    Behavioral Health Resources in the Community: Intensive Outpatient Programs Organization         Address  Phone  Notes  [facility] Services [address], [place], [place] [phone]   [facility]patient [address] [place], [place] [phone]   [facility] [address] [place], [place]  [phone]   [facility] [address]  [place], [place] [phone] or [phone]   Substance Abuse Resources Organization         Address  Phone  Notes  [facility] Drug Services  [phone]   [facility]  [phone]   [facility]  [phone]   Floydene Flock  [phone]   Residential & Outpatient Substance Abuse Program  [phone]   Psychological Services Organization         Address  Phone  Notes  [facility] Behavior[facility] Health  [phone]   [facility] Services  [phone]   [facility] Mental Health [address], [place] [phone] or [phone]    Mobile Crisis Teams Organization         Address  Phone  Notes  [facility], Mobile Crisis Care Unit  [phone]   [facility]  [address] [place], [place] [phone]   Doristine Locks [address] [place] [phone]    Self-Help/Support Groups Organization         Address  Phone             Notes  [facility] [place] - variety of support groups  [phone]- I7437963 Call for more  information  Narcotics Anonymous (NA), [facility] [address], [place]  2 meetings at this location   Statistician  Address  Phone  Notes  [facility] [address],    [place]  [phone]   [facility]  [address] [place] [address], [place], [place] [phone]   [facility] [address] [place], [place] [phone] Admissions: 8am-3pm M-F  [facility] [address],    [place], [place] [phone]   [facility] [address] [place], [place], [place] [phone]   [facility] [address],  [place], [place] [phone]   [facility] - Intensive Outpatient [address] Laurell Joseph[address], [place], [place] [phone]   [facility] (Addiction Recovery [facility] [address] [place]  [place], [place] [phone] or [phone]   [facility] ([facility]) [address], [place], [place] [phone] Accepts Medicaid  [facility] [address]  [place] [phone] Substance Abuse/Addiction Treatment   [facility] Organization         Address  Phone  Notes  [facility]  [phone]   Angie Fava, PhD [address] Ervin Knack [place], [place]   [phone] or [phone]   [facility] Behavioral   [address] [place], [place] [phone]   [facility] [address], [place], [place] [phone] Insurance/Medicaid/sponsorship through [facility] and Families [address]                                    [place], [place] [phone] Therapy/tele-psych/case  [facility] [address], [place] [phone]    Dr. Lolly Mustache  [phone]   [facility]  [employer] [facility] Dept. 1) [address], [place] 2) [address], [place] 3)  [address], [place] [phone]  [phone]   [facility] Child Abuse Hotline 630-114-3974 or [phone] (After Hours)

## 2014-02-16 NOTE — ED Provider Notes (Signed)
CSN: [id number]     Arrival date & time 02/16/14  1423 History  This chart was scribed for non-physician practitioner, Jinny SandersJoseph Bliss Behnke, PA-C, working with Lyanne CoKevin M Campos, MD by Charline BillsEssence Howell, ED Scribe. This patient was seen in room TR06C/TR06C and the patient's care was started at 2:54 PM.   Chief Complaint  Patient presents with  . Sore Throat   The history is provided by the patient. No language interpreter was used.   HPI Comments: Regina Watkins is a 28 y.o. female, with a h/o anemia, who presents to the Emergency Department complaining of gradually worsening sore throat over the past 2 days. Pain is exacerbated with swallowing. She reports associated HA, cough, generalized body aches. Pt denies fever, congestion, trouble swallowing. She also denies new medications.  Past Medical History  Diagnosis Date  . No pertinent past medical history   . Strep throat   . BV (bacterial vaginosis)   . Trichomonas   . Obesity   . Ectopic pregnancy   . Anemia    Past Surgical History  Procedure Laterality Date  . Salpingectomy     Family History  Problem Relation Age of Onset  . Heart disease Mother   . Diabetes Mother   . Diabetes Father   . Heart disease Father   . Anesthesia problems Neg Hx   . Hypotension Neg Hx   . Malignant hyperthermia Neg Hx   . Pseudochol deficiency Neg Hx    History  Substance Use Topics  . Smoking status: Never Smoker   . Smokeless tobacco: Never Used  . Alcohol Use: No   OB History    Gravida Para Term Preterm AB TAB SAB Ectopic Multiple Living   1 0 0 0 1 0 0 1 0 0      Review of Systems  Constitutional: Negative for fever.  HENT: Positive for sore throat. Negative for congestion and trouble swallowing.   Respiratory: Positive for cough.   Musculoskeletal: Positive for myalgias.  Neurological: Positive for headaches.   Allergies  Review of patient's allergies indicates no known allergies.  Home Medications   Prior to Admission  medications   Medication Sig Start Date End Date Taking? Authorizing Provider  Phenyleph-CPM-DM-APAP (ALKA-SELTZER PLUS COLD & COUGH) 07-03-08-325 MG CAPS Take 2 capsules by mouth daily as needed (for cold).   Yes Historical Provider, MD  amoxicillin-clavulanate (AUGMENTIN) 875-125 MG per tablet Take 1 tablet by mouth 2 (two) times daily. 02/16/14   Monte FantasiaJoseph W Danyelle [place], PA-C  ferrous sulfate 325 (65 FE) MG tablet Take 1 tablet (325 mg total) by mouth daily with breakfast. 12/02/13   Jacklyn ShellFrances Cresenzo-Dishmon, CNM  ibuprofen (ADVIL,MOTRIN) 800 MG tablet Take 1 tablet (800 mg total) by mouth once. 02/16/14   Monte FantasiaJoseph W Peachie Barkalow, PA-C  megestrol (MEGACE) 40 MG tablet Take 3/day (at the same time) for 5 days; 2/day for 5 days, then 1/day PO prn bleeding 11/29/13   Jacklyn ShellFrances Cresenzo-Dishmon, CNM  Prenatal Vit-Fe Fumarate-FA (PRENATAL VITAMIN) 27-0.8 MG TABS Take 1 tablet by mouth daily. 12/02/13   Jacklyn ShellFrances Cresenzo-Dishmon, CNM   Triage Vitals: BP 145/85 mmHg  Pulse 116  Temp(Src) 98.3 F (36.8 C) (Oral)  Resp 20  SpO2 97% Physical Exam  Constitutional: She is oriented to person, place, and time. She appears well-developed and well-nourished. No distress.  HENT:  Head: Normocephalic and atraumatic.  Both tonsils are erythematous and swollen, R tonsil greater than L.  Eyes: Conjunctivae and EOM are normal.  Neck: Neck supple. No tracheal  deviation present.  Cardiovascular: Normal rate.   Pulmonary/Chest: Effort normal and breath sounds normal. No respiratory distress.  Musculoskeletal: Normal range of motion.  Lymphadenopathy:    She has cervical adenopathy (mild anterior).  Neurological: She is alert and oriented to person, place, and time.  Skin: Skin is warm and dry.  Psychiatric: She has a normal mood and affect. Her behavior is normal.  Nursing note and vitals reviewed.  ED Course  Procedures (including critical care time) DIAGNOSTIC STUDIES: Oxygen Saturation is 97% on RA, normal by my  interpretation.    COORDINATION OF CARE: 2:59 PM-Discussed treatment plan which includes strep screen, Decadron injection, Augmentin and Motrin with pt at bedside and pt agreed to plan.   Labs Review Labs Reviewed  RAPID STREP SCREEN  CULTURE, GROUP A STREP   Imaging Review No results found.   EKG Interpretation None      MDM   Final diagnoses:  Acute pharyngitis, unspecified pharyngitis type  Acute tonsillitis   Pt afebrile with tonsillar swelling right greater than left, cervical lymphadenopathy, & dysphagia; diagnosis of strep. Treated in the Ed with steroids, NSAIDs, Augmentin.  Pt appears mildly dehydrated, discussed importance of water rehydration. Presentation concerning for very mild, early PTA based on asymmetric right tonsil slightly greater than left, however tonsil is not occluding or obscuring airway or oropharynx. Patient appears to have enlarged tonsils at baseline . No concern for spread to soft tissue. No trismus or uvula deviation. Specific, strict return precautions discussed and patient agreeable to this plan. Pt able to drink water in ED without difficulty with intact air way. I encouraged patient to call or return to ER should she have any questions or concerns.  I personally performed the services described in this documentation, which was scribed in my presence. The recorded information has been reviewed and is accurate.  BP 145/85 mmHg  Pulse 116  Temp(Src) 98.3 F (36.8 C) (Oral)  Resp 20  SpO2 97%  Signed,  Ladona MowJoe Lashon Hillier, PA-C 6:01 PM  Patient seen and discussed with Dr. Tilden FossaElizabeth Rees, M.D.  Monte FantasiaJoseph W Zarie Kosiba, PA-C 02/16/14 1801  Tilden FossaElizabeth Rees, MD 02/16/14 2034

## 2014-02-16 NOTE — ED Provider Notes (Signed)
Medical screening examination/treatment/procedure(s) were conducted as a shared visit with non-physician practitioner(s) and myself.  I personally evaluated the patient during the encounter.   EKG Interpretation None      Pt here with sore throat.  No PTA on exam, but treating with abx due to slight asymmetry, concern for potential very early PTA.  Discussed close return precautions.    Tilden FossaElizabeth Gerrett Loman, MD 02/16/14 2034

## 2014-02-16 NOTE — ED Notes (Signed)
Pt c/o sore throat and body aches x 2 days.  

## 2014-02-18 LAB — CULTURE, GROUP A STREP

## 2014-04-11 ENCOUNTER — Emergency Department (HOSPITAL_COMMUNITY): Payer: No Typology Code available for payment source

## 2014-04-11 ENCOUNTER — Emergency Department (HOSPITAL_COMMUNITY)
Admission: EM | Admit: 2014-04-11 | Discharge: 2014-04-11 | Disposition: A | Discharge status: Home / Self Care | Payer: No Typology Code available for payment source | Attending: Emergency Medicine | Admitting: Emergency Medicine

## 2014-04-11 ENCOUNTER — Encounter (HOSPITAL_COMMUNITY): Payer: Self-pay | Admitting: Emergency Medicine

## 2014-04-11 DIAGNOSIS — N644 Mastodynia: Secondary | ICD-10-CM | POA: Insufficient documentation

## 2014-04-11 DIAGNOSIS — Z8619 Personal history of other infectious and parasitic diseases: Secondary | ICD-10-CM | POA: Insufficient documentation

## 2014-04-11 DIAGNOSIS — Z8709 Personal history of other diseases of the respiratory system: Secondary | ICD-10-CM | POA: Insufficient documentation

## 2014-04-11 DIAGNOSIS — D649 Anemia, unspecified: Secondary | ICD-10-CM | POA: Insufficient documentation

## 2014-04-11 DIAGNOSIS — R079 Chest pain, unspecified: Secondary | ICD-10-CM

## 2014-04-11 DIAGNOSIS — R0789 Other chest pain: Secondary | ICD-10-CM | POA: Insufficient documentation

## 2014-04-11 DIAGNOSIS — Z79899 Other long term (current) drug therapy: Secondary | ICD-10-CM | POA: Insufficient documentation

## 2014-04-11 DIAGNOSIS — E669 Obesity, unspecified: Secondary | ICD-10-CM | POA: Insufficient documentation

## 2014-04-11 DIAGNOSIS — Z3202 Encounter for pregnancy test, result negative: Secondary | ICD-10-CM | POA: Insufficient documentation

## 2014-04-11 DIAGNOSIS — Z8742 Personal history of other diseases of the female genital tract: Secondary | ICD-10-CM | POA: Insufficient documentation

## 2014-04-11 LAB — CBC
HEMATOCRIT: 34.1 % — AB (ref 36.0–46.0)
HEMOGLOBIN: 11 g/dL — AB (ref 12.0–15.0)
MCH: 23 pg — ABNORMAL LOW (ref 26.0–34.0)
MCHC: 32.3 g/dL (ref 30.0–36.0)
MCV: 71.3 fL — ABNORMAL LOW (ref 78.0–100.0)
Platelets: 390 10*3/uL (ref 150–400)
RBC: 4.78 MIL/uL (ref 3.87–5.11)
RDW: 17.5 % — ABNORMAL HIGH (ref 11.5–15.5)
WBC: 8.4 10*3/uL (ref 4.0–10.5)

## 2014-04-11 LAB — BASIC METABOLIC PANEL
ANION GAP: 5 (ref 5–15)
BUN: 8 mg/dL (ref 6–23)
CO2: 28 mmol/L (ref 19–32)
Calcium: 9 mg/dL (ref 8.4–10.5)
Chloride: 106 mmol/L (ref 96–112)
Creatinine, Ser: 0.75 mg/dL (ref 0.50–1.10)
GFR calc Af Amer: >90 mL/min (ref >90)
GFR calc non Af Amer: >90 mL/min (ref >90)
Glucose, Bld: 94 mg/dL (ref 70–99)
POTASSIUM: 3.9 mmol/L (ref 3.5–5.1)
SODIUM: 139 mmol/L (ref 135–145)

## 2014-04-11 LAB — I-STAT TROPONIN, ED: Troponin i, poc: 0 ng/mL (ref 0.00–0.08)

## 2014-04-11 LAB — PREGNANCY, URINE: Preg Test, Ur: NEGATIVE

## 2014-04-11 MED ORDER — IBUPROFEN 800 MG PO TABS
800.0000 mg | ORAL_TABLET | Freq: Once | ORAL | Status: AC
  Administered 2014-04-11: 800 mg via ORAL
  Filled 2014-04-11: qty 1

## 2014-04-11 NOTE — ED Notes (Signed)
Pt placed on monitor upon arrival to room from restroom. Pt remains monitored by blood pressure, pulse ox, and 5 lead. pts family remains at bedside.

## 2014-04-11 NOTE — ED Provider Notes (Signed)
CSN: [id number]     Arrival date & time 04/11/14  1038 History   First MD Initiated Contact with Patient 04/11/14 1503     Chief Complaint  Patient presents with  . Chest Pain  . Numbness     (Consider location/radiation/quality/duration/timing/severity/associated sxs/prior Treatment) The history is provided by the patient and medical records.    This is a 29 year old female with past medical history significant for anemia and ectopic pregnancy, presenting to the ED with a several week history of intermittent chest pain, left arm tingling/numbness, and bilateral breast pain that became constant this morning. Pain is 6/10, described as tightness/tingling, located sub-sternally with radiation to both breasts, associated with cough and headache, both alleviated by OTC ibuprofen. Patient admits to sleeping on her left side and believes this could be contributing to her symptoms in her left arm. Patient was directed to be evaluated by her mother because her father is deceased due to CHF at a "young age". She also has strong family history of breast cancer (maternal grandmother).  Patient has been able ambulate and preform ADLs without difficulty. Denies shortness of breath, congestion, fever, vision changes, chills, hemoptysis, body aches, rhinorrhea, diaphoresis, nausea, vomiting, diarrhea, and constipation. LMP 3 months ago. NKDA. Vital signs stable on arrival.   Past Medical History  Diagnosis Date  . No pertinent past medical history   . Strep throat   . BV (bacterial vaginosis)   . Trichomonas   . Obesity   . Ectopic pregnancy   . Anemia    Past Surgical History  Procedure Laterality Date  . Salpingectomy     Family History  Problem Relation Age of Onset  . Heart disease Mother   . Diabetes Mother   . Diabetes Father   . Heart disease Father   . Anesthesia problems Neg Hx   . Hypotension Neg Hx   . Malignant hyperthermia Neg Hx   . Pseudochol deficiency Neg Hx    History   Substance Use Topics  . Smoking status: Never Smoker   . Smokeless tobacco: Never Used  . Alcohol Use: No   OB History    Gravida Para Term Preterm AB TAB SAB Ectopic Multiple Living       Review of Systems  Constitutional: Negative for fever, chills and diaphoresis.  Eyes: Negative for visual disturbance.  Respiratory: Positive for cough and chest tightness.   Cardiovascular: Positive for chest pain.  Gastrointestinal: Negative for nausea, vomiting and diarrhea.  All other systems reviewed and are negative.     Allergies  Review of patient's allergies indicates no known allergies.  Home Medications   Prior to Admission medications   Medication Sig Start Date End Date Taking? Authorizing Provider  ibuprofen (ADVIL,MOTRIN) 800 MG tablet Take 1 tablet (800 mg total) by mouth once. 02/16/14  Yes Monte Fantasia, PA-C  megestrol (MEGACE) 40 MG tablet Take 3/day (at the same time) for 5 days; 2/day for 5 days, then 1/day PO prn bleeding 11/29/13  Yes Jacklyn Shell, CNM  amoxicillin-clavulanate (AUGMENTIN) 875-125 MG per tablet Take 1 tablet by mouth 2 (two) times daily. Patient not taking: Reported on 04/11/2014 02/16/14   Monte Fantasia, PA-C  ferrous sulfate 325 (65 FE) MG tablet Take 1 tablet (325 mg total) by mouth daily with breakfast. Patient not taking: Reported on 04/11/2014 12/02/13   Jacklyn Shell, CNM  Prenatal Vit-Fe Fumarate-FA (PRENATAL VITAMIN) 27-0.8 MG TABS Take 1 tablet  by mouth daily. Patient not taking: Reported on 04/11/2014 12/02/13   Jacklyn Shell, CNM   BP 111/72 mmHg  Pulse 75  Temp(Src) 98.2 F (36.8 C) (Oral)  Resp 17  Ht  (1.676 m)  Wt 302 lb 7 oz (137.185 kg)  BMI 48.84 kg/m2  SpO2 100%  LMP 01/09/2014   Physical Exam  Constitutional: She is oriented to person, place, and time. She appears well-developed and well-nourished. No distress.  HENT:  Head: Normocephalic and atraumatic.  Right Ear:  Tympanic membrane normal.  Left Ear: Tympanic membrane normal.  Mouth/Throat: Oropharynx is clear and moist. No posterior oropharyngeal edema or posterior oropharyngeal erythema.  Eyes: Conjunctivae and EOM are normal. Pupils are equal, round, and reactive to light.  Neck: Normal range of motion and full passive range of motion without pain. Neck supple. No rigidity.  No meningismus  Cardiovascular: Normal rate, regular rhythm and normal heart sounds.   Pulses:      Radial pulses are 2+ on the right side, and 2+ on the left side.       Dorsalis pedis pulses are 1+ on the right side, and 1+ on the left side.  Pulmonary/Chest: Effort normal and breath sounds normal. No respiratory distress. She has no wheezes.  Abdominal: Soft. Bowel sounds are normal. There is no tenderness.  Genitourinary:  No masses or cysts noted on breast exam; no nipple discharge; no signs of abscess formation or infection  Musculoskeletal: Normal range of motion.  Neurological: She is alert and oriented to person, place, and time.  AAOx3, answering questions appropriately; equal strength UE and LE bilaterally 5/5; CN grossly intact; moves all extremities appropriately without ataxia; no focal neuro deficits or facial asymmetry appreciated  Skin: Skin is warm and dry. She is not diaphoretic.  Psychiatric: She has a normal mood and affect.  Nursing note and vitals reviewed.   ED Course  Procedures (including critical care time) Labs Review Labs Reviewed  CBC - Abnormal; Notable for the following:    Hemoglobin 11.0 (*)    HCT 34.1 (*)    MCV 71.3 (*)    MCH 23.0 (*)    RDW 17.5 (*)    All other components within normal limits  BASIC METABOLIC PANEL  PREGNANCY, URINE  I-STAT TROPOININ, ED  POC URINE PREG, ED    Imaging Review Dg Chest 2 View (if Patient Has Fever And/or Copd)  04/11/2014   CLINICAL DATA:  Chest pain and bilateral arm numbness for 2 weeks.  EXAM: CHEST  2 VIEW  COMPARISON:  None.  FINDINGS:  Heart size and mediastinal contours are within normal limits. Both lungs are clear. Visualized skeletal structures are unremarkable.  IMPRESSION: Negative exam.   Electronically Signed   By: Drusilla Kanner M.D.   On: 04/11/2014 11:59     EKG Interpretation None      MDM   Final diagnoses:  Chest pain, unspecified chest pain type  Breast tenderness in female   29 year old female with complaint of chest pain and left arm tingling as well as bilateral breast pain. Patient has no known cardiac history. EKG sinus rhythm without ischemic changes. Lab work is reassuring, troponin negative. Chest x-ray is clear. I have low suspicion for ACS, PE, dissection, or other acute cardiac event. Patient is PERC negative.  Patient's neurologic exam is nonfocal, suspect that the paresthesias in her left arm may be due to sleeping on her arm. She has no weakness.  Patient's breast exam is also  normal, I do not feel any masses or cysts and there are no signs of infection or abscess formation. Patient does have strong family history of breast cancer, therefore i have ordered outpatient ultrasound for her. She is encouraged follow-up with a primary care physician.  Discussed plan with patient, he/she acknowledged understanding and agreed with plan of care.  Return precautions given for new or worsening symptoms.  Garlon HatchetLisa M Sanders, PA-C 04/11/14 1915  Juliet RudeNathan R. Rubin PayorPickering, MD 04/12/14 2148

## 2014-04-11 NOTE — ED Notes (Signed)
Pt denies any chest pain at this time. Pt denies any cardiac hx/diabetes/hypertension/elevated cholesterol/etc. Pt VSS, placed on cardiac monitor.

## 2014-04-11 NOTE — Discharge Instructions (Signed)
i have ordered outpatient ultrasound for your breast, call breast center to schedule appt when it is convenient for you. Follow-up with a primary care physician in the area.  i have included Dr. Delford FieldKnapp's information as well as the resource guide to help. Return to the ED for new or worsening symptoms.   Emergency Department Resource Guide 1) Find a Doctor and Pay Out of Pocket Although you won't have to find out who is covered by your insurance plan, it is a good idea to ask around and get recommendations. You will then need to call the office and see if the doctor you have chosen will accept you as a new patient and what types of options they offer for patients who are self-pay. Some doctors offer discounts or will set up payment plans for their patients who do not have insurance, but you will need to ask so you aren't surprised when you get to your appointment.  2) Contact Your Local Health Department Not all health departments have doctors that can see patients for sick visits, but many do, so it is worth a call to see if yours does. If you don't know where your local health department is, you can check in your phone book. The CDC also has a tool to help you locate your state's health department, and many state websites also have listings of all of their local health departments.  3) Find a Walk-in Clinic If your illness is not likely to be very severe or complicated, you may want to try a walk in clinic. These are popping up all over the country in pharmacies, drugstores, and shopping centers. They're usually staffed by nurse practitioners or physician assistants that have been trained to treat common illnesses and complaints. They're usually fairly quick and inexpensive. However, if you have serious medical issues or chronic medical problems, these are probably not your best option.  No Primary Care Doctor: - Call Health Connect at  [phone] - they can help you locate a primary care doctor that   accepts your insurance, provides certain services, etc. - Physician Referral Service- [phone]  Chronic Pain Problems: Organization         Address  Phone   Notes  [facility]  [phone] Patients need to be referred by their primary care doctor.   Medication Assistance: Organization         Address  Phone   Notes  [facility] [address] [place], [place] [address] [phone] --Must be a resident of [place] [facility] [place] -- Must have NO insurance coverage whatsoever (no Medicaid/ Medicare, etc.) -- The pt. MUST have a primary care doctor that directs their care regularly and follows them in the community   MedAssist  [phone]   [employer]  [phone]) [phone]    Agencies that provide inexpensive medical care: Organization         Address  Phone   Notes  [facility]  [phone]   [facility]    [phone]   [facility] [address] [place], [place] [address] [phone]   [facility] [address], [place] [phone]   [facility]    [phone]) [phone]   [facility]    [phone]   [facility]  [address], [place] Phone:  [phone], Fax:  [phone] Hours of  Operation:  9 am - 6 pm, M-F.  Also accepts Medicaid/Medicare and self-pay.  [facility] for [place], [address], [place] Phone: [phone], Fax: [phone]. Hours of Operation:  8:30 am - 5:30 pm, M-F.  Also accepts Medicaid and self-pay.  [facility] [place] [address], [place] Phone: [phone]   [place], [place], [place] [phone], Ext. [phone] Mondays & Thursdays: 7-9 AM.  First 15 patients are seen on a first come, first serve basis.    [place] Providers:  Organization          Address  Phone   Notes  [facility] [address], [place] [phone] Also accepts self-pay patients.  [facility] [address], [place]  [phone]   [place], [address], [place] [phone]   [facility] [address] [place] [phone]   Lucianne Lei [address], [place]   [phone] Only accepts [place] Access [place] patients after they have their name applied to their card.   Self-Pay (no insurance) in [facility]:  Organization         Address  Phone   Notes  Sickle Cell Patients, [facility] [place] [phone]   [facility] [place] [phone]   [facility] [place]  [place], [place], [place] [phone]   [facility]/Dr. Osei-Bonsu  [address] [place] or [address], [place] [phone] Phone number for both [place] and [place] locations is the same.  [facility] [address] [place] [phone]   [facility] [address], [place] or [address] [phone]   [facility] [address], [place] [phone], phone; [phone], fax Sees patients 1st and 3rd Saturday of every month.  Must not qualify for public or private insurance (i.e. Medicaid, Medicare, [place] Health Choice, Veterans' Benefits)  Household income should be no more than 200% of the poverty level The clinic cannot treat you if you are pregnant or think you are pregnant  Sexually transmitted diseases are not treated at the clinic.    Dental Care: Organization         Address  Phone  Notes  [facility] Department of [facility] [place] [phone] Accepts children up to age 47 who are enrolled in [place] or [place]; pregnant women with a Medicaid card; and children who have applied for Medicaid or [place] Health Choice, but were declined, whose parents can pay a reduced fee at time of service.  [facility] Department of [facility]  [address], [place] [phone] Accepts children up to age 82 who are enrolled in [place] or [place]; pregnant women with a Medicaid card; and children who have applied for Medicaid or [place] Health Choice, but were declined, whose parents can pay a reduced fee at time of service.  [facility] Adult [facility] Access PRO[facility]  [place] [phone] Patients are seen by appointment only. Walk-ins are not accepted. [place] will see patients  36 years of age and older. Monday - Tuesday (8am-5pm) Most Wednesdays (8:30-5pm) $30 per visit, cash only  [facility] Adult Dental Access PROGRAM  [address], [facility] [phone] Patients are seen by appointment only. Walk-ins are not accepted. [place] will see patients 34 years of age and older. One Wednesday Evening (Monthly: Volunteer Based).  $30 per visit, cash only  [place]  [phone] for adults; Children under age 73, call Graduate Pediatric Dentistry at [phone]. Children aged 46-14, please call [phone] to request a pediatric application.  Dental services are provided in all areas of dental care including fillings, crowns and bridges, complete and partial dentures, implants, gum treatment, root canals, and extractions. Preventive care is also provided. Treatment is provided to both adults and children. Patients are selected via a lottery and there is often a waiting list.   [facility] [address] [place]  [phone] [email or web address]   [facility] [address] [place], [place]  [phone], Ext. [phone] Second and Fourth Thursday of each month, opens at 6:30 AM; Clinic ends at 9 AM.  Patients are seen on a first-come first-served basis, and a limited number are seen during each clinic.   [facility]  [address] [place], [place] [phone]   Eligibility Requirements You must have lived in [place], [place], or [place] counties for at least the last three months.   You cannot be eligible for state or federal sponsored [employer], including [employer], [place], or [employer].   You generally cannot be eligible for healthcare insurance through your employer.    How to apply: Eligibility screenings are held every Tuesday and Wednesday afternoon from 1:00 pm until 4:00 pm. You do not need an appointment for the interview!  [facility] [address] [place], [place]   [place]  [place] Department  [place]  [phone]    Behavioral Health Resources in the Community: Intensive Outpatient Programs Organization         Address  Phone  Notes  [facility] [address] [place], [place] [phone]   [facility] Outpatient [address], [place], [place]   [facility]: [facility] [address], [place], [place]   [place] [address]  [place], [place] or [phone]   Substance Abuse Resources Organization         Address  Phone  Notes  Alcohol and Drug Services  [phone]   [place]  [phone]   [facility]   Chinita Pester  [phone]   Residential & Outpatient Substance Abuse Program  [phone]   Psychological Services Organization         Address  Phone  Notes  [facility] [place]  [place]  [phone]    [place] [address], [place] or [phone]    Mobile Crisis Teams Organization         Address  Phone  Notes  [facility], Mobile Crisis Care Unit  [phone]   [facility]  [address] [place], [place]   [facility] [address] [place] [phone]    Self-Help/Support Groups Organization  Address  Phone             Notes  °[facility] [place] - variety of support groups  336- [phone] Call for more information  °Narcotics Anonymous (NA), [facility] [address], °[place]  2 meetings at this location  ° °Residential Treatment Programs °Organization         Address  Phone  Notes  °[facility] [address],    °[place]  [phone]   °[facility] ° [address], [place], [place] [phone]   °[facility] [address], [place] [phone] Admissions: 8am-3pm M-F  °[facility] [address]    °[place], [place] [phone]   °[facility] [address], [place], [place] [phone]   °[facility] [address]  °[place], [place] [phone]   °[facility] - Intensive Outpatient [address], [place], [place] [phone]   °[facility] [address]  °[place], [place] 1-877-615-2722 or [phone]   °[facility] ([facility]) [address] [place], [place] [phone] Accepts Medicaid  °[facility] [address]  °[place] 1-800-659-3381 Substance Abuse/Addiction Treatment  ° °[place] Behavioral Health Resources °Organization         Address  Phone  Notes  °[facility]  (888) [phone]   °Julie Brannon, PhD [address] [place], [place]   (336) [phone] or (336) 951-0000   °[facility]   [address] °[place], [place] (336) [phone]   °[facility] [address], [place], [place] ([phone]) [phone] Insurance/Medicaid/sponsorship through [facility]  °[facility] [address]                                    [place], [place] ([phone]) [phone] Therapy/tele-psych/case  °[facility] [address]  ° [place], [place] (336) [phone]    °Dr. Arfeen  ([phone]) [phone]   °[facility] [place]  [employer] [place] [facility] 1) [address],  °2) [address], [place] °3)  [address], [place] (336) [phone] °(336) [phone] ° °(336) [phone]   °[place] Child Abuse Hotline (336) 342-1394 or (336) 342-3537 (After Hours)    ° ° ° ° °

## 2014-04-11 NOTE — ED Notes (Signed)
Patient requested to have a female nurse.

## 2014-04-11 NOTE — ED Notes (Signed)
Pt c/o left sided chest pain and numbness and tingling to left arm ongoing for a couple of weeks. Pt also reports pain in B/L breast area.

## 2014-04-13 LAB — POC URINE PREG, ED: PREG TEST UR: NEGATIVE

## 2014-08-15 ENCOUNTER — Encounter (HOSPITAL_COMMUNITY): Payer: Self-pay | Admitting: *Deleted

## 2014-08-15 DIAGNOSIS — E669 Obesity, unspecified: Secondary | ICD-10-CM | POA: Insufficient documentation

## 2014-08-15 DIAGNOSIS — H938X1 Other specified disorders of right ear: Secondary | ICD-10-CM | POA: Insufficient documentation

## 2014-08-15 DIAGNOSIS — R6884 Jaw pain: Secondary | ICD-10-CM | POA: Insufficient documentation

## 2014-08-15 DIAGNOSIS — K088 Other specified disorders of teeth and supporting structures: Secondary | ICD-10-CM | POA: Insufficient documentation

## 2014-08-15 DIAGNOSIS — R42 Dizziness and giddiness: Secondary | ICD-10-CM | POA: Insufficient documentation

## 2014-08-15 DIAGNOSIS — Z8619 Personal history of other infectious and parasitic diseases: Secondary | ICD-10-CM | POA: Insufficient documentation

## 2014-08-15 DIAGNOSIS — Z8742 Personal history of other diseases of the female genital tract: Secondary | ICD-10-CM | POA: Insufficient documentation

## 2014-08-15 DIAGNOSIS — H578 Other specified disorders of eye and adnexa: Secondary | ICD-10-CM | POA: Insufficient documentation

## 2014-08-15 DIAGNOSIS — H938X2 Other specified disorders of left ear: Secondary | ICD-10-CM | POA: Insufficient documentation

## 2014-08-15 LAB — CBC
HEMATOCRIT: 34.7 % — AB (ref 36.0–46.0)
HEMOGLOBIN: 11.6 g/dL — AB (ref 12.0–15.0)
MCH: 24.6 pg — ABNORMAL LOW (ref 26.0–34.0)
MCHC: 33.4 g/dL (ref 30.0–36.0)
MCV: 73.7 fL — AB (ref 78.0–100.0)
Platelets: 327 10*3/uL (ref 150–400)
RBC: 4.71 MIL/uL (ref 3.87–5.11)
RDW: 16.4 % — AB (ref 11.5–15.5)
WBC: 10.9 10*3/uL — AB (ref 4.0–10.5)

## 2014-08-15 NOTE — ED Notes (Signed)
PT is here with headache for a couple of weeks and then reports blurred vision for a couple of days.  Pt complains of ears popping.  Pt states her back gums are swollen.  Pt states she got dizzy and felt like she was going to pass out.  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  Pt has no other deficits.

## 2014-08-16 ENCOUNTER — Emergency Department (HOSPITAL_COMMUNITY)
Admission: EM | Admit: 2014-08-16 | Discharge: 2014-08-16 | Disposition: A | Discharge status: Home / Self Care | Payer: Self-pay | Attending: Emergency Medicine | Admitting: Emergency Medicine

## 2014-08-16 DIAGNOSIS — K0889 Other specified disorders of teeth and supporting structures: Secondary | ICD-10-CM

## 2014-08-16 LAB — BASIC METABOLIC PANEL
Anion gap: 8 (ref 5–15)
BUN: 10 mg/dL (ref 6–20)
CO2: 28 mmol/L (ref 22–32)
Calcium: 8.9 mg/dL (ref 8.9–10.3)
Chloride: 100 mmol/L — ABNORMAL LOW (ref 101–111)
Creatinine, Ser: 0.85 mg/dL (ref 0.44–1.00)
Glucose, Bld: 129 mg/dL — ABNORMAL HIGH (ref 65–99)
Potassium: 3.6 mmol/L (ref 3.5–5.1)
Sodium: 136 mmol/L (ref 135–145)

## 2014-08-16 MED ORDER — IBUPROFEN 800 MG PO TABS
800.0000 mg | ORAL_TABLET | Freq: Once | ORAL | Status: AC
Start: 2014-08-16 — End: 2014-08-16
  Administered 2014-08-16: 800 mg via ORAL
  Filled 2014-08-16: qty 1

## 2014-08-16 MED ORDER — HYDROCODONE-ACETAMINOPHEN 5-325 MG PO TABS
1.0000 | ORAL_TABLET | Freq: Once | ORAL | Status: AC
  Administered 2014-08-16: 1 via ORAL
  Filled 2014-08-16: qty 1

## 2014-08-16 MED ORDER — METOCLOPRAMIDE HCL 10 MG PO TABS
10.0000 mg | ORAL_TABLET | Freq: Once | ORAL | Status: AC
  Administered 2014-08-16: 10 mg via ORAL
  Filled 2014-08-16: qty 1

## 2014-08-16 MED ORDER — IBUPROFEN 800 MG PO TABS: 800.0000 mg | ORAL_TABLET | Freq: Three times a day (TID) | ORAL | Status: DC | PRN

## 2014-08-16 NOTE — ED Notes (Signed)
Pt A&OX4, ambulatory at d/c with steady gait, NAD 

## 2014-08-16 NOTE — ED Provider Notes (Addendum)
CSN: [id number]     Arrival date & time 08/15/14  2243 History  This chart was scribed for Regina Crumble, MD by Octavia Heir, ED Scribe. This patient was seen in room A12C/A12C and the patient's care was started at 1:08 AM.    Chief Complaint  Patient presents with  . Headache  . Eye Problem    blurred vision  . Dizziness      Patient is a 29 y.o. female presenting with eye problem and dizziness. The history is provided by the patient. No language interpreter was used.  Eye Problem Dizziness   HPI Comments: Regina Watkins is a 29 y.o. female who presents to the Emergency Department complaining of constant, gradual worsening headaches onset 2 days ago. Pt had associated blurry vision, jaw pain and states her ears "popping". She states that today at work her vision became blurry lasting about 2-3 minutes and she became dizzy and felt as if she was going to pass out, which prompted her to visit the ED tonight. Pt notes taking OTC ibuprofen to alleviate the pain with no relief. Pt also states that she has an upper right tooth coming in and suspects this could be the cause. Pt has not seen a dentist for her tooth pain. Pt denies runny nose, sneezing, weakness in arms/legs, difficulty eating/speaking and fevers.   Past Medical History  Diagnosis Date  . No pertinent past medical history   . Strep throat   . BV (bacterial vaginosis)   . Trichomonas   . Obesity   . Ectopic pregnancy   . Anemia    Past Surgical History  Procedure Laterality Date  . Salpingectomy     Family History  Problem Relation Age of Onset  . Heart disease Mother   . Diabetes Mother   . Diabetes Father   . Heart disease Father   . Anesthesia problems Neg Hx   . Hypotension Neg Hx   . Malignant hyperthermia Neg Hx   . Pseudochol deficiency Neg Hx    History  Substance Use Topics  . Smoking status: Never Smoker   . Smokeless tobacco: Never Used  . Alcohol Use: No   OB History    Gravida Para Term  Preterm AB TAB SAB Ectopic Multiple Living       Review of Systems  Neurological: Positive for dizziness.    A complete 10 system review of systems was obtained and all systems are negative except as noted in the HPI and PMH.    Allergies  Review of patient's allergies indicates no known allergies.  Home Medications   Prior to Admission medications   Medication Sig Start Date End Date Taking? Authorizing Provider  amoxicillin-clavulanate (AUGMENTIN) 875-125 MG per tablet Take 1 tablet by mouth 2 (two) times daily. Patient not taking: Reported on 04/11/2014 02/16/14   Ladona Mow, PA-C  ferrous sulfate 325 (65 FE) MG tablet Take 1 tablet (325 mg total) by mouth daily with breakfast. Patient not taking: Reported on 04/11/2014 12/02/13   Jacklyn Shell, CNM  ibuprofen (ADVIL,MOTRIN) 800 MG tablet Take 1 tablet (800 mg total) by mouth once. 02/16/14   Ladona Mow, PA-C  megestrol (MEGACE) 40 MG tablet Take 3/day (at the same time) for 5 days; 2/day for 5 days, then 1/day PO prn bleeding 11/29/13   Jacklyn Shell, CNM  Prenatal Vit-Fe Fumarate-FA (PRENATAL VITAMIN) 27-0.8 MG TABS Take 1 tablet by mouth daily. Patient not taking:  Reported on 04/11/2014 12/02/13   Jacklyn Shell, CNM   Triage vitals: BP 125/103 mmHg  Pulse 91  Temp(Src) 98.2 F (36.8 C)  Resp 20  Ht 5' 5.5" (1.664 m)  Wt 302 lb (136.986 kg)  BMI 49.47 kg/m2  SpO2 98%  LMP 08/12/2014 Physical Exam  Constitutional: She is oriented to person, place, and time. She appears well-developed and well-nourished. No distress.  HENT:  Head: Normocephalic and atraumatic.  Nose: Nose normal.  Mouth/Throat: Oropharynx is clear and moist. No oropharyngeal exudate.  Mild swelling to the gum line on the R side, do not see cavities in the teeth, no abscess formation  Normal TM bilaterally  Eyes: Conjunctivae and EOM are normal. Pupils are equal, round, and reactive to light. No scleral icterus.   Neck: Normal range of motion. Neck supple. No JVD present. No tracheal deviation present. No thyromegaly present.  Cardiovascular: Normal rate, regular rhythm and normal heart sounds.  Exam reveals no gallop and no friction rub.   No murmur heard. Pulmonary/Chest: Effort normal and breath sounds normal. No respiratory distress. She has no wheezes. She exhibits no tenderness.  Abdominal: Soft. Bowel sounds are normal. She exhibits no distension and no mass. There is no tenderness. There is no rebound and no guarding.  Musculoskeletal: Normal range of motion. She exhibits no edema or tenderness.  Lymphadenopathy:    She has no cervical adenopathy.  Neurological: She is alert and oriented to person, place, and time. No cranial nerve deficit. She exhibits normal muscle tone.  Skin: Skin is warm and dry. No rash noted. No erythema. No pallor.  Nursing note and vitals reviewed.   ED Course  Procedures  DIAGNOSTIC STUDIES: Oxygen Saturation is 98% on RA, normal by my interpretation.  COORDINATION OF CARE:  1:14 AM Discussed treatment plan which includes follow up with a dentist, pain medication with pt at bedside and pt agreed to plan.   Labs Review Labs Reviewed  CBC - Abnormal; Notable for the following:    WBC 10.9 (*)    Hemoglobin 11.6 (*)    HCT 34.7 (*)    MCV 73.7 (*)    MCH 24.6 (*)    RDW 16.4 (*)    All other components within normal limits  BASIC METABOLIC PANEL - Abnormal; Notable for the following:    Chloride 100 (*)    Glucose, Bld 129 (*)    All other components within normal limits  POC URINE PREG, ED    Imaging Review No results found.   EKG Interpretation None      MDM   Final diagnoses:  None   Patient presents emergency department for dental pain at tooth #1.  She feels like her wisdom teeth may be coming in.  She has associated headache, and dizziness.  She has been taking motrin without significant releif.  She has not seen a dentist.  She was  given dental follow up. Pain treated with reglan, motrin and norco in the ED.  Will DC with motrin course.  No abx warranted at this time.  She otherwise appear well and in NAD.  Her VS remain within her normal limits and she is safe forDc.   I personally performed the services described in this documentation, which was scribed in my presence. The recorded information has been reviewed and is accurate.   Regina Crumble, MD 08/16/14 [id number]  Regina Crumble, MD 08/16/14 [id number]

## 2014-08-16 NOTE — Discharge Instructions (Signed)
Dental Pain Ms. Stukel, your pain is likely coming from your teeth.  Take motrin as prescribed for pain and see a dentist within 3 days for close follow up.  You can call the attached phone numbers for an appointment.  If symptoms worsen, come back to the ED immediately. Thank you. Toothache is pain in or around a tooth. It may get worse with chewing or with cold or heat.  HOME CARE  Your dentist may use a numbing medicine during treatment. If so, you may need to avoid eating until the medicine wears off. Ask your dentist about this.  Only take medicine as told by your dentist or doctor.  Avoid chewing food near the painful tooth until after all treatment is done. Ask your dentist about this. GET HELP RIGHT AWAY IF:   The problem gets worse or new problems appear.  You have a fever.  There is redness and puffiness (swelling) of the face, jaw, or neck.  You cannot open your mouth.  There is pain in the jaw.  There is very bad pain that is not helped by medicine. MAKE SURE YOU:   Understand these instructions.  Will watch your condition.  Will get help right away if you are not doing well or get worse. Document Released: 08/07/2007 Document Revised: 05/13/2011 Document Reviewed: 08/07/2007 [facility] Patient Information 2015 [place], [place]. This information is not intended to replace advice given to you by your health care provider. Make sure you discuss any questions you have with your health care provider.

## 2014-08-21 ENCOUNTER — Inpatient Hospital Stay (HOSPITAL_COMMUNITY)
Admission: AD | Admit: 2014-08-21 | Discharge: 2014-08-21 | Disposition: A | Discharge status: Home / Self Care | Payer: Self-pay | Source: Ambulatory Visit | Attending: Obstetrics & Gynecology | Admitting: Obstetrics & Gynecology

## 2014-08-21 ENCOUNTER — Encounter (HOSPITAL_COMMUNITY): Payer: Self-pay | Admitting: *Deleted

## 2014-08-21 DIAGNOSIS — B9689 Other specified bacterial agents as the cause of diseases classified elsewhere: Secondary | ICD-10-CM

## 2014-08-21 DIAGNOSIS — A499 Bacterial infection, unspecified: Secondary | ICD-10-CM

## 2014-08-21 DIAGNOSIS — N898 Other specified noninflammatory disorders of vagina: Secondary | ICD-10-CM | POA: Insufficient documentation

## 2014-08-21 DIAGNOSIS — N760 Acute vaginitis: Secondary | ICD-10-CM | POA: Insufficient documentation

## 2014-08-21 LAB — URINALYSIS, ROUTINE W REFLEX MICROSCOPIC
Bilirubin Urine: NEGATIVE
Glucose, UA: NEGATIVE mg/dL
KETONES UR: NEGATIVE mg/dL
Leukocytes, UA: NEGATIVE
Nitrite: NEGATIVE
Protein, ur: NEGATIVE mg/dL
SPECIFIC GRAVITY, URINE: 1.025 (ref 1.005–1.030)
Urobilinogen, UA: 1 mg/dL (ref 0.0–1.0)
pH: 6 (ref 5.0–8.0)

## 2014-08-21 LAB — WET PREP, GENITAL
Trich, Wet Prep: NONE SEEN
WBC WET PREP: NONE SEEN
YEAST WET PREP: NONE SEEN

## 2014-08-21 LAB — POCT PREGNANCY, URINE: PREG TEST UR: NEGATIVE

## 2014-08-21 LAB — URINE MICROSCOPIC-ADD ON

## 2014-08-21 MED ORDER — METRONIDAZOLE 500 MG PO TABS: 500.0000 mg | ORAL_TABLET | Freq: Two times a day (BID) | ORAL | Status: DC

## 2014-08-21 NOTE — MAU Note (Signed)
Patient presents to MAU with c/o vaginal irritation; denies any abnormal discharge at this time. Patient states was staying with relatives and used a new soap and has recently been using condoms as well. Unsure which may have caused the irritation; states this has happened before.

## 2014-08-21 NOTE — Discharge Instructions (Signed)
Bacterial Vaginosis Bacterial vaginosis is a vaginal infection that occurs when the normal balance of bacteria in the vagina is disrupted. It results from an overgrowth of certain bacteria. This is the most common vaginal infection in women of childbearing age. Treatment is important to prevent complications, especially in pregnant women, as it can cause a premature delivery. CAUSES  Bacterial vaginosis is caused by an increase in harmful bacteria that are normally present in smaller amounts in the vagina. Several different kinds of bacteria can cause bacterial vaginosis. However, the reason that the condition develops is not fully understood. RISK FACTORS Certain activities or behaviors can put you at an increased risk of developing bacterial vaginosis, including:  Having a new sex partner or multiple sex partners.  Douching.  Using an intrauterine device (IUD) for contraception. Women do not get bacterial vaginosis from toilet seats, bedding, swimming pools, or contact with objects around them. SIGNS AND SYMPTOMS  Some women with bacterial vaginosis have no signs or symptoms. Common symptoms include:  Grey vaginal discharge.  A fishlike odor with discharge, especially after sexual intercourse.  Itching or burning of the vagina and vulva.  Burning or pain with urination. DIAGNOSIS  Your health care provider will take a medical history and examine the vagina for signs of bacterial vaginosis. A sample of vaginal fluid may be taken. Your health care provider will look at this sample under a microscope to check for bacteria and abnormal cells. A vaginal pH test may also be done.  TREATMENT  Bacterial vaginosis may be treated with antibiotic medicines. These may be given in the form of a pill or a vaginal cream. A second round of antibiotics may be prescribed if the condition comes back after treatment.  HOME CARE INSTRUCTIONS   Only take over-the-counter or prescription medicines as  directed by your health care provider.  If antibiotic medicine was prescribed, take it as directed. Make sure you finish it even if you start to feel better.  Do not have sex until treatment is completed.  Tell all sexual partners that you have a vaginal infection. They should see their health care provider and be treated if they have problems, such as a mild rash or itching.  Practice safe sex by using condoms and only having one sex partner. SEEK MEDICAL CARE IF:   Your symptoms are not improving after 3 days of treatment.  You have increased discharge or pain.  You have a fever. MAKE SURE YOU:   Understand these instructions.  Will watch your condition.  Will get help right away if you are not doing well or get worse. FOR MORE INFORMATION  Centers for Disease Control and Prevention, Division of STD Prevention: www.cdc.gov/std American Sexual Health Association (ASHA): www.ashastd.org  Document Released: 02/18/2005 Document Revised: 12/09/2012 Document Reviewed: 09/30/2012 [employer] Patient Information 2015 [employer]. This information is not intended to replace advice given to you by your health care provider. Make sure you discuss any questions you have with your health care provider.  

## 2014-08-21 NOTE — MAU Provider Note (Signed)
CSN: [id number]     Arrival date & time 08/21/14  1941 History   None    Chief Complaint  Patient presents with  . vaginal irritation      (Consider location/radiation/quality/duration/timing/severity/associated sxs/prior Treatment) Patient is a 29 y.o. female presenting with vaginal itching. The history is provided by the patient.  Vaginal Itching The current episode started in the past 7 days. The problem occurs constantly. The problem has been gradually worsening. The symptoms are aggravated by intercourse. She has tried nothing for the symptoms.   Regina Watkins is a 29 y.o. G1P0010 who presents to the MAU with vaginal irritation. Patient reports that a few days ago she stayed with relatives and used a different soap and that she has also been using condoms recently. She thinks that one or the other may have caused the irritation and itching and burning.   Past Medical History  Diagnosis Date  . No pertinent past medical history   . Strep throat   . BV (bacterial vaginosis)   . Trichomonas   . Obesity   . Ectopic pregnancy   . Anemia    Past Surgical History  Procedure Laterality Date  . Salpingectomy     Family History  Problem Relation Age of Onset  . Heart disease Mother   . Diabetes Mother   . Diabetes Father   . Heart disease Father   . Anesthesia problems Neg Hx   . Hypotension Neg Hx   . Malignant hyperthermia Neg Hx   . Pseudochol deficiency Neg Hx    History  Substance Use Topics  . Smoking status: Never Smoker   . Smokeless tobacco: Never Used  . Alcohol Use: No   OB History    Gravida Para Term Preterm AB TAB SAB Ectopic Multiple Living   1 0 0 0 1 0 0 1 0 0      Review of Systems Negative except as stated in HPI   Allergies  Review of patient's allergies indicates no known allergies.  Home Medications   Prior to Admission medications   Medication Sig Start Date End Date Taking? Authorizing Provider  ibuprofen (ADVIL,MOTRIN) 800 MG  tablet Take 1 tablet (800 mg total) by mouth every 8 (eight) hours as needed for moderate pain. 08/16/14  Yes [name], MD  megestrol (MEGACE) 40 MG tablet Take 3/day (at the same time) for 5 days; 2/day for 5 days, then 1/day PO prn bleeding Patient taking differently: Take 120 mg by mouth daily as needed (for bleeding). Take 3/day (at the same time) for 5 days; 2/day for 5 days, then 1/day PO prn bleeding 11/29/13  Yes Jacklyn Shell, CNM  ferrous sulfate 325 (65 FE) MG tablet Take 1 tablet (325 mg total) by mouth daily with breakfast. Patient not taking: Reported on 04/11/2014 12/02/13   Jacklyn Shell, CNM  metroNIDAZOLE (FLAGYL) 500 MG tablet Take 1 tablet (500 mg total) by mouth 2 (two) times daily. 08/21/14   Hope Orlene Och, NP  Prenatal Vit-Fe Fumarate-FA (PRENATAL VITAMIN) 27-0.8 MG TABS Take 1 tablet by mouth daily. Patient not taking: Reported on 04/11/2014 12/02/13   Jacklyn Shell, CNM   BP 120/67 mmHg  Pulse 102  Temp(Src) 97.9 F (36.6 C) (Oral)  Resp 18  Ht 5' 5.5" (1.664 m)  Wt 302 lb (136.986 kg)  BMI 49.47 kg/m2  SpO2 99%  LMP 08/12/2014 Physical Exam  Constitutional: She is oriented to person, place, and time. No distress.  obese  HENT:  Head:  Normocephalic.  Eyes: EOM are normal.  Neck: Neck supple.  Cardiovascular: Tachycardia present.   Pulmonary/Chest: Effort normal.  Abdominal: Soft. There is no tenderness.  Genitourinary:  External genitalia with irritation and lesion noted at the introitus. Tender on exam. Lesion also noted on the mucous membrane of the right labia major.  White malodorous d/c vaginal vault. No CMT, no adnexal tenderness. Unable to palpate uterus due to patient habitus.   Musculoskeletal: Normal range of motion.  Neurological: She is alert and oriented to person, place, and time. No cranial nerve deficit.  Skin: Skin is warm and dry.  Psychiatric: She has a normal mood and affect. Her behavior is normal.  Nursing  note and vitals reviewed.   ED Course  Procedures (including critical care time) Labs Review Results for orders placed or performed during the hospital encounter of 08/21/14 (from the past 24 hour(s))  Urinalysis, Routine w reflex microscopic (not at [facility])     Status: Abnormal   Collection Time: 08/21/14  7:49 PM  Result Value Ref Range   Color, Urine YELLOW YELLOW   APPearance CLEAR CLEAR   Specific Gravity, Urine 1.025 1.005 - 1.030   pH 6.0 5.0 - 8.0   Glucose, UA NEGATIVE NEGATIVE mg/dL   Hgb urine dipstick TRACE (A) NEGATIVE   Bilirubin Urine NEGATIVE NEGATIVE   Ketones, ur NEGATIVE NEGATIVE mg/dL   Protein, ur NEGATIVE NEGATIVE mg/dL   Urobilinogen, UA 1.0 0.0 - 1.0 mg/dL   Nitrite NEGATIVE NEGATIVE   Leukocytes, UA NEGATIVE NEGATIVE  Urine microscopic-add on     Status: None   Collection Time: 08/21/14  7:49 PM  Result Value Ref Range   Squamous Epithelial / LPF RARE RARE   WBC, UA 0-2 <3 WBC/hpf   Bacteria, UA RARE RARE  Pregnancy, urine POC     Status: None   Collection Time: 08/21/14  7:57 PM  Result Value Ref Range   Preg Test, Ur NEGATIVE NEGATIVE  Wet prep, genital     Status: Abnormal   Collection Time: 08/21/14  8:20 PM  Result Value Ref Range   Yeast Wet Prep HPF POC NONE SEEN NONE SEEN   Trich, Wet Prep NONE SEEN NONE SEEN   Clue Cells Wet Prep HPF POC MODERATE (A) NONE SEEN   WBC, Wet Prep HPF POC NONE SEEN NONE SEEN     MDM  29 y.o. female with vaginal irritation and d/c with itching. Will treat for BV and await culture results for GC, HSV and Chlamydia. Discussed with the patient clinical and lab findings and plan of care and all questioned fully answered. She will return if any problems arise. Stable for d/c without urinary retention.  Final diagnoses:  Bacterial vaginosis  Vaginal irritation

## 2014-08-22 LAB — GC/CHLAMYDIA PROBE AMP (~~LOC~~) NOT AT ARMC
Chlamydia: NEGATIVE
NEISSERIA GONORRHEA: NEGATIVE

## 2014-08-24 LAB — HERPES SIMPLEX VIRUS CULTURE: CULTURE: NOT DETECTED

## 2014-08-29 ENCOUNTER — Telehealth: Payer: Self-pay

## 2014-08-29 NOTE — Telephone Encounter (Signed)
Called Pt in regards to her test results. Informed pt that all her test results were negative except for the Wet Prep in which she had already recieved antibiotics for and completed the course of the medications pt stated she was still having symptoms discussed with nurses and they stated to give the antibiotics a little more time to work but if no change to call back to the clinic . Asked patient if she had anymore questions patient states she has not other questions. Pt verbalizes understanding.

## 2014-11-01 ENCOUNTER — Inpatient Hospital Stay (HOSPITAL_COMMUNITY)
Admission: AD | Admit: 2014-11-01 | Discharge: 2014-11-01 | Disposition: A | Discharge status: Home / Self Care | Payer: Self-pay | Source: Ambulatory Visit | Attending: Obstetrics & Gynecology | Admitting: Obstetrics & Gynecology

## 2014-11-01 ENCOUNTER — Encounter (HOSPITAL_COMMUNITY): Payer: Self-pay | Admitting: *Deleted

## 2014-11-01 DIAGNOSIS — N939 Abnormal uterine and vaginal bleeding, unspecified: Secondary | ICD-10-CM

## 2014-11-01 DIAGNOSIS — O9089 Other complications of the puerperium, not elsewhere classified: Secondary | ICD-10-CM | POA: Insufficient documentation

## 2014-11-01 HISTORY — DX: Unspecified infectious disease: B99.9

## 2014-11-01 LAB — CBC
HCT: 31 % — ABNORMAL LOW (ref 36.0–46.0)
Hemoglobin: 10.3 g/dL — ABNORMAL LOW (ref 12.0–15.0)
MCH: 25.2 pg — ABNORMAL LOW (ref 26.0–34.0)
MCHC: 33.2 g/dL (ref 30.0–36.0)
MCV: 76 fL — ABNORMAL LOW (ref 78.0–100.0)
Platelets: 357 10*3/uL (ref 150–400)
RBC: 4.08 MIL/uL (ref 3.87–5.11)
RDW: 14.6 % (ref 11.5–15.5)
WBC: 11.9 10*3/uL — ABNORMAL HIGH (ref 4.0–10.5)

## 2014-11-01 LAB — URINALYSIS, ROUTINE W REFLEX MICROSCOPIC
Bilirubin Urine: NEGATIVE
Glucose, UA: NEGATIVE mg/dL
Ketones, ur: 15 mg/dL — AB
LEUKOCYTES UA: NEGATIVE
Nitrite: NEGATIVE
Protein, ur: NEGATIVE mg/dL
SPECIFIC GRAVITY, URINE: 1.025 (ref 1.005–1.030)
Urobilinogen, UA: 2 mg/dL — ABNORMAL HIGH (ref 0.0–1.0)
pH: 6 (ref 5.0–8.0)

## 2014-11-01 LAB — URINE MICROSCOPIC-ADD ON

## 2014-11-01 LAB — POCT PREGNANCY, URINE: PREG TEST UR: NEGATIVE

## 2014-11-01 MED ORDER — MEGESTROL ACETATE 20 MG PO TABS: 80.0000 mg | ORAL_TABLET | Freq: Two times a day (BID) | ORAL | Status: DC

## 2014-11-01 MED ORDER — MEGESTROL ACETATE 40 MG PO TABS: ORAL_TABLET | ORAL | Status: DC

## 2014-11-01 NOTE — MAU Note (Addendum)
Been bleeding for 3 wks  (8/11- just when wiped until the 15th- that is when she came on ).  Last night, started cramping really bad, was nauseated and threw up. Dizziness started at the beginning of the week..  Thinks it is because of blood loss

## 2014-11-01 NOTE — MAU Provider Note (Signed)
History     CSN: [id number]  Arrival date and time: 11/01/14 1814   First Provider Initiated Contact with Patient 11/01/14 1912      Chief Complaint  Patient presents with  . Vaginal Bleeding  . Dizziness   HPI  Regina Watkins 29 y.o. G1P0010 presents with complaint of having a period for 3 weeks. She states she has passed some clots but not today. She has taken some birth control pills out of a pack but does not know how many.  Past Medical History  Diagnosis Date  . No pertinent past medical history   . Strep throat   . BV (bacterial vaginosis)   . Trichomonas   . Obesity   . Ectopic pregnancy   . Anemia   . Infection     UTI    Past Surgical History  Procedure Laterality Date  . Salpingectomy      removal of ectopic preg and tube    Family History  Problem Relation Age of Onset  . Hypertension Mother   . Heart disease Father     CHF  . Anesthesia problems Neg Hx   . Hypotension Neg Hx   . Malignant hyperthermia Neg Hx   . Pseudochol deficiency Neg Hx   . Diabetes Maternal Grandmother   . Hypertension Maternal Grandmother   . Cancer Maternal Grandmother     breast  . Cancer Paternal Grandmother     Social History  Substance Use Topics  . Smoking status: Never Smoker   . Smokeless tobacco: Never Used  . Alcohol Use: No    Allergies: No Known Allergies  Prescriptions prior to admission  Medication Sig Dispense Refill Last Dose  . ibuprofen (ADVIL,MOTRIN) 800 MG tablet Take 1 tablet (800 mg total) by mouth every 8 (eight) hours as needed for moderate pain. 15 tablet 0 10/31/2014 at Unknown time  . norgestimate-ethinyl estradiol (ORTHO-CYCLEN,SPRINTEC,PREVIFEM) 0.25-35 MG-MCG tablet Take 1 tablet by mouth daily.   11/01/2014 at Unknown time  . ferrous sulfate 325 (65 FE) MG tablet Take 1 tablet (325 mg total) by mouth daily with breakfast. (Patient not taking: Reported on 04/11/2014) 30 tablet 11 Not Taking at Unknown time  . megestrol (MEGACE) 40 MG  tablet Take 3/day (at the same time) for 5 days; 2/day for 5 days, then 1/day PO prn bleeding (Patient not taking: Reported on 11/01/2014) 60 tablet 3 Not Taking at Unknown time  . metroNIDAZOLE (FLAGYL) 500 MG tablet Take 1 tablet (500 mg total) by mouth 2 (two) times daily. (Patient not taking: Reported on 11/01/2014) 14 tablet 0 Completed Course at Unknown time  . Prenatal Vit-Fe Fumarate-FA (PRENATAL VITAMIN) 27-0.8 MG TABS Take 1 tablet by mouth daily. (Patient not taking: Reported on 04/11/2014) 30 tablet 11 Not Taking at Unknown time    Review of Systems  Constitutional: Negative for fever.  Gastrointestinal: Positive for abdominal pain.  Genitourinary:       Vaginal bleeding  All other systems reviewed and are negative.  Physical Exam   Blood pressure 129/79, pulse 95, temperature 98.6 F (37 C), temperature source Oral, resp. rate 18, height 5\' 4"  (1.626 m), weight 137.168 kg (302 lb 6.4 oz), last menstrual period 10/13/2014.  Physical Exam  Nursing note and vitals reviewed. Constitutional: She is oriented to person, place, and time. She appears well-developed and well-nourished. No distress.  HENT:  Head: Normocephalic.  Neck: Normal range of motion.  Cardiovascular: Normal rate.   Respiratory: Effort normal.  GI: Soft. She  exhibits no distension.  Musculoskeletal: Normal range of motion.  Neurological: She is alert and oriented to person, place, and time.  Skin: Skin is warm and dry.  Psychiatric: She has a normal mood and affect. Her behavior is normal. Judgment and thought content normal.    MAU Course  Procedures  MDM CBC to check for Anemia; Advised Birth control pills for pt to control abnormal bleeding. Pt left AMA  Assessment and Plan  Anemia Abnormal Uterine Bleeding    Clemmons,Lori Grissett 11/01/2014, 7:26 PM

## 2014-11-01 NOTE — Discharge Instructions (Signed)
Abnormal Uterine Bleeding Abnormal uterine bleeding can affect women at various stages in life, including teenagers, women in their reproductive years, pregnant women, and women who have reached menopause. Several kinds of uterine bleeding are considered abnormal, including:  Bleeding or spotting between periods.   Bleeding after sexual intercourse.   Bleeding that is heavier or more than normal.   Periods that last longer than usual.  Bleeding after menopause.  Many cases of abnormal uterine bleeding are minor and simple to treat, while others are more serious. Any type of abnormal bleeding should be evaluated by your health care provider. Treatment will depend on the cause of the bleeding. HOME CARE INSTRUCTIONS Monitor your condition for any changes. The following actions may help to alleviate any discomfort you are experiencing:  Avoid the use of tampons and douches as directed by your health care provider.  Change your pads frequently. You should get regular pelvic exams and Pap tests. Keep all follow-up appointments for diagnostic tests as directed by your health care provider.  SEEK MEDICAL CARE IF:   Your bleeding lasts more than 1 week.   You feel dizzy at times.  SEEK IMMEDIATE MEDICAL CARE IF:   You pass out.   You are changing pads every 15 to 30 minutes.   You have abdominal pain.  You have a fever.   You become sweaty or weak.   You are passing large blood clots from the vagina.   You start to feel nauseous and vomit. MAKE SURE YOU:   Understand these instructions.  Will watch your condition.  Will get help right away if you are not doing well or get worse. Document Released: 02/18/2005 Document Revised: 02/23/2013 Document Reviewed: 09/17/2012 [employer] Patient Information 2015 [employer]. This information is not intended to replace advice given to you by your health care provider. Make sure you discuss any questions you have with your  health care provider.  

## 2014-11-01 NOTE — MAU Provider Note (Signed)
Pt reports concerns about discharge from hospital and is worried about loss of blood.  RN called CNM to room to discuss with pt.  Reviewed labs, including CBC.  Pt stable at this time.  F/U appointment made for this week in Gyn clinic.  Pt to continue discharge with Megace PO as planned.  Return to MAU as needed for emergencies.

## 2014-11-03 ENCOUNTER — Ambulatory Visit: Payer: Self-pay | Admitting: Obstetrics & Gynecology

## 2014-11-04 IMAGING — CR DG FOOT COMPLETE 3+V*R*
3 series · 3 of 3 positions shown · non-contrast
Comparison: 11/27/2009

CLINICAL DATA: Right foot pain, no known injury

EXAM:
RIGHT FOOT COMPLETE - 3+ VIEW

[t foot ap right]
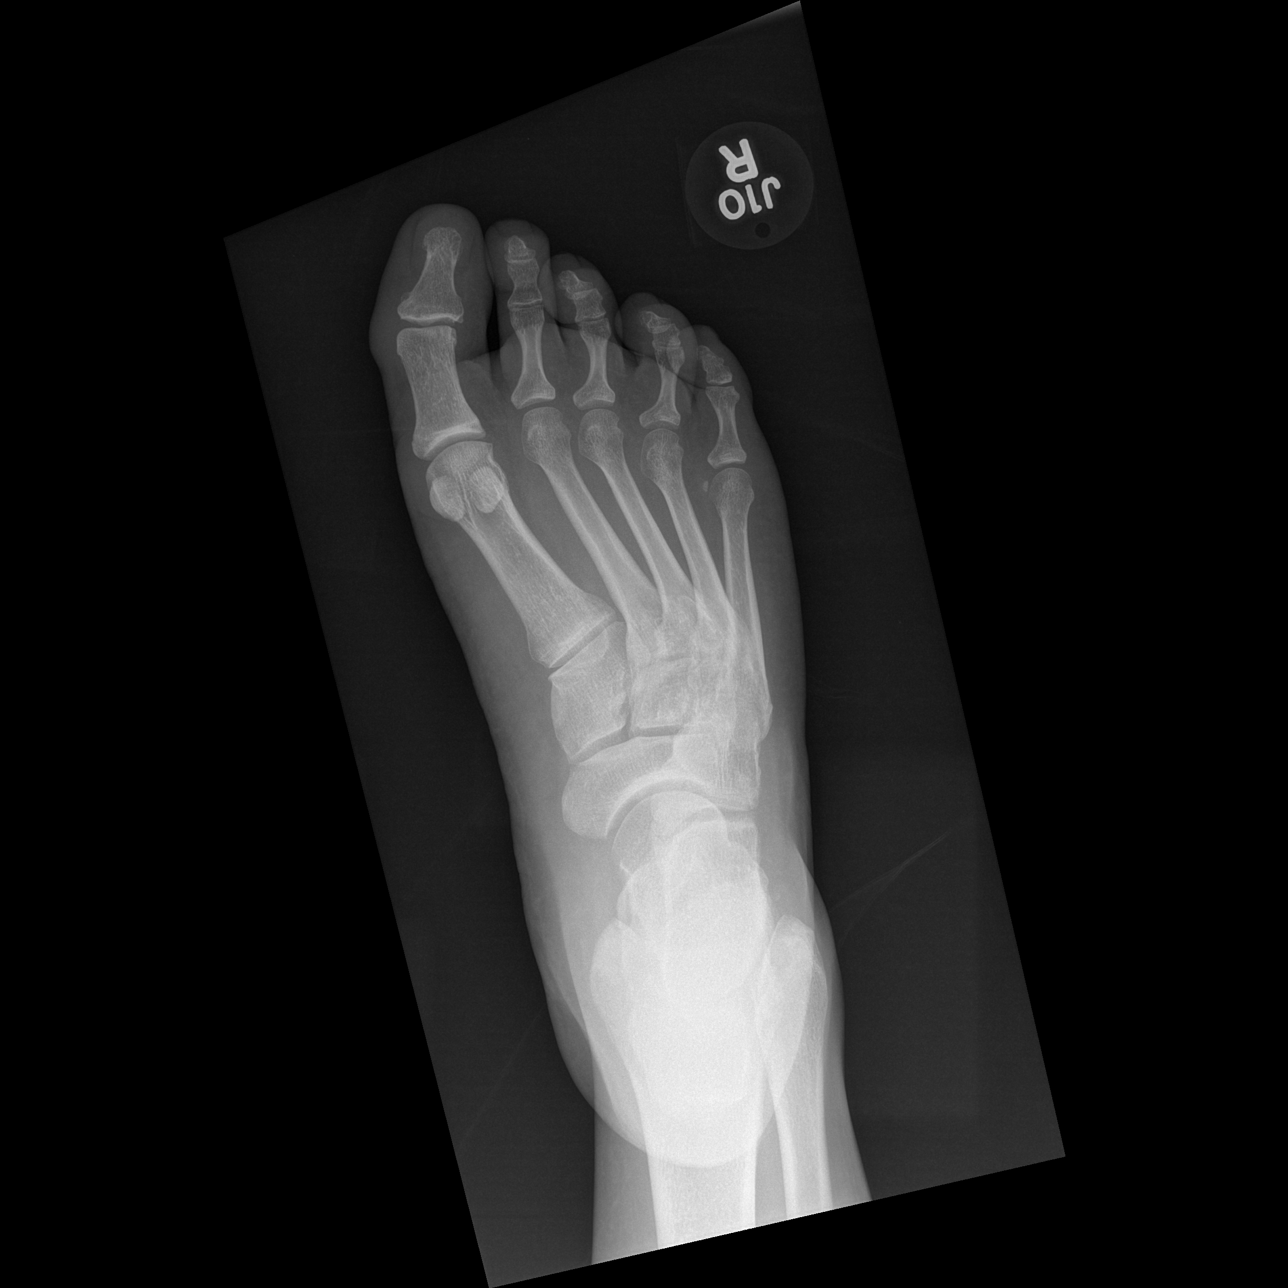

[t foot oblique right]
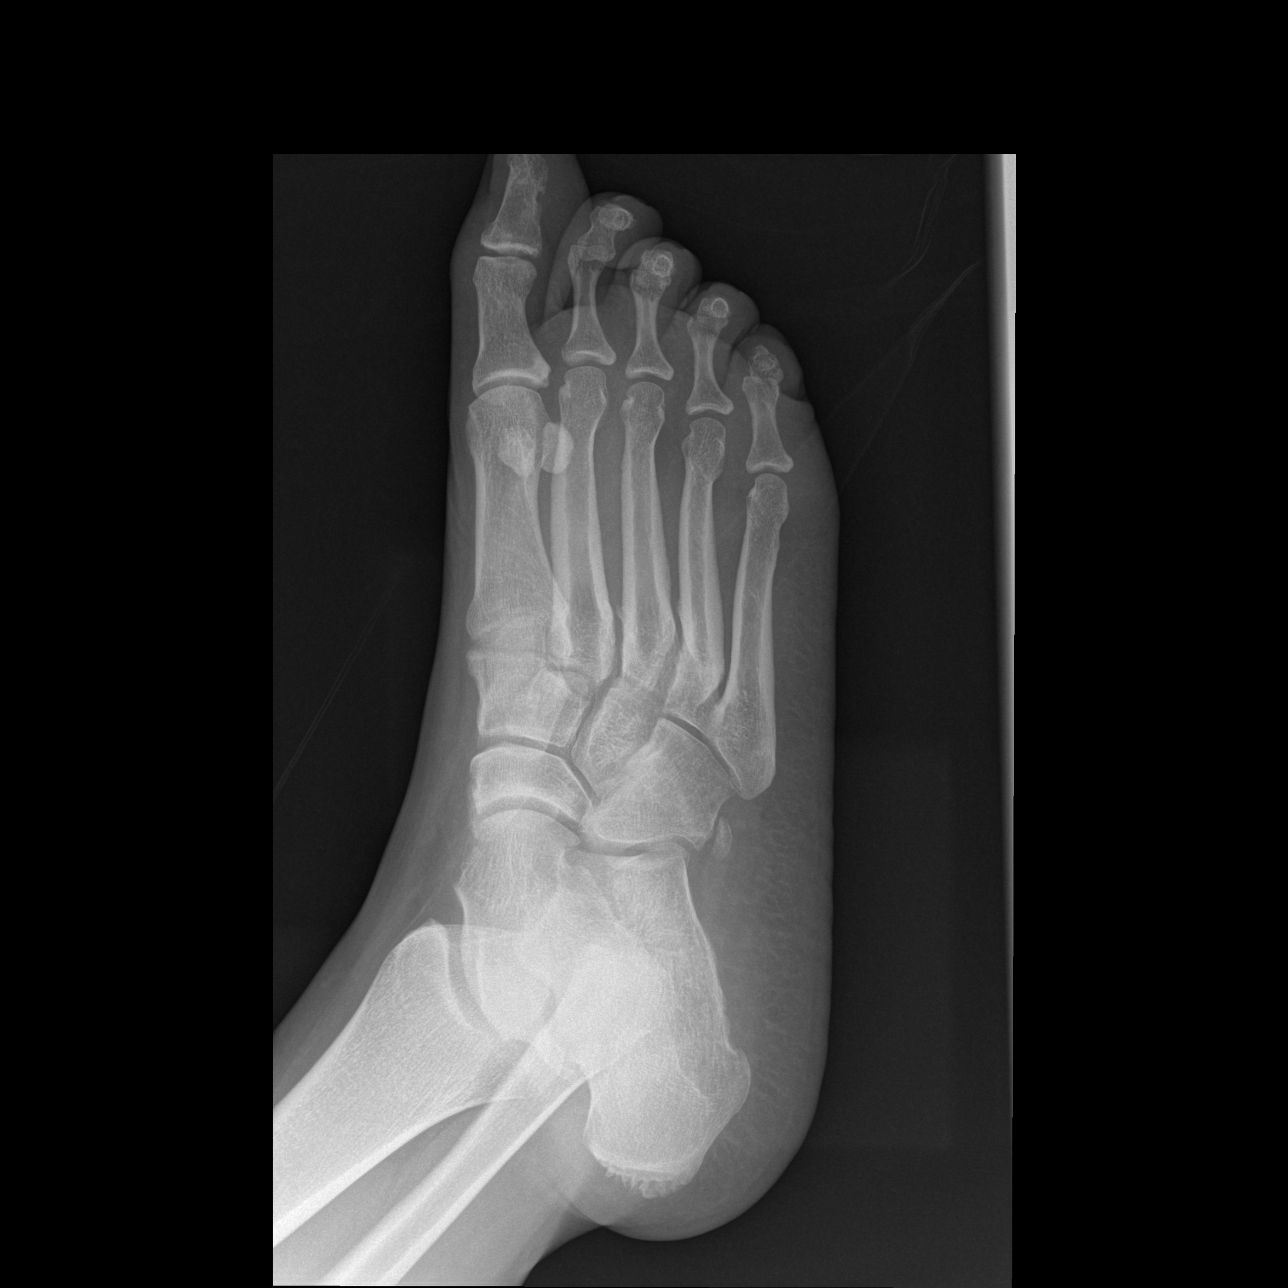

[t foot lat right]
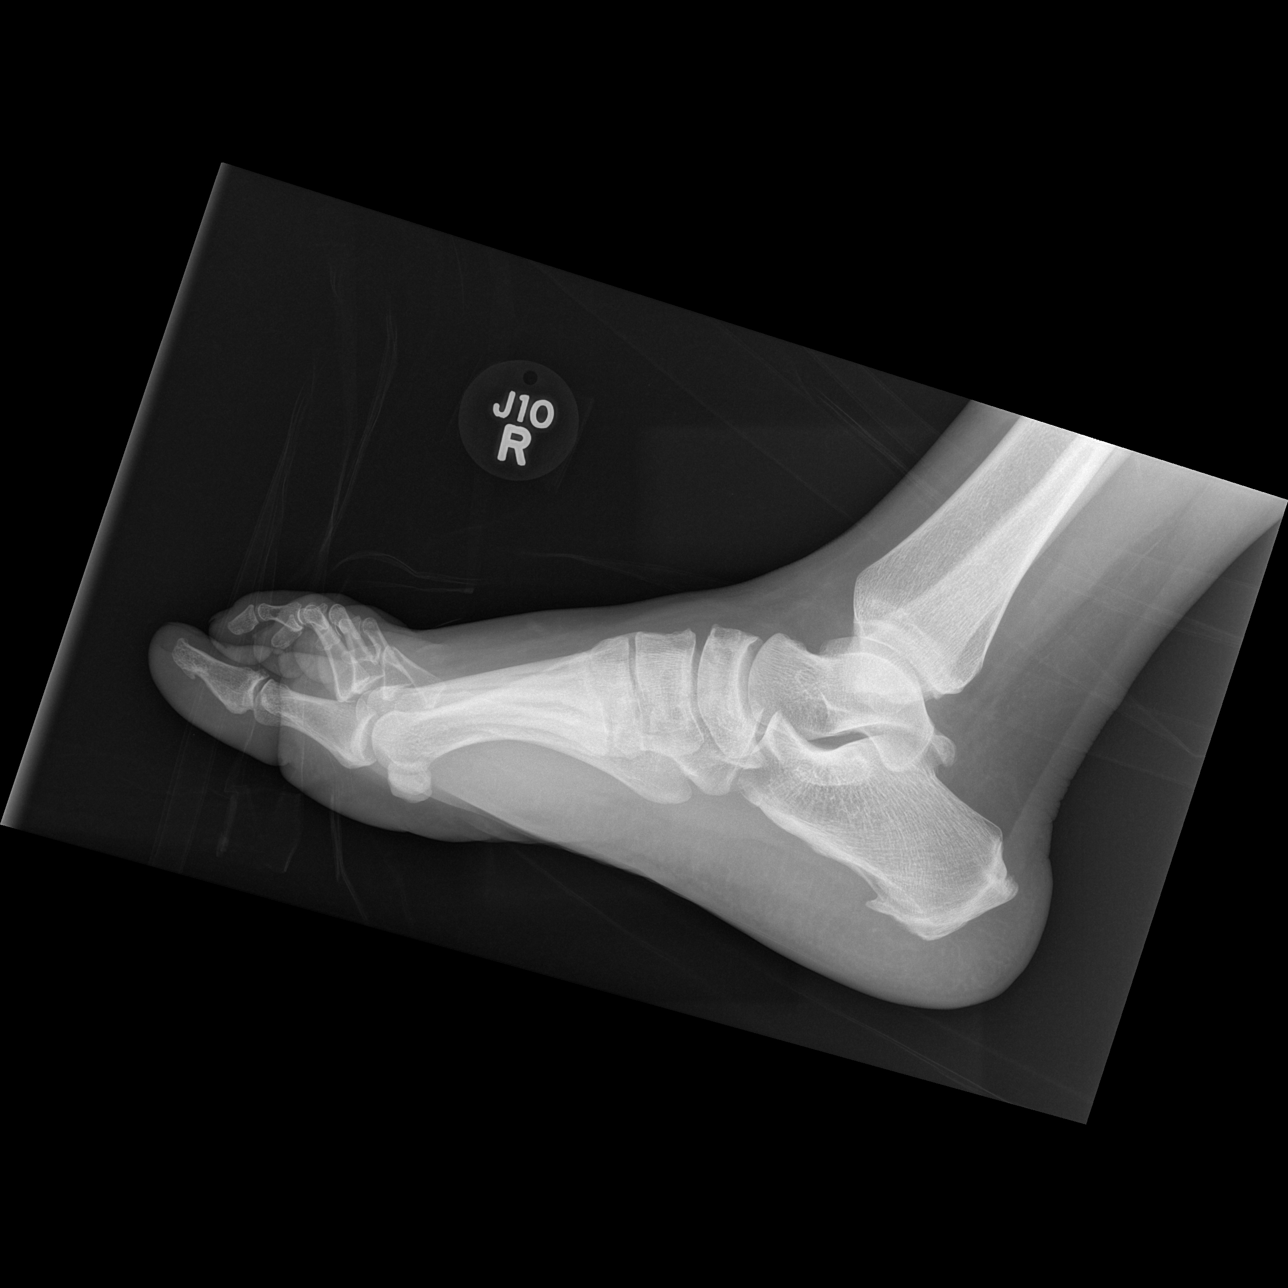

[3 of 3 positions shown; findings below may reference images not displayed]

FINDINGS: No fracture or dislocation is seen.

Mild degenerative changes of the 1st MTP and IP joints.

Mild degenerative changes of the dorsal midfoot.

Small plantar and posterior calcaneal enthesophytes.

The visualized soft tissues are unremarkable.
IMPRESSION: No fracture or dislocation is seen.

Mild degenerative changes, as above.

## 2014-11-21 ENCOUNTER — Ambulatory Visit: Payer: Self-pay | Admitting: Obstetrics & Gynecology

## 2014-11-25 ENCOUNTER — Ambulatory Visit (INDEPENDENT_AMBULATORY_CARE_PROVIDER_SITE_OTHER): Payer: Self-pay | Admitting: Obstetrics & Gynecology

## 2014-11-25 ENCOUNTER — Encounter: Payer: Self-pay | Admitting: Obstetrics & Gynecology

## 2014-11-25 VITALS — BP 152/83 | HR 86 | Ht 65.0 in | Wt 302.7 lb

## 2014-11-25 DIAGNOSIS — N939 Abnormal uterine and vaginal bleeding, unspecified: Secondary | ICD-10-CM

## 2014-11-25 MED ORDER — NORETHIN ACE-ETH ESTRAD-FE 1-20 MG-MCG PO TABS: 1.0000 | ORAL_TABLET | Freq: Every day | ORAL | Status: DC

## 2014-11-25 NOTE — Patient Instructions (Signed)
Polycystic Ovarian Syndrome  Polycystic ovarian syndrome (PCOS) is a common hormonal disorder among women of reproductive age. Most women with PCOS grow many small cysts on their ovaries. PCOS can cause problems with your periods and make it difficult to get pregnant. It can also cause an increased risk of miscarriage with pregnancy. If left untreated, PCOS can lead to serious health problems, such as diabetes and heart disease.  CAUSES  The cause of PCOS is not fully understood, but genetics may be a factor.  SIGNS AND SYMPTOMS    Infrequent or no menstrual periods.    Inability to get pregnant (infertility) because of not ovulating.    Increased growth of hair on the face, chest, stomach, back, thumbs, thighs, or toes.    Acne, oily skin, or dandruff.    Pelvic pain.    Weight gain or obesity, usually carrying extra weight around the waist.    Type 2 diabetes.    High cholesterol.    High blood pressure.    Female-pattern baldness or thinning hair.    Patches of thickened and dark brown or black skin on the neck, arms, breasts, or thighs.    Tiny excess flaps of skin (skin tags) in the armpits or neck area.    Excessive snoring and having breathing stop at times while asleep (sleep apnea).    Deepening of the voice.    Gestational diabetes when pregnant.   DIAGNOSIS   There is no single test to diagnose PCOS.    Your health care provider will:    Take a medical history.    Perform a pelvic exam.    Have ultrasonography done.    Check your female and female hormone levels.    Measure glucose or sugar levels in the blood.    Do other blood tests.    If you are producing too many female hormones, your health care provider will make sure it is from PCOS. At the physical exam, your health care provider will want to evaluate the areas of increased hair growth. Try to allow natural hair growth for a few days before the visit.    During a pelvic exam, the ovaries may be  enlarged or swollen because of the increased number of small cysts. This can be seen more easily by using vaginal ultrasonography or screening to examine the ovaries and lining of the uterus (endometrium) for cysts. The uterine lining may become thicker if you have not been having a regular period.   TREATMENT   Because there is no cure for PCOS, it needs to be managed to prevent problems. Treatments are based on your symptoms. Treatment is also based on whether you want to have a baby or whether you need contraception.   Treatment may include:    Progesterone hormone to start a menstrual period.    Birth control pills to make you have regular menstrual periods.    Medicines to make you ovulate, if you want to get pregnant.    Medicines to control your insulin.    Medicine to control your blood pressure.    Medicine and diet to control your high cholesterol and triglycerides in your blood.   Medicine to reduce excessive hair growth.   Surgery, making small holes in the ovary, to decrease the amount of female hormone production. This is done through a long, lighted tube (laparoscope) placed into the pelvis through a tiny incision in the lower abdomen.   HOME CARE INSTRUCTIONS   Only   take over-the-counter or prescription medicine as directed by your health care provider.   Pay attention to the foods you eat and your activity levels. This can help reduce the effects of PCOS.   Keep your weight under control.   Eat foods that are low in carbohydrate and high in fiber.   Exercise regularly.  SEEK MEDICAL CARE IF:   Your symptoms do not get better with medicine.   You have new symptoms.  Document Released: 06/14/2004 Document Revised: 12/09/2012 Document Reviewed: 08/06/2012  [employer] Patient Information 2015 [employer]. This information is not intended to replace advice given to you by your health care provider. Make sure you discuss any questions you have with your health care provider.

## 2014-11-25 NOTE — Progress Notes (Signed)
Patient ID: Regina Watkins, female   DOB: [birth year], 29 y.o.   MRN: [id number] History:  29 y.o. G1P0010 here today for AUB.  Pt reports 'I'm bleeding to death'.  Pt reports that her LNMP was Aug 2016.  Pt had irregular cycles from age [age] years and was placed on OCP's.  Was on OCPs for 4 years and had regular cycles.  After that her cycles returned to normal and then was placed back on OCPs age age ~5 years for contraception.  She was on them for about 2 years. Upon stopping her cycles were  Normal until Aug. Cycles started at the normal time but, she bled for 3 weeks.  Pt was using a pad a tampon with heavy bleeding and clots.  Pt took the Megace for 5 days.  She reports that she took it for 5 days.  She stopped it and has had no further bleeding.     Pt is interested in getting pregnant.  She is worried about tubal blockage since she had an ectopic.        The following portions of the patient's history were reviewed and updated as appropriate: allergies, current medications, past family history, past medical history, past social history, past surgical history and problem list.  Review of Systems:  Pertinent items are noted in HPI.  Objective:  Physical Exam Blood pressure 152/83, pulse 86, height  (1.651 m), weight 302 lb 11.2 oz (137.304 kg), last menstrual period 10/13/2014. Gen: NAD HEENT: acanthosis nigricans on back of neck Abd: Soft, nontender and nondistended; obese Pelvic: Normal appearing external genitalia; normal appearing vaginal mucosa and cervix.  Normal discharge.  Small uterus, no other palpable masses, no uterine or adnexal tenderness  10/2013 PAP- wnl  Labs and Imaging 11/29/2013 CLINICAL DATA: Dysfunctional uterine bleeding, history of salpingectomy for ectopic pregnancy, anemia  EXAM: TRANSABDOMINAL AND TRANSVAGINAL ULTRASOUND OF PELVIS  TECHNIQUE: Both transabdominal and transvaginal ultrasound examinations of the pelvis were performed.  Transabdominal technique was performed for global imaging of the pelvis including uterus, ovaries, adnexal regions, and pelvic cul-de-sac. It was necessary to proceed with endovaginal exam following the transabdominal exam to visualize the endometrium and bilateral ovaries.  COMPARISON: 07/19/2010  FINDINGS: Uterus  Measurements: 7.5 x 3.7 x 5.3 cm. No fibroids or other mass visualized.  Endometrium  Thickness: 10 mm. No focal abnormality visualized.  Right ovary  Measurements: 2.8 x 2.2 x 1.6 cm. Normal appearance/no adnexal mass.  Left ovary  Measurements: 3.5 x 3.2 x 3.5 cm. Poorly visualized. Normal appearance/no adnexal mass.  Other findings  No free fluid.  IMPRESSION: Negative pelvic ultrasound.  Assessment & Plan:  AUB x 1 cycle in pt with prior h/o irreg cycles- possible PCOS Obesity  Pt wants to get a Rx for OCPs in case her abnormal bleeding returns so she does not have to pay another co-pay.  Given a Rx for Loestrin 1/20 1 po q day for 3 months if bleeding not normal this month I have discussed with her the possibility of PCOS.  She is waiting for insurance at her job and will f/u for lab eval in 3 months or when that ins comes. Discussed with pt weight loss as a part of the management of PCOS and irreg cycles.  Have also discussed with her increaed risk of HTN, DM and elevated chol in pts with PCOS  F/u in 3 months or sooner prn      Mahima Hottle L. Harraway-Smith, M.D., [facility]

## 2014-12-19 ENCOUNTER — Telehealth: Payer: Self-pay | Admitting: *Deleted

## 2014-12-19 NOTE — Telephone Encounter (Addendum)
Received a message left on the nurse line on 12/19/14 at 1015.  Patient states she has a concern and requests a return call.  10/18  1120  Spoke w/pt and she requested test results from wet prep @ last visit. I advised pt that there are no results and it appears that a test was not performed @ her last visit on 11/25/14. Pt states she is having a discharge with slight odor and irritation. She denies itching. I advised that I will send Rx to her pharmacy to treat BV. If she is still having problems after the medication she should call our office for an appt.  Pt voiced understanding.   Diane Day RNC

## 2014-12-20 MED ORDER — METRONIDAZOLE 500 MG PO TABS: 500.0000 mg | ORAL_TABLET | Freq: Two times a day (BID) | ORAL | Status: DC

## 2015-01-17 ENCOUNTER — Telehealth: Payer: Self-pay | Admitting: *Deleted

## 2015-01-17 NOTE — Telephone Encounter (Signed)
Pt left message stating that she is calling with some concerns about her recent visit. Please call back.

## 2015-01-18 NOTE — Telephone Encounter (Signed)
Spoke with patient this morning.  She is very concerned about her bleeding.  It is very heavy.  She is taking megace and OCP and still having heavy bleeding.  Appointment given for 2:45 pm on Monday 01/23/15 with Dr. Jolayne Pantheronstant.

## 2015-01-23 ENCOUNTER — Ambulatory Visit (INDEPENDENT_AMBULATORY_CARE_PROVIDER_SITE_OTHER): Payer: Self-pay | Admitting: Obstetrics and Gynecology

## 2015-01-23 ENCOUNTER — Encounter: Payer: Self-pay | Admitting: Obstetrics and Gynecology

## 2015-01-23 VITALS — BP 134/89 | HR 106 | Temp 97.6°F | Ht 65.0 in | Wt 299.9 lb

## 2015-01-23 DIAGNOSIS — N760 Acute vaginitis: Secondary | ICD-10-CM

## 2015-01-23 DIAGNOSIS — N979 Female infertility, unspecified: Secondary | ICD-10-CM

## 2015-01-23 NOTE — Progress Notes (Signed)
Patient ID: Regina Watkins, female   DOB: [birth year], 29 y.o.   MRN: [id number] 29 yo G1P0010 with h/o ectopic pregnancy treated with salpingectomy presenting today for the evaluation of DUB. Patient was last seen in September 2016 and prescribed OCP for the management of irregular bleeding. Patient reports onset of her vaginal bleeding on 11/4. She started OCP 2 weeks ago without improvement in her bleeding and added megace to her regimen. She reports bleeding has now stopped. She comes in today wanting an explanation on why her bleeding is so irregular and whether or not she has fibroids. Patient also reports that a wet prep was suppose to have been collected at her last visit but no results were available for review. She would like the wet prep collected today. She denies any abnormal discharge or vaginal irritation  Past Medical History  Diagnosis Date  . No pertinent past medical history   . Strep throat   . BV (bacterial vaginosis)   . Trichomonas   . Obesity   . Ectopic pregnancy   . Anemia   . Infection     UTI   Past Surgical History  Procedure Laterality Date  . Salpingectomy      removal of ectopic preg and tube   Family History  Problem Relation Age of Onset  . Hypertension Mother   . Heart disease Father     CHF  . Anesthesia problems Neg Hx   . Hypotension Neg Hx   . Malignant hyperthermia Neg Hx   . Pseudochol deficiency Neg Hx   . Diabetes Maternal Grandmother   . Hypertension Maternal Grandmother   . Cancer Maternal Grandmother     breast  . Cancer Paternal Grandmother    Social History  Substance Use Topics  . Smoking status: Never Smoker   . Smokeless tobacco: Never Used  . Alcohol Use: No   ROS See pertinent in HPI  Blood pressure 134/89, pulse 106, temperature 97.6 F (36.4 C), temperature source Oral, height 5\' 5"  (1.651 m), weight 299 lb 14.4 oz (136.034 kg), last menstrual period 01/06/2015. GENERAL: Well-developed, well-nourished female in no  acute distress. obese ABDOMEN: Soft, nontender, nondistended. No organomegaly. Obese PELVIC: Normal external female genitalia. Vagina is pink and rugated.  Normal discharge. Normal appearing cervix. Bimanual exam limited secondary to body habitus EXTREMITIES: No cyanosis, clubbing, or edema, 2+ distal pulses.  A/P 29 yo with DUB - Reviewed normal ultrasound of 11/2013- no fibroids were visualized - Discussed likely cause of irregular bleeding due to PCOS - Discussed weight loss management to help in regulating cycles - Advised patient to take OCP to regulate cycles until she is ready to conceive in one year - Patient is interested in hysterosalpingogram to assess the patency of her remaining tube. She is willing to pay out of pocket - Wet prep collected. Patient will be contacted with any abnormal results - RTC if abnormal vaginal bleeding persists after 3 months of OCP or prn

## 2015-01-24 ENCOUNTER — Telehealth: Payer: Self-pay | Admitting: *Deleted

## 2015-01-24 LAB — WET PREP, GENITAL
TRICH WET PREP: NONE SEEN
Yeast Wet Prep HPF POC: NONE SEEN

## 2015-01-24 MED ORDER — METRONIDAZOLE 500 MG PO TABS: 500.0000 mg | ORAL_TABLET | Freq: Two times a day (BID) | ORAL | Status: DC

## 2015-01-24 NOTE — Telephone Encounter (Signed)
Patient called into front office stating she is returning our call

## 2015-01-24 NOTE — Telephone Encounter (Signed)
Called patient, no answer- left message to call us back at the clinics

## 2015-01-24 NOTE — Addendum Note (Signed)
Addended by: Catalina AntiguaONSTANT, Hall Birchard on: 01/24/2015 07:33 AM   Modules accepted: Orders

## 2015-01-24 NOTE — Telephone Encounter (Signed)
Per Dr. Hyman Bibleonstant Rayel has BV, prescription for flagyl sent to her pharmacy.   Called [facility] and left a message we are calling with some non- urgent information- please call clinic. With best number and time to call you.

## 2015-01-25 NOTE — Telephone Encounter (Signed)
Called [facility] and notified her that she has BV and rx sent to her pharmacy.  She voices understanding.

## 2015-03-27 ENCOUNTER — Encounter (HOSPITAL_COMMUNITY): Payer: Self-pay | Admitting: *Deleted

## 2015-03-27 ENCOUNTER — Inpatient Hospital Stay (HOSPITAL_COMMUNITY)
Admission: AD | Admit: 2015-03-27 | Discharge: 2015-03-27 | Disposition: A | Discharge status: Home / Self Care | Payer: Self-pay | Source: Ambulatory Visit | Attending: Family Medicine | Admitting: Family Medicine

## 2015-03-27 DIAGNOSIS — R1030 Lower abdominal pain, unspecified: Secondary | ICD-10-CM | POA: Insufficient documentation

## 2015-03-27 DIAGNOSIS — B9689 Other specified bacterial agents as the cause of diseases classified elsewhere: Secondary | ICD-10-CM

## 2015-03-27 DIAGNOSIS — R102 Pelvic and perineal pain: Secondary | ICD-10-CM | POA: Insufficient documentation

## 2015-03-27 DIAGNOSIS — N760 Acute vaginitis: Secondary | ICD-10-CM

## 2015-03-27 DIAGNOSIS — A499 Bacterial infection, unspecified: Secondary | ICD-10-CM

## 2015-03-27 LAB — URINE MICROSCOPIC-ADD ON

## 2015-03-27 LAB — URINALYSIS, ROUTINE W REFLEX MICROSCOPIC
BILIRUBIN URINE: NEGATIVE
Glucose, UA: NEGATIVE mg/dL
KETONES UR: 15 mg/dL — AB
Leukocytes, UA: NEGATIVE
NITRITE: NEGATIVE
PH: 6 (ref 5.0–8.0)
Protein, ur: NEGATIVE mg/dL
Specific Gravity, Urine: 1.02 (ref 1.005–1.030)

## 2015-03-27 LAB — CBC
HCT: 28.2 % — ABNORMAL LOW (ref 36.0–46.0)
Hemoglobin: 8.7 g/dL — ABNORMAL LOW (ref 12.0–15.0)
MCH: 19.2 pg — AB (ref 26.0–34.0)
MCHC: 30.9 g/dL (ref 30.0–36.0)
MCV: 62.4 fL — AB (ref 78.0–100.0)
PLATELETS: 471 10*3/uL — AB (ref 150–400)
RBC: 4.52 MIL/uL (ref 3.87–5.11)
RDW: 16.5 % — ABNORMAL HIGH (ref 11.5–15.5)
WBC: 10.7 10*3/uL — ABNORMAL HIGH (ref 4.0–10.5)

## 2015-03-27 LAB — WET PREP, GENITAL
Sperm: NONE SEEN
Trich, Wet Prep: NONE SEEN
WBC WET PREP: NONE SEEN
YEAST WET PREP: NONE SEEN

## 2015-03-27 LAB — POCT PREGNANCY, URINE: Preg Test, Ur: NEGATIVE

## 2015-03-27 MED ORDER — METRONIDAZOLE 500 MG PO TABS: 500.0000 mg | ORAL_TABLET | Freq: Two times a day (BID) | ORAL | Status: DC

## 2015-03-27 NOTE — Discharge Instructions (Signed)

## 2015-03-27 NOTE — MAU Note (Addendum)
PT SAYS SHE  HAD  ECTOPIC  IN 2003  - DOESN'T  REMEMBER  WHICH  SIDE.   SURGERY  WITH  DR  Jean Rosenthal- MOORE            SAYS HAS PAIN IN LOWER ABD   -  STARTED  Friday  NIGHT.      AND  WHEN SHE  HAS  SEX- PAIN IN LEFT  GROIN.      HAS BEEN  NAUSEATED  X1 WEEK-    AND  VOMITING.   VOMITED  TODAY X2.      NO BIRTH CONTROL.   LAST SEX-   1-21.       NO HPT.        WHEN SHE WIPES  - HAS PINK  SPOTTING  X1 WEEK.        NO MED   FOR  PAIN

## 2015-03-27 NOTE — MAU Provider Note (Signed)
History     CSN: [id number]  Arrival date and time: 03/27/15 1324   First Provider Initiated Contact with Patient 03/27/15 1956      No chief complaint on file.  HPI Comments: [name] is a 30 y.o. female G1P0010 presenting with left lower abdominal pain. The pain started 3-4 days ago. The pain comes and goes and seems to worsen with intercourse. She has noticed a small amount of spotting around the same time the pain started.   She eats a lot of fast foods and fried foods and feels this may be contributing.   She had to use milk of magnesia last week due to constipation.   Pelvic Pain The patient's primary symptoms include pelvic pain and vaginal bleeding. This is a new problem. The current episode started in the past 7 days. The problem occurs intermittently. The problem has been unchanged. The pain is moderate. The problem affects both sides. She is not pregnant. Associated symptoms include abdominal pain, nausea and vomiting. Pertinent negatives include no chills, constipation (Last BM was today. stools have been hard. ), diarrhea, dysuria, fever, frequency or urgency. The vaginal discharge was bloody. The vaginal bleeding is spotting. She has not been passing clots. She has not been passing tissue. She has tried nothing for the symptoms. She is sexually active. It is unknown whether or not her partner has an STD.       OB History    Gravida Para Term Preterm AB TAB SAB Ectopic Multiple Living        Past Medical History  Diagnosis Date  . No pertinent past medical history   . Strep throat   . BV (bacterial vaginosis)   . Trichomonas   . Obesity   . Ectopic pregnancy   . Anemia   . Infection     UTI    Past Surgical History  Procedure Laterality Date  . Salpingectomy      removal of ectopic preg and tube    Family History  Problem Relation Age of Onset  . Hypertension Mother   . Heart disease Father     CHF  . Anesthesia  problems Neg Hx   . Hypotension Neg Hx   . Malignant hyperthermia Neg Hx   . Pseudochol deficiency Neg Hx   . Diabetes Maternal Grandmother   . Hypertension Maternal Grandmother   . Cancer Maternal Grandmother     breast  . Cancer Paternal Grandmother     Social History  Substance Use Topics  . Smoking status: Never Smoker   . Smokeless tobacco: Never Used  . Alcohol Use: No    Allergies: No Known Allergies  Prescriptions prior to admission  Medication Sig Dispense Refill Last Dose  . ferrous sulfate 325 (65 FE) MG tablet Take 1 tablet (325 mg total) by mouth daily with breakfast. (Patient not taking: Reported on 04/11/2014) 30 tablet 11 Not Taking  . ibuprofen (ADVIL,MOTRIN) 800 MG tablet Take 1 tablet (800 mg total) by mouth every 8 (eight) hours as needed for moderate pain. 15 tablet 0 Taking  . megestrol (MEGACE) 20 MG tablet Take 4 tablets (80 mg total) by mouth 2 (two) times daily. Continue this until bleeding stops. Then 2 tablets twice a day until clinic appointment 180 tablet 2 Taking  . megestrol (MEGACE) 40 MG tablet Take 3/day (at the same time) for 5 days; 2/day for 5 days, then 1/day PO prn bleeding (  Patient not taking: Reported on 11/25/2014) 60 tablet 3 Not Taking  . metroNIDAZOLE (FLAGYL) 500 MG tablet Take 1 tablet (500 mg total) by mouth 2 (two) times daily. 14 tablet 0   . norethindrone-ethinyl estradiol (JUNEL FE,GILDESS FE,LOESTRIN FE) 1-20 MG-MCG tablet Take 1 tablet by mouth daily. 1 Package 11 Taking   Results for orders placed or performed during the hospital encounter of 03/27/15 (from the past 48 hour(s))  Urinalysis, Routine w reflex microscopic (not at [facility])     Status: Abnormal   Collection Time: 03/27/15  7:24 PM  Result Value Ref Range   Color, Urine YELLOW YELLOW   APPearance CLEAR CLEAR   Specific Gravity, Urine 1.020 1.005 - 1.030   pH 6.0 5.0 - 8.0   Glucose, UA NEGATIVE NEGATIVE mg/dL   Hgb urine dipstick MODERATE (A) NEGATIVE   Bilirubin  Urine NEGATIVE NEGATIVE   Ketones, ur 15 (A) NEGATIVE mg/dL   Protein, ur NEGATIVE NEGATIVE mg/dL   Nitrite NEGATIVE NEGATIVE   Leukocytes, UA NEGATIVE NEGATIVE  Urine microscopic-add on     Status: Abnormal   Collection Time: 03/27/15  7:24 PM  Result Value Ref Range   Squamous Epithelial / LPF 0-5 (A) NONE SEEN   WBC, UA 0-5 0 - 5 WBC/hpf   RBC / HPF 0-5 0 - 5 RBC/hpf   Bacteria, UA FEW (A) NONE SEEN  Pregnancy, urine POC     Status: None   Collection Time: 03/27/15  7:32 PM  Result Value Ref Range   Preg Test, Ur NEGATIVE NEGATIVE    Comment:        THE SENSITIVITY OF THIS METHODOLOGY IS >24 mIU/mL     Review of Systems  Constitutional: Negative for fever and chills.  Gastrointestinal: Positive for nausea, vomiting and abdominal pain. Negative for diarrhea and constipation (Last BM was today. stools have been hard. ).  Genitourinary: Positive for pelvic pain. Negative for dysuria, urgency and frequency.   Physical Exam   Blood pressure 132/89, pulse 107, temperature 98.5 F (36.9 C), temperature source Oral, resp. rate 18, height  (1.651 m), weight 288 lb 6 oz (130.806 kg), last menstrual period 03/11/2015.  Physical Exam  Nursing note and vitals reviewed. Constitutional: She is oriented to person, place, and time. She appears well-developed and well-nourished. No distress.  HENT:  Head: Normocephalic.  Cardiovascular: Normal rate.   Respiratory: Effort normal.  GI: Soft. There is no tenderness. There is no rebound.  Genitourinary:   External: no lesion Vagina: small amount of pink discharge Cervix: pink, smooth, no CMT Uterus: NSSC Adnexa: NT   Neurological: She is alert and oriented to person, place, and time.  Skin: Skin is warm and dry.  Psychiatric: She has a normal mood and affect.   Results for orders placed or performed during the hospital encounter of 03/27/15 (from the past 24 hour(s))  Urinalysis, Routine w reflex microscopic (not at [facility])      Status: Abnormal   Collection Time: 03/27/15  7:24 PM  Result Value Ref Range   Color, Urine YELLOW YELLOW   APPearance CLEAR CLEAR   Specific Gravity, Urine 1.020 1.005 - 1.030   pH 6.0 5.0 - 8.0   Glucose, UA NEGATIVE NEGATIVE mg/dL   Hgb urine dipstick MODERATE (A) NEGATIVE   Bilirubin Urine NEGATIVE NEGATIVE   Ketones, ur 15 (A) NEGATIVE mg/dL   Protein, ur NEGATIVE NEGATIVE mg/dL   Nitrite NEGATIVE NEGATIVE   Leukocytes, UA NEGATIVE NEGATIVE  Urine microscopic-add on  Status: Abnormal   Collection Time: 03/27/15  7:24 PM  Result Value Ref Range   Squamous Epithelial / LPF 0-5 (A) NONE SEEN   WBC, UA 0-5 0 - 5 WBC/hpf   RBC / HPF 0-5 0 - 5 RBC/hpf   Bacteria, UA FEW (A) NONE SEEN  Pregnancy, urine POC     Status: None   Collection Time: 03/27/15  7:32 PM  Result Value Ref Range   Preg Test, Ur NEGATIVE NEGATIVE  CBC     Status: Abnormal   Collection Time: 03/27/15  8:28 PM  Result Value Ref Range   WBC 10.7 (H) 4.0 - 10.5 K/uL   RBC 4.52 3.87 - 5.11 MIL/uL   Hemoglobin 8.7 (L) 12.0 - 15.0 g/dL   HCT 16.1 (L) 09.6 - 04.5 %   MCV 62.4 (L) 78.0 - 100.0 fL   MCH 19.2 (L) 26.0 - 34.0 pg   MCHC 30.9 30.0 - 36.0 g/dL   RDW 40.9 (H) 81.1 - 91.4 %   Platelets 471 (H) 150 - 400 K/uL  Wet prep, genital     Status: Abnormal   Collection Time: 03/27/15  8:35 PM  Result Value Ref Range   Yeast Wet Prep HPF POC NONE SEEN NONE SEEN   Trich, Wet Prep NONE SEEN NONE SEEN   Clue Cells Wet Prep HPF POC PRESENT (A) NONE SEEN   WBC, Wet Prep HPF POC NONE SEEN NONE SEEN   Sperm NONE SEEN     MAU Course  Procedures  None  MDM  Report given to Thressa Sheller CNM who resumes the plan of care.   Duane Lope, NP   Assessment and Plan   1. Bacterial vaginal infection    DC home Comfort measures reviewed  RX: flagyl  BID x 7 days  Return to MAU as needed FU with OB as planned  Follow-up Information    Follow up with [facility].   Specialty:   Obstetrics and Gynecology   Why:  As needed   Contact information:   [address] [place] [address] [phone]

## 2015-03-28 LAB — GC/CHLAMYDIA PROBE AMP (~~LOC~~) NOT AT ARMC
CHLAMYDIA, DNA PROBE: NEGATIVE
Neisseria Gonorrhea: NEGATIVE

## 2015-03-30 LAB — RPR: RPR Ser Ql: NONREACTIVE

## 2015-03-30 LAB — HIV ANTIBODY (ROUTINE TESTING W REFLEX): HIV Screen 4th Generation wRfx: NONREACTIVE

## 2015-10-03 ENCOUNTER — Encounter (HOSPITAL_COMMUNITY): Payer: Self-pay | Admitting: Emergency Medicine

## 2015-10-03 ENCOUNTER — Emergency Department (HOSPITAL_COMMUNITY)
Admission: EM | Admit: 2015-10-03 | Discharge: 2015-10-03 | Disposition: A | Discharge status: Home / Self Care | Payer: Self-pay | Attending: Emergency Medicine | Admitting: Emergency Medicine

## 2015-10-03 DIAGNOSIS — L03317 Cellulitis of buttock: Secondary | ICD-10-CM | POA: Insufficient documentation

## 2015-10-03 DIAGNOSIS — Z91040 Latex allergy status: Secondary | ICD-10-CM | POA: Insufficient documentation

## 2015-10-03 MED ORDER — CEPHALEXIN 500 MG PO CAPS: 500.0000 mg | ORAL_CAPSULE | Freq: Four times a day (QID) | ORAL | 0 refills | Status: DC

## 2015-10-03 MED ORDER — NAPROXEN 500 MG PO TABS
500.0000 mg | ORAL_TABLET | Freq: Once | ORAL | Status: AC
  Administered 2015-10-03: 500 mg via ORAL
  Filled 2015-10-03: qty 1

## 2015-10-03 MED ORDER — LIDOCAINE HCL (PF) 1 % IJ SOLN
INTRAMUSCULAR | Status: AC
  Filled 2015-10-03: qty 30

## 2015-10-03 MED ORDER — SULFAMETHOXAZOLE-TRIMETHOPRIM 800-160 MG PO TABS: 1.0000 | ORAL_TABLET | Freq: Two times a day (BID) | ORAL | 0 refills | Status: AC

## 2015-10-03 MED ORDER — NAPROXEN 500 MG PO TABS: 500.0000 mg | ORAL_TABLET | Freq: Two times a day (BID) | ORAL | 0 refills | Status: DC

## 2015-10-03 MED ORDER — LIDOCAINE HCL (PF) 1 % IJ SOLN
5.0000 mL | Freq: Once | INTRAMUSCULAR | Status: DC
  Filled 2015-10-03: qty 5

## 2015-10-03 MED ORDER — LIDOCAINE HCL 1 % IJ SOLN
INTRAMUSCULAR | Status: AC
  Filled 2015-10-03: qty 20

## 2015-10-03 NOTE — ED Provider Notes (Signed)
[facility]-EMERGENCY DEPT Provider Note   CSN: [id number] Arrival date & time: 10/03/15  1825  First Provider Contact:  None    By signing my name below, I, Majel Homer, attest that this documentation has been prepared under the direction and in the presence of non-physician practitioner, Cheri Fowler, PA-C. Electronically Signed: Majel Homer, Scribe. 10/03/2015. 8:02 PM.  History   Chief Complaint Chief Complaint  Patient presents with  . Recurrent Skin Infections   The history is provided by the patient. No language interpreter was used.   HPI Comments: Regina Watkins is a 30 y.o. female who presents to the Emergency Department complaining of gradually improving, purulent bump on right buttock that first appeared last week. Pt reports she used warm compresses until it "popped" two days ago; she states it is still actively draining now in the ED. Pt notes she feels a "hardness" surrounding the draining bump. She states she came to the ED because she is going to the beach next week and wants it to be healed by then. She denies fever, chills, nausea and vomiting.   Past Medical History:  Diagnosis Date  . Anemia   . BV (bacterial vaginosis)   . Ectopic pregnancy   . Infection    UTI  . No pertinent past medical history   . Obesity   . Strep throat   . Trichomonas     Patient Active Problem List   Diagnosis Date Noted  . Mastalgia 06/11/2011  . 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 History of ectopic pregnancy 06/11/2011    Past Surgical History:  Procedure Laterality Date  . SALPINGECTOMY     removal of ectopic preg and tube    OB History    Gravida Para Term Preterm AB Living   1 0 0 0 1 0   SAB TAB Ectopic Multiple Live Births   0 0 1 0         Home Medications    Prior to Admission medications   Medication Sig Start Date End Date Taking? Authorizing Provider  cephALEXin (KEFLEX) 500 MG capsule Take 1 capsule (500 mg total) by mouth 4 (four) times daily.  10/03/15   Cheri Fowler, PA-C  megestrol (MEGACE) 40 MG tablet Take 3/day (at the same time) for 5 days; 2/day for 5 days, then 1/day PO prn bleeding 11/01/14   Lori A Clemmons, CNM  metroNIDAZOLE (FLAGYL) 500 MG tablet Take 1 tablet (500 mg total) by mouth 2 (two) times daily. 03/27/15   Armando Reichert, CNM  naproxen (NAPROSYN) 500 MG tablet Take 1 tablet (500 mg total) by mouth 2 (two) times daily. 10/03/15   Cheri Fowler, PA-C  Prenatal Vit-Fe Fumarate-FA (PRENATAL MULTIVITAMIN) TABS tablet Take 1 tablet by mouth daily at 12 noon.    Historical Provider, MD  sulfamethoxazole-trimethoprim (BACTRIM DS,SEPTRA DS) 800-160 MG tablet Take 1 tablet by mouth 2 (two) times daily. 10/03/15 10/10/15  Cheri Fowler, PA-C    Family History Family History  Problem Relation Age of Onset  . Heart disease Father     CHF  . Diabetes Maternal Grandmother   . Hypertension Maternal Grandmother   . Cancer Maternal Grandmother     breast  . Cancer Paternal Grandmother   . Hypertension Mother   . Anesthesia problems Neg Hx   . Hypotension Neg Hx   . Malignant hyperthermia Neg Hx   . Pseudochol deficiency Neg Hx     Social History Social History  Substance Use Topics  .  Smoking status: Never Smoker  . Smokeless tobacco: Never Used  . Alcohol use No     Allergies   Latex   Review of Systems Review of Systems  Constitutional: Negative for chills and fever.  Gastrointestinal: Negative for nausea and vomiting.  Skin: Positive for wound (right buttock).  All other systems reviewed and are negative.   Physical Exam Updated Vital Signs BP 140/72 (BP Location: Right Arm)   Pulse 96   Temp 98.4 F (36.9 C) (Oral)   Resp 18   SpO2 99%   Physical Exam  Constitutional: She is oriented to person, place, and time. She appears well-developed and well-nourished.  HENT:  Head: Normocephalic and atraumatic.  Right Ear: External ear normal.  Left Ear: External ear normal.  Eyes: Conjunctivae are normal. No  scleral icterus.  Neck: No tracheal deviation present.  Pulmonary/Chest: Effort normal. No respiratory distress.  Abdominal: She exhibits no distension.  Musculoskeletal: Normal range of motion.  Neurological: She is alert and oriented to person, place, and time.  Skin: Skin is warm and dry.  Right inferior buttock with 3cm area of induration and central small area of fluctuance.  No drainage.  Psychiatric: She has a normal mood and affect. Her behavior is normal.    ED Treatments / Results  Labs (all labs ordered are listed, but only abnormal results are displayed) Labs Reviewed - No data to display  EKG  EKG Interpretation None      Radiology No results found.  Procedures Procedures   INCISION AND DRAINAGE Performed by: Cheri Fowler Consent: Verbal consent obtained. Risks and benefits: risks, benefits and alternatives were discussed Type: abscess  Body area: right buttock  Anesthesia: local infiltration  Incision was made with a scalpel.  Local anesthetic: lidocaine 1% without epinephrine  Anesthetic total: 3 ml  Complexity: complex Blunt dissection to break up loculations  Drainage: bloody, minimally pururlent  Drainage amount: minimal  Packing material: none  Patient tolerance: Patient tolerated the procedure well with no immediate complications.    DIAGNOSTIC STUDIES:  Oxygen Saturation is 99% on RA, normal by my interpretation.    COORDINATION OF CARE:  7:59 PM Discussed treatment plan, which includes I & D with pt at bedside and pt agreed to plan.  Medications Ordered in ED Medications  lidocaine (PF) (XYLOCAINE) 1 % injection 5 mL (not administered)  naproxen (NAPROSYN) tablet 500 mg (500 mg Oral Given 10/03/15 2006)    Initial Impression / Assessment and Plan / ED Course  I have reviewed the triage vital signs and the nursing notes.  Pertinent labs & imaging results that were available during my care of the patient were reviewed by me and  considered in my medical decision making (see chart for details).  Clinical Course    Patient presents with cellulitis and small fluctuant area.  I&D performed with little drainage.  No systemic symptoms.  VSS, patient appears well, non-toxic or septic.  Plan to discharge home with naproxen, bactrim, and keflex.  Return precautions discussed.  Patient agrees and acknowledges the above plan for discharge.   Final Clinical Impressions(s) / ED Diagnoses   Final diagnoses:  Cellulitis of buttock    New Prescriptions New Prescriptions   CEPHALEXIN (KEFLEX) 500 MG CAPSULE    Take 1 capsule (500 mg total) by mouth 4 (four) times daily.   NAPROXEN (NAPROSYN) 500 MG TABLET    Take 1 tablet (500 mg total) by mouth 2 (two) times daily.   SULFAMETHOXAZOLE-TRIMETHOPRIM (BACTRIM DS,SEPTRA  DS) 800-160 MG TABLET    Take 1 tablet by mouth 2 (two) times daily.   I personally performed the services described in this documentation, which was scribed in my presence. The recorded information has been reviewed and is accurate.     Cheri Fowler, PA-C 10/03/15 1610    Rolan Bucco, MD 10/03/15 [phone]

## 2015-10-03 NOTE — ED Triage Notes (Signed)
Patient c/o boil in buttocks for week.  Patient states that has been draining.  Patient still having some pain and wants it checked out since she is going to the beach next week and wants it healed by then.

## 2015-11-22 ENCOUNTER — Ambulatory Visit: Payer: Self-pay | Admitting: Advanced Practice Midwife

## 2016-01-03 ENCOUNTER — Inpatient Hospital Stay (HOSPITAL_COMMUNITY)
Admission: AD | Admit: 2016-01-03 | Discharge: 2016-01-03 | Disposition: A | Discharge status: Home / Self Care | Payer: Self-pay | Source: Ambulatory Visit | Attending: Obstetrics & Gynecology | Admitting: Obstetrics & Gynecology

## 2016-01-03 ENCOUNTER — Encounter (HOSPITAL_COMMUNITY): Payer: Self-pay | Admitting: *Deleted

## 2016-01-03 DIAGNOSIS — N920 Excessive and frequent menstruation with regular cycle: Secondary | ICD-10-CM | POA: Insufficient documentation

## 2016-01-03 DIAGNOSIS — N939 Abnormal uterine and vaginal bleeding, unspecified: Secondary | ICD-10-CM | POA: Insufficient documentation

## 2016-01-03 LAB — CBC
HEMATOCRIT: 31.1 % — AB (ref 36.0–46.0)
HEMOGLOBIN: 10 g/dL — AB (ref 12.0–15.0)
MCH: 21.5 pg — AB (ref 26.0–34.0)
MCHC: 32.2 g/dL (ref 30.0–36.0)
MCV: 66.7 fL — ABNORMAL LOW (ref 78.0–100.0)
Platelets: 385 10*3/uL (ref 150–400)
RBC: 4.66 MIL/uL (ref 3.87–5.11)
RDW: 18.5 % — ABNORMAL HIGH (ref 11.5–15.5)
WBC: 7 10*3/uL (ref 4.0–10.5)

## 2016-01-03 LAB — URINE MICROSCOPIC-ADD ON
BACTERIA UA: NONE SEEN
WBC, UA: NONE SEEN WBC/hpf (ref 0–5)

## 2016-01-03 LAB — URINALYSIS, ROUTINE W REFLEX MICROSCOPIC
Bilirubin Urine: NEGATIVE
GLUCOSE, UA: NEGATIVE mg/dL
Ketones, ur: NEGATIVE mg/dL
LEUKOCYTES UA: NEGATIVE
Nitrite: NEGATIVE
PH: 6 (ref 5.0–8.0)
Protein, ur: NEGATIVE mg/dL
SPECIFIC GRAVITY, URINE: 1.02 (ref 1.005–1.030)

## 2016-01-03 LAB — WET PREP, GENITAL
Clue Cells Wet Prep HPF POC: NONE SEEN
SPERM: NONE SEEN
TRICH WET PREP: NONE SEEN
YEAST WET PREP: NONE SEEN

## 2016-01-03 LAB — POCT PREGNANCY, URINE: Preg Test, Ur: NEGATIVE

## 2016-01-03 MED ORDER — MEGESTROL ACETATE 40 MG PO TABS: ORAL_TABLET | ORAL | 3 refills | Status: DC

## 2016-01-03 MED ORDER — MEDROXYPROGESTERONE ACETATE 10 MG PO TABS: 10.0000 mg | ORAL_TABLET | Freq: Every day | ORAL | Status: DC

## 2016-01-03 MED ORDER — CENTRUM PO CHEW: 1.0000 | CHEWABLE_TABLET | Freq: Every day | ORAL | 2 refills | Status: DC

## 2016-01-03 NOTE — MAU Provider Note (Signed)
Regina Watkins is a G1P0010 who is here for vaginal bleeding.  She is not pregnant. She has no constipation and has bowel movements regularly. She has no pain with urination or intercourse. Her last period was in July and she usually has irregular periods. She also feels week and dizzy and "wants an ultrasound to know what's going." She says that she is saturating multiple pads per day. She has been on megace for this problem but has not taken it in the past 30 days because she says that it doesn't work any more. History     CSN: [id number]  Arrival date and time: 01/03/16 1230   First Provider Initiated Contact with Patient 01/03/16 1325      Chief Complaint  Patient presents with  . Vaginal Bleeding   Vaginal Bleeding  The patient's primary symptoms include vaginal bleeding. The patient's pertinent negatives include no pelvic pain or vaginal discharge. The current episode started in the past 7 days. The problem occurs constantly. The problem has been unchanged. The pain is mild. Associated symptoms include abdominal pain. The vaginal bleeding is typical of menses. She has been passing clots. She has not been passing tissue. Nothing aggravates the symptoms. She has tried nothing for the symptoms. She is sexually active. She uses nothing for contraception. Her menstrual history has been irregular.    OB History    Gravida Para Term Preterm AB Living   1 0 0 0 1 0   SAB TAB Ectopic Multiple Live Births   0 0 1 0        Past Medical History:  Diagnosis Date  . Anemia   . BV (bacterial vaginosis)   . Ectopic pregnancy   . Infection    UTI  . No pertinent past medical history   . Obesity   . Strep throat   . Trichomonas     Past Surgical History:  Procedure Laterality Date  . SALPINGECTOMY     removal of ectopic preg and tube    Family History  Problem Relation Age of Onset  . Heart disease Father     CHF  . Diabetes Maternal Grandmother   . Hypertension Maternal  Grandmother   . Cancer Maternal Grandmother     breast  . Cancer Paternal Grandmother   . Hypertension Mother   . Anesthesia problems Neg Hx   . Hypotension Neg Hx   . Malignant hyperthermia Neg Hx   . Pseudochol deficiency Neg Hx     Social History  Substance Use Topics  . Smoking status: Never Smoker  . Smokeless tobacco: Never Used  . Alcohol use No    Allergies:  Allergies  Allergen Reactions  . Latex Other (See Comments)    Latex condoms cause bacterial infections and irritation.    Prescriptions Prior to Admission  Medication Sig Dispense Refill Last Dose  . acetaminophen (TYLENOL) 325 MG tablet Take 650 mg by mouth every 6 (six) hours as needed for moderate pain.   01/02/2016 at Unknown time  . cephALEXin (KEFLEX) 500 MG capsule Take 1 capsule (500 mg total) by mouth 4 (four) times daily. (Patient not taking: Reported on 01/03/2016) 28 capsule 0 Not Taking at Unknown time  . megestrol (MEGACE) 40 MG tablet Take 3/day (at the same time) for 5 days; 2/day for 5 days, then 1/day PO prn bleeding (Patient not taking: Reported on 01/03/2016) 60 tablet 3 Not Taking at Unknown time  . metroNIDAZOLE (FLAGYL) 500 MG tablet Take 1 tablet (  500 mg total) by mouth 2 (two) times daily. (Patient not taking: Reported on 01/03/2016) 14 tablet 0 Not Taking at Unknown time  . naproxen (NAPROSYN) 500 MG tablet Take 1 tablet (500 mg total) by mouth 2 (two) times daily. (Patient not taking: Reported on 01/03/2016) 30 tablet 0 Not Taking at Unknown time    Review of Systems  HENT: Negative.   Eyes: Negative.   Respiratory: Negative.   Cardiovascular: Negative.   Gastrointestinal: Positive for abdominal pain.  Genitourinary: Positive for vaginal bleeding. Negative for pelvic pain and vaginal discharge.  Musculoskeletal: Negative.   Skin: Negative.   Neurological: Positive for dizziness and weakness.  Endo/Heme/Allergies: Negative.   Psychiatric/Behavioral: Negative.    Physical Exam    Blood pressure 109/81, pulse 114, temperature 98.3 F (36.8 C), temperature source Oral, resp. rate 18, height 5\' 6"  (1.676 m), weight 282 lb 0.8 oz (127.9 kg), last menstrual period 09/16/2015, SpO2 98 %.  Physical Exam  Constitutional: She is oriented to person, place, and time. She appears well-developed and well-nourished.  Eyes: Right eye exhibits no discharge. Left eye exhibits no discharge.  Neck: Normal range of motion.  Respiratory: Effort normal.  GI: Soft. There is tenderness.  Tenderness with mild palpation over the suprapubic area  Genitourinary:  Genitourinary Comments: Labia and vaginal walls are moist and pink with no discharge or odor. No lesions noted. Cervix is pink and non-erthymatous. Blood pooling in the posterior fornix; no clots noted. Bimanual exam shows non-palpable ovaries and no CMT.   Musculoskeletal: Normal range of motion.  Neurological: She is alert and oriented to person, place, and time.  Skin: Skin is warm and dry.    MAU Course  Procedures  MDM -Bimanual exam -wet prep -GC cultures -CBC   Assessment and Plan  Patient Regina Watkins is a 30 year old G1 P0010 who is here with abnormal uterine bleeding which she thinks is her period. Due to her low H/H and high RDW, most likely she is iron deficient anemic. Discharge home with prescription for provera e and prenatal vitamins with iron (patient desires to get pregnant).  She is to start the provera on 01/05/2016 if her bleeding has not subsided by then. Strongly encouraged her to eat a diet high in protein and to make a follow-up appointment at [facility] to discuss managing her menorrhagia.   Charlesetta GaribaldiKathryn Lorraine Sharnice Bosler CNM 01/03/2016, 1:48 PM

## 2016-01-03 NOTE — Discharge Instructions (Signed)

## 2016-01-03 NOTE — MAU Note (Signed)
Patient presents to MAU with irregular bleeding after missing period for past 3 months.  States LMP was approx. 09/16/15 and started bleeding again 12/29/15.  "I want an ultrasound or something to see what's going on with my body.  My periods usually aren't irregular. I am not pregnant."  Patient states she was [facility].  Is not using any form of BC.  Has tried taking tylenol for lower abdominal cramps and that doesn't help.  Hx of ectopic pregnancy in the past with removal of right tube.  Lower abdominal pain today is 6/10 bilateral pain.

## 2016-01-04 LAB — GC/CHLAMYDIA PROBE AMP (~~LOC~~) NOT AT ARMC
Chlamydia: NEGATIVE
NEISSERIA GONORRHEA: NEGATIVE

## 2016-10-23 DIAGNOSIS — Z79899 Other long term (current) drug therapy: Secondary | ICD-10-CM | POA: Insufficient documentation

## 2016-10-23 DIAGNOSIS — H9201 Otalgia, right ear: Secondary | ICD-10-CM | POA: Insufficient documentation

## 2016-10-23 NOTE — ED Triage Notes (Signed)
Pt reports right ear pain x1 week now having difficulty hearing from right ear and occasional dizziness

## 2016-10-24 ENCOUNTER — Emergency Department (HOSPITAL_COMMUNITY)
Admission: EM | Admit: 2016-10-24 | Discharge: 2016-10-24 | Disposition: A | Discharge status: Home / Self Care | Payer: Self-pay | Attending: Emergency Medicine | Admitting: Emergency Medicine

## 2016-10-24 DIAGNOSIS — H9201 Otalgia, right ear: Secondary | ICD-10-CM

## 2016-10-24 MED ORDER — AMOXICILLIN 500 MG PO CAPS: 500.0000 mg | ORAL_CAPSULE | Freq: Three times a day (TID) | ORAL | 0 refills | Status: DC

## 2016-10-24 MED ORDER — AMOXICILLIN 500 MG PO CAPS
500.0000 mg | ORAL_CAPSULE | Freq: Once | ORAL | Status: AC
  Administered 2016-10-24: 500 mg via ORAL
  Filled 2016-10-24: qty 1

## 2016-10-24 NOTE — ED Provider Notes (Signed)
[facility]-EMERGENCY DEPT Provider Note   CSN: [id number] Arrival date & time: 10/23/16  2223     History   Chief Complaint Chief Complaint  Patient presents with  . Otalgia    HPI Regina Watkins is a 31 y.o. female.  Patient presents to the emergency department with chief complaint of right ear pain. She also complains of some right jaw pain. She states that the symptoms started about a week ago. She denies any fevers, chills, nausea, vomiting. She states that she has had it here fullness sensation, and occasionally feels dizzy. She denies having taken anything for symptoms. There are no modifying factors. There are no other associated symptoms.   The history is provided by the patient. No language interpreter was used.    Past Medical History:  Diagnosis Date  . Anemia   . BV (bacterial vaginosis)   . Ectopic pregnancy   . Infection    UTI  . No pertinent past medical history   . Obesity   . Strep throat   . Trichomonas     Patient Active Problem List   Diagnosis Date Noted  . Mastalgia 06/11/2011  . 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 History of ectopic pregnancy 06/11/2011    Past Surgical History:  Procedure Laterality Date  . SALPINGECTOMY     removal of ectopic preg and tube    OB History    Gravida Para Term Preterm AB Living   1 0 0 0 1 0   SAB TAB Ectopic Multiple Live Births   0 0 1 0         Home Medications    Prior to Admission medications   Medication Sig Start Date End Date Taking? Authorizing Provider  acetaminophen (TYLENOL) 325 MG tablet Take 650 mg by mouth every 6 (six) hours as needed for moderate pain.    [provider]  megestrol (MEGACE) 40 MG tablet Take 3/day (at the same time) for 5 days; 2/day for 5 days, then 1/day PO prn bleeding 01/03/16   Marylene Land, CNM  multivitamin-iron-minerals-folic acid (CENTRUM) chewable tablet Chew 1 tablet by mouth daily. 01/03/16   Marylene Land, CNM      Family History Family History  Problem Relation Age of Onset  . Heart disease Father        CHF  . Diabetes Maternal Grandmother   . Hypertension Maternal Grandmother   . Cancer Maternal Grandmother        breast  . Cancer Paternal Grandmother   . Hypertension Mother   . Anesthesia problems Neg Hx   . Hypotension Neg Hx   . Malignant hyperthermia Neg Hx   . Pseudochol deficiency Neg Hx     Social History Social History  Substance Use Topics  . Smoking status: Never Smoker  . Smokeless tobacco: Never Used  . Alcohol use No     Allergies   Latex   Review of Systems Review of Systems  All other systems reviewed and are negative.    Physical Exam Updated Vital Signs BP (!) 146/95 (BP Location: Right Arm)   Pulse 89   Temp 98.3 F (36.8 C) (Oral)   Resp 18   Ht 5\' 6"  (1.676 m)   Wt (!) 137 kg (302 lb)   LMP 08/29/2016   SpO2 98%   BMI 48.74 kg/m   Physical Exam  Constitutional: She is oriented to person, place, and time. She appears well-developed and well-nourished.  HENT:  Head: Normocephalic  and atraumatic.  TMs are clear, mild congestion seen behind right TM  Emergent right lower rear wisdom tooth without any visible sign of infection  Eyes: Pupils are equal, round, and reactive to light. Conjunctivae and EOM are normal.  Neck: Normal range of motion. Neck supple.  Cardiovascular: Normal rate and regular rhythm.  Exam reveals no gallop and no friction rub.   No murmur heard. Pulmonary/Chest: Effort normal.  Abdominal: She exhibits no distension.  Musculoskeletal: Normal range of motion. She exhibits no edema or tenderness.  Neurological: She is alert and oriented to person, place, and time.  Skin: Skin is warm and dry.  Psychiatric: She has a normal mood and affect. Her behavior is normal. Judgment and thought content normal.  Nursing note and vitals reviewed.    ED Treatments / Results  Labs (all labs ordered are listed, but only abnormal  results are displayed) Labs Reviewed - No data to display  EKG  EKG Interpretation None       Radiology No results found.  Procedures Procedures (including critical care time)  Medications Ordered in ED Medications - No data to display   Initial Impression / Assessment and Plan / ED Course  I have reviewed the triage vital signs and the nursing notes.  Pertinent labs & imaging results that were available during my care of the patient were reviewed by me and considered in my medical decision making (see chart for details).    Patient with right ear pain.  Could be mild otitis media vs referred pain from emerging right lower wisdom tooth.  Will cover with amox.  Dental follow-up if no improvement.  Final Clinical Impressions(s) / ED Diagnoses   Final diagnoses:  Right ear pain    New Prescriptions New Prescriptions   AMOXICILLIN (AMOXIL) 500 MG CAPSULE    Take 1 capsule (500 mg total) by mouth 3 (three) times daily.     Roxy Horseman, PA-C 10/24/16 0111    Zadie Rhine, MD 10/24/16 [phone]

## 2016-10-24 NOTE — ED Notes (Signed)
Bed: WA22 Expected date:  Expected time:  Means of arrival:  Comments: 

## 2016-10-24 NOTE — ED Notes (Signed)
Bed: WA21 Expected date:  Expected time:  Means of arrival:  Comments: 

## 2017-01-01 ENCOUNTER — Telehealth: Payer: Self-pay | Admitting: *Deleted

## 2017-01-01 NOTE — Telephone Encounter (Signed)
Pt left message yesterday stating that she had a question regarding her medication.

## 2017-01-09 ENCOUNTER — Other Ambulatory Visit: Payer: Self-pay | Admitting: *Deleted

## 2017-01-09 MED ORDER — MEGESTROL ACETATE 40 MG PO TABS: ORAL_TABLET | ORAL | 0 refills | Status: DC

## 2017-01-09 NOTE — Telephone Encounter (Signed)
Spoke with patient via phone.  States she had 2 refills on megace for heavy irregular bleeding and they have expired.  Wants to know if we will phone in a refill or if she needs to be seen.  Told patient I would discuss with provider and call her back.  Spoke with Dr. Shawnie PonsPratt.  Phoned in megace for 30 days.  Phoned patient explained I had called in 1 month of megace for her and she would need an appointment to be seen before we can give any additional meds.  Appointment scheduled with Dr. Jolayne Pantheronstant who last saw patient in 2016.  Appointment on 02/06/17 at 1040 am.  Patient agreeable and states understanding.  Patient asks about copay.  Instructed patient to bring what she can even if its just a few dollars.  Patient states understanding.

## 2017-01-13 ENCOUNTER — Encounter (HOSPITAL_COMMUNITY): Payer: Self-pay

## 2017-01-13 ENCOUNTER — Emergency Department (HOSPITAL_COMMUNITY)
Admission: EM | Admit: 2017-01-13 | Discharge: 2017-01-13 | Disposition: A | Discharge status: Home / Self Care | Payer: Self-pay | Attending: Emergency Medicine | Admitting: Emergency Medicine

## 2017-01-13 ENCOUNTER — Other Ambulatory Visit: Payer: Self-pay

## 2017-01-13 DIAGNOSIS — R05 Cough: Secondary | ICD-10-CM | POA: Insufficient documentation

## 2017-01-13 DIAGNOSIS — R509 Fever, unspecified: Secondary | ICD-10-CM | POA: Insufficient documentation

## 2017-01-13 DIAGNOSIS — R059 Cough, unspecified: Secondary | ICD-10-CM

## 2017-01-13 DIAGNOSIS — J029 Acute pharyngitis, unspecified: Secondary | ICD-10-CM | POA: Insufficient documentation

## 2017-01-13 MED ORDER — PROMETHAZINE-DM 6.25-15 MG/5ML PO SYRP: 5.0000 mL | ORAL_SOLUTION | Freq: Four times a day (QID) | ORAL | 0 refills | Status: DC | PRN

## 2017-01-13 MED ORDER — BENZONATATE 100 MG PO CAPS: 100.0000 mg | ORAL_CAPSULE | Freq: Three times a day (TID) | ORAL | 0 refills | Status: DC

## 2017-01-13 NOTE — ED Triage Notes (Signed)
Patient c/o sore throat x 3-4 days, chills, body aches, and a productive cough with yellow sputum.

## 2017-01-13 NOTE — ED Provider Notes (Signed)
[facility]-EMERGENCY DEPT Provider Note   CSN: [id number] Arrival date & time: 01/13/17  1005     History   Chief Complaint Chief Complaint  Patient presents with  . flu symptoms    HPI Regina Watkins is a 31 y.o. female. Chief complaint is cough  HPI 31 year old female. Cough for the last 3-4 days. Feels "cold" has not checked temperature at home. No chest pain. Has sore throat. Painful cough and swallow. Occasional yellow sputum. No hemoptysis. Has not been vaccinated for flu.  Past Medical History:  Diagnosis Date  . Anemia   . BV (bacterial vaginosis)   . Ectopic pregnancy   . Infection    UTI  . No pertinent past medical history   . Obesity   . Strep throat   . Trichomonas     Patient Active Problem List   Diagnosis Date Noted  . Mastalgia 06/11/2011  . 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 History of ectopic pregnancy 06/11/2011    Past Surgical History:  Procedure Laterality Date  . SALPINGECTOMY     removal of ectopic preg and tube    OB History    Gravida Para Term Preterm AB Living   1 0 0 0 1 0   SAB TAB Ectopic Multiple Live Births   0 0 1 0         Home Medications    Prior to Admission medications   Medication Sig Start Date End Date Taking? Authorizing Provider  acetaminophen (TYLENOL) 325 MG tablet Take 650 mg by mouth every 6 (six) hours as needed for moderate pain.    [provider]  amoxicillin (AMOXIL) 500 MG capsule Take 1 capsule (500 mg total) by mouth 3 (three) times daily. 10/24/16   Roxy HorsemanBrowning, Robert, PA-C  benzonatate (TESSALON) 100 MG capsule Take 1 capsule (100 mg total) every 8 (eight) hours by mouth. 01/13/17   Rolland PorterJames, Virgil Slinger, MD  megestrol (MEGACE) 40 MG tablet 1 tab daily PO prn bleeding 01/09/17   Reva BoresPratt, Tanya S, MD  multivitamin-iron-minerals-folic acid (CENTRUM) chewable tablet Chew 1 tablet by mouth daily. Patient not taking: Reported on 10/24/2016 01/03/16   Marylene LandKooistra, Kathryn  Lorraine, CNM  promethazine-dextromethorphan (PROMETHAZINE-DM) 6.25-15 MG/5ML syrup Take 5 mLs 4 (four) times daily as needed by mouth for cough. 01/13/17   Rolland PorterJames, Ranulfo Kall, MD    Family History Family History  Problem Relation Age of Onset  . Heart disease Father        CHF  . Diabetes Maternal Grandmother   . Hypertension Maternal Grandmother   . Cancer Maternal Grandmother        breast  . Cancer Paternal Grandmother   . Hypertension Mother   . Anesthesia problems Neg Hx   . Hypotension Neg Hx   . Malignant hyperthermia Neg Hx   . Pseudochol deficiency Neg Hx     Social History Social History   Tobacco Use  . Smoking status: Never Smoker  . Smokeless tobacco: Never Used  Substance Use Topics  . Alcohol use: No  . Drug use: No     Allergies   Latex   Review of Systems Review of Systems  Constitutional: Positive for fever. Negative for appetite change, chills, diaphoresis and fatigue.  HENT: Positive for sore throat. Negative for mouth sores and trouble swallowing.   Eyes: Negative for visual disturbance.  Respiratory: Positive for cough. Negative for chest tightness, shortness of breath and wheezing.   Cardiovascular: Negative for chest pain.  Gastrointestinal:  Negative for abdominal distention, abdominal pain, diarrhea, nausea and vomiting.  Endocrine: Negative for polydipsia, polyphagia and polyuria.  Genitourinary: Negative for dysuria, frequency and hematuria.  Musculoskeletal: Negative for gait problem.  Skin: Negative for color change, pallor and rash.  Neurological: Negative for dizziness, syncope, light-headedness and headaches.  Hematological: Does not bruise/bleed easily.  Psychiatric/Behavioral: Negative for behavioral problems and confusion.     Physical Exam Updated Vital Signs BP (!) 145/88   Pulse 80   Temp 98.8 F (37.1 C) (Oral)   Resp 16   Ht 5\' 6"  (1.676 m)   Wt 136.1 kg (300 lb)   SpO2 100%   BMI 48.42 kg/m   Physical Exam    Constitutional: She is oriented to person, place, and time. She appears well-developed and well-nourished. No distress.  HENT:  Head: Normocephalic.  Normal pharynx. No erythema. No exudate. Normal tongue without strawberry. No palate petechiae. No fever on recheck oral temperature. No adenopathy anteriorly or posteriorly in the neck. Centor score 0.  Eyes: Conjunctivae are normal. Pupils are equal, round, and reactive to light. No scleral icterus.  Neck: Normal range of motion. Neck supple. No thyromegaly present.  Cardiovascular: Normal rate and regular rhythm. Exam reveals no gallop and no friction rub.  No murmur heard. Pulmonary/Chest: Effort normal and breath sounds normal. No respiratory distress. She has no wheezes. She has no rales.  Clear lungs. No increased worker breathing or abnormal breath sounds. 98% saturations.  Abdominal: Soft. Bowel sounds are normal. She exhibits no distension. There is no tenderness. There is no rebound.  Musculoskeletal: Normal range of motion.  Neurological: She is alert and oriented to person, place, and time.  Skin: Skin is warm and dry. No rash noted.  Psychiatric: She has a normal mood and affect. Her behavior is normal.     ED Treatments / Results  Labs (all labs ordered are listed, but only abnormal results are displayed) Labs Reviewed - No data to display  EKG  EKG Interpretation None       Radiology No results found.  Procedures Procedures (including critical care time)  Medications Ordered in ED Medications - No data to display   Initial Impression / Assessment and Plan / ED Course  I have reviewed the triage vital signs and the nursing notes.  Pertinent labs & imaging results that were available during my care of the patient were reviewed by me and considered in my medical decision making (see chart for details).   palpable viral syndrome. No symptoms or findings to suggest pneumonia. Since back for 3 days. Out of window  for treatment if influenza. Low Centor score. No indication for strep testing. Plan cough suppressants. Expectant management.  Final Clinical Impressions(s) / ED Diagnoses   Final diagnoses:  Cough    ED Discharge Orders        Ordered    benzonatate (TESSALON) 100 MG capsule  Every 8 hours     01/13/17 1038    promethazine-dextromethorphan (PROMETHAZINE-DM) 6.25-15 MG/5ML syrup  4 times daily PRN     01/13/17 1038       Rolland PorterJames, Klayton Monie, MD 01/13/17 1041

## 2017-01-13 NOTE — Discharge Instructions (Signed)
Tessalon for cough during the day. Phenergan-DM for nighttime cough.

## 2017-02-06 ENCOUNTER — Ambulatory Visit: Payer: Self-pay | Admitting: Obstetrics and Gynecology

## 2017-02-28 ENCOUNTER — Inpatient Hospital Stay (HOSPITAL_COMMUNITY)
Admission: AD | Admit: 2017-02-28 | Discharge: 2017-03-01 | Disposition: A | Discharge status: Home / Self Care | Payer: Self-pay | Source: Ambulatory Visit | Attending: Obstetrics & Gynecology | Admitting: Obstetrics & Gynecology

## 2017-02-28 DIAGNOSIS — Z79899 Other long term (current) drug therapy: Secondary | ICD-10-CM | POA: Insufficient documentation

## 2017-02-28 DIAGNOSIS — Z803 Family history of malignant neoplasm of breast: Secondary | ICD-10-CM | POA: Insufficient documentation

## 2017-02-28 DIAGNOSIS — N898 Other specified noninflammatory disorders of vagina: Secondary | ICD-10-CM | POA: Insufficient documentation

## 2017-02-28 DIAGNOSIS — E669 Obesity, unspecified: Secondary | ICD-10-CM | POA: Insufficient documentation

## 2017-02-28 DIAGNOSIS — Z91040 Latex allergy status: Secondary | ICD-10-CM | POA: Insufficient documentation

## 2017-02-28 DIAGNOSIS — N939 Abnormal uterine and vaginal bleeding, unspecified: Secondary | ICD-10-CM | POA: Insufficient documentation

## 2017-02-28 DIAGNOSIS — Z8249 Family history of ischemic heart disease and other diseases of the circulatory system: Secondary | ICD-10-CM | POA: Insufficient documentation

## 2017-02-28 DIAGNOSIS — Z9889 Other specified postprocedural states: Secondary | ICD-10-CM | POA: Insufficient documentation

## 2017-02-28 DIAGNOSIS — Z833 Family history of diabetes mellitus: Secondary | ICD-10-CM | POA: Insufficient documentation

## 2017-03-01 ENCOUNTER — Encounter (HOSPITAL_COMMUNITY): Payer: Self-pay | Admitting: *Deleted

## 2017-03-01 ENCOUNTER — Other Ambulatory Visit: Payer: Self-pay

## 2017-03-01 DIAGNOSIS — N939 Abnormal uterine and vaginal bleeding, unspecified: Secondary | ICD-10-CM

## 2017-03-01 LAB — URINALYSIS, ROUTINE W REFLEX MICROSCOPIC
Bilirubin Urine: NEGATIVE
Glucose, UA: NEGATIVE mg/dL
Ketones, ur: NEGATIVE mg/dL
Nitrite: NEGATIVE
Protein, ur: NEGATIVE mg/dL
Specific Gravity, Urine: 1.016 (ref 1.005–1.030)
pH: 5 (ref 5.0–8.0)

## 2017-03-01 LAB — WET PREP, GENITAL
Sperm: NONE SEEN
Trich, Wet Prep: NONE SEEN
Yeast Wet Prep HPF POC: NONE SEEN

## 2017-03-01 LAB — CBC
HCT: 35.1 % — ABNORMAL LOW (ref 36.0–46.0)
Hemoglobin: 11.7 g/dL — ABNORMAL LOW (ref 12.0–15.0)
MCH: 25.8 pg — ABNORMAL LOW (ref 26.0–34.0)
MCHC: 33.3 g/dL (ref 30.0–36.0)
MCV: 77.3 fL — ABNORMAL LOW (ref 78.0–100.0)
Platelets: 354 K/uL (ref 150–400)
RBC: 4.54 MIL/uL (ref 3.87–5.11)
RDW: 15.1 % (ref 11.5–15.5)
WBC: 10.7 K/uL — ABNORMAL HIGH (ref 4.0–10.5)

## 2017-03-01 LAB — POCT PREGNANCY, URINE: Preg Test, Ur: NEGATIVE

## 2017-03-01 MED ORDER — MEGESTROL ACETATE 40 MG PO TABS: 120.0000 mg | ORAL_TABLET | Freq: Every day | ORAL | 3 refills | Status: DC

## 2017-03-01 NOTE — MAU Note (Signed)
Having pressure in lower abd and light bleeding. No period for few months and then started bleeding on 02/01/17 and continues bleeding. Bleeding heavy until 15th and now lighter with mostly spotting. Fatigue

## 2017-03-01 NOTE — Discharge Instructions (Signed)
In late 2019, the [facility] will be moving to the [place] campus. At that time, the MAU (Maternity Admissions Unit), where you are being seen today, will no longer take care of non-pregnant patients. We strongly encourage you to find a doctor's office before that time, so that you can be seen with any GYN concerns, like vaginal discharge, urinary tract infection, etc.. in a timely manner. ° °In order to make an office visit more convenient, the [facility] will be offering evening hours with same-day appointments, walk-in appointments and scheduled appointments available during this time. ° °[facility] Hours: °Monday - 8am - 7:30 pm with walk-in between 4pm- 7:30 pm °Tuesday - 8 am - 5 pm (starting 06/03/17 we will be open late and accepting walk-ins from 4pm - 7:30pm) °Wednesday - 8 am - 5 pm (starting 09/03/17 we will be open late and accepting walk-ins from 4pm - 7:30pm) °Thursday 8 am - 5 pm (starting 12/04/17 we will be open late and accepting walk-ins from 4pm - 7:30pm) °Friday 8 am - 5 pm ° °For an appointment please call the [facility] at [phone] ° °For urgent needs, [facility] is also available for management of urgent GYN complaints such as vaginal discharge or urinary tract infections. ° ° ° ° °Primary care follow up  °[facility] (will see you even if you do not have sickle cell): [phone] °[facility]: [phone] °[facility]: [phone] ° ° ° °Abnormal Uterine Bleeding °Abnormal uterine bleeding means bleeding more than usual from your uterus. It can include: °· Bleeding between periods. °· Bleeding after sex. °· Bleeding that is heavier than normal. °· Periods that last longer than usual. °· Bleeding after you have stopped having your period (menopause). ° °There are many problems that may cause this. You should see a  doctor for any kind of bleeding that is not normal. Treatment depends on the cause of the bleeding. °Follow these instructions at home: °· Watch your condition for any changes. °· Do not use tampons, douche, or have sex, if your doctor tells you not to. °· Change your pads often. °· Get regular well-woman exams. Make sure they include a pelvic exam and cervical cancer screening. °· Keep all follow-up visits as told by your doctor. This is important. °Contact a doctor if: °· The bleeding lasts more than one week. °· You feel dizzy at times. °· You feel like you are going to throw up (nauseous). °· You throw up. °Get help right away if: °· You pass out. °· You have to change pads every hour. °· You have belly (abdominal) pain. °· You have a fever. °· You get sweaty. °· You get weak. °· You passing large blood clots from your vagina. °Summary °· Abnormal uterine bleeding means bleeding more than usual from your uterus. °· There are many problems that may cause this. You should see a doctor for any kind of bleeding that is not normal. °· Treatment depends on the cause of the bleeding. °This information is not intended to replace advice given to you by your health care provider. Make sure you discuss any questions you have with your health care provider. °Document Released: 12/16/2008 Document Revised: 02/13/2016 Document Reviewed: 02/13/2016 °Elsevier Interactive Patient Education © 2017 [employer] ° °

## 2017-03-01 NOTE — MAU Provider Note (Signed)
History     CSN: [id number]  Arrival date and time: 02/28/17 2322   First Provider Initiated Contact with Patient 03/01/17 0143      Chief Complaint  Patient presents with  . Vaginal Bleeding   Regina Watkins is a 31 y.o. G1P0010 who presents today with vaginal bleeding. She states that she did not have a period for 2 months, and then on 02/01/17 she started bleeding. She has been bleeding since. Gradually improving. She has been on megace in the past for abnormal uterine bleeding. She was given a refill in 01/2017. She has not been taking it. She states that it wasn't working this time. She was given one pill per day. In the past she was on 3 pills per day, and that always worked. Patient states that she has had this bleeding off and on for years, and "no one can ever tell me why. They just tell me that I can take megace or birth control pills. I would like a hysterectomy". Of note, patient did not show for her last appointment in the clinic to be evaluated for this problem.    Vaginal Bleeding  The patient's primary symptoms include vaginal bleeding. The patient's pertinent negatives include no pelvic pain. This is a new problem. The current episode started 1 to 4 weeks ago. The problem occurs constantly. The problem has been gradually improving. Pain severity now: I was having some pain/pressure, but none since.  She is not pregnant. Pertinent negatives include no chills, dysuria, fever, frequency, nausea or vomiting. The vaginal discharge was bloody. The vaginal bleeding is spotting. She has not been passing clots. She has not been passing tissue. Nothing aggravates the symptoms. She has tried nothing for the symptoms. She uses nothing for contraception. Her menstrual history has been irregular.    Past Medical History:  Diagnosis Date  . Anemia   . BV (bacterial vaginosis)   . Ectopic pregnancy   . Infection    UTI  . No pertinent past medical history   . Obesity   . Strep  throat   . Trichomonas     Past Surgical History:  Procedure Laterality Date  . SALPINGECTOMY     removal of ectopic preg and tube    Family History  Problem Relation Age of Onset  . Heart disease Father        CHF  . Diabetes Maternal Grandmother   . Hypertension Maternal Grandmother   . Cancer Maternal Grandmother        breast  . Cancer Paternal Grandmother   . Hypertension Mother   . Anesthesia problems Neg Hx   . Hypotension Neg Hx   . Malignant hyperthermia Neg Hx   . Pseudochol deficiency Neg Hx     Social History   Tobacco Use  . Smoking status: Never Smoker  . Smokeless tobacco: Never Used  Substance Use Topics  . Alcohol use: No  . Drug use: No    Allergies:  Allergies  Allergen Reactions  . Latex Other (See Comments)    Latex condoms cause bacterial infections and irritation.    Medications Prior to Admission  Medication Sig Dispense Refill Last Dose  . acetaminophen (TYLENOL) 325 MG tablet Take 650 mg by mouth every 6 (six) hours as needed for moderate pain.   Past Month at Unknown time  . megestrol (MEGACE) 40 MG tablet 1 tab daily PO prn bleeding 30 tablet 0 Past Week at Unknown time  . amoxicillin (AMOXIL) 500  MG capsule Take 1 capsule (500 mg total) by mouth 3 (three) times daily. 21 capsule 0   . benzonatate (TESSALON) 100 MG capsule Take 1 capsule (100 mg total) every 8 (eight) hours by mouth. 21 capsule 0   . benzonatate (TESSALON) 100 MG capsule Take 1 capsule (100 mg total) every 8 (eight) hours by mouth. 21 capsule 0   . benzonatate (TESSALON) 100 MG capsule Take 1 capsule (100 mg total) every 8 (eight) hours by mouth. 21 capsule 0   . multivitamin-iron-minerals-folic acid (CENTRUM) chewable tablet Chew 1 tablet by mouth daily. (Patient not taking: Reported on 10/24/2016) 30 tablet 2 Not Taking at Unknown time  . promethazine-dextromethorphan (PROMETHAZINE-DM) 6.25-15 MG/5ML syrup Take 5 mLs 4 (four) times daily as needed by mouth for cough.  120 mL 0   . promethazine-dextromethorphan (PROMETHAZINE-DM) 6.25-15 MG/5ML syrup Take 5 mLs 4 (four) times daily as needed by mouth for cough. 120 mL 0   . promethazine-dextromethorphan (PROMETHAZINE-DM) 6.25-15 MG/5ML syrup Take 5 mLs 4 (four) times daily as needed by mouth for cough. 120 mL 0     Review of Systems  Constitutional: Negative for chills and fever.  Gastrointestinal: Negative for nausea and vomiting.  Genitourinary: Positive for vaginal bleeding. Negative for dysuria, frequency and pelvic pain.   Physical Exam   Blood pressure 133/83, pulse 88, temperature 98.5 F (36.9 C), resp. rate 18, height 5\' 6"  (1.676 m), weight (!) 306 lb (138.8 kg), last menstrual period 02/01/2017.  Physical Exam  Nursing note and vitals reviewed. Constitutional: She is oriented to person, place, and time. She appears well-developed and well-nourished. No distress.  HENT:  Head: Normocephalic.  Cardiovascular: Normal rate.  Respiratory: Effort normal.  GI: Soft. There is no tenderness. There is no rebound.  Neurological: She is alert and oriented to person, place, and time.  Skin: Skin is warm and dry.  Psychiatric: She has a normal mood and affect.   Results for orders placed or performed during the hospital encounter of 02/28/17 (from the past 24 hour(s))  Urinalysis, Routine w reflex microscopic     Status: Abnormal   Collection Time: 03/01/17 12:25 AM  Result Value Ref Range   Color, Urine YELLOW YELLOW   APPearance HAZY (A) CLEAR   Specific Gravity, Urine 1.016 1.005 - 1.030   pH 5.0 5.0 - 8.0   Glucose, UA NEGATIVE NEGATIVE mg/dL   Hgb urine dipstick MODERATE (A) NEGATIVE   Bilirubin Urine NEGATIVE NEGATIVE   Ketones, ur NEGATIVE NEGATIVE mg/dL   Protein, ur NEGATIVE NEGATIVE mg/dL   Nitrite NEGATIVE NEGATIVE   Leukocytes, UA TRACE (A) NEGATIVE   RBC / HPF 0-5 0 - 5 RBC/hpf   WBC, UA 0-5 0 - 5 WBC/hpf   Bacteria, UA RARE (A) NONE SEEN   Squamous Epithelial / LPF 0-5 (A)  NONE SEEN   Mucus PRESENT   Pregnancy, urine POC     Status: None   Collection Time: 03/01/17 12:37 AM  Result Value Ref Range   Preg Test, Ur NEGATIVE NEGATIVE  CBC     Status: Abnormal   Collection Time: 03/01/17  1:18 AM  Result Value Ref Range   WBC 10.7 (H) 4.0 - 10.5 K/uL   RBC 4.54 3.87 - 5.11 MIL/uL   Hemoglobin 11.7 (L) 12.0 - 15.0 g/dL   HCT 16.1 (L) 09.6 - 04.5 %   MCV 77.3 (L) 78.0 - 100.0 fL   MCH 25.8 (L) 26.0 - 34.0 pg   MCHC 33.3 30.0 -  36.0 g/dL   RDW 16.115.1 09.611.5 - 04.515.5 %   Platelets 354 150 - 400 K/uL  Wet prep, genital     Status: Abnormal   Collection Time: 03/01/17  1:55 AM  Result Value Ref Range   Yeast Wet Prep HPF POC NONE SEEN NONE SEEN   Trich, Wet Prep NONE SEEN NONE SEEN   Clue Cells Wet Prep HPF POC PRESENT (A) NONE SEEN   WBC, Wet Prep HPF POC MODERATE (A) NONE SEEN   Sperm NONE SEEN     MAU Course  Procedures  MDM   Assessment and Plan   1. Abnormal uterine bleeding    DC home Comfort measures reviewed  Bleeding precautions RX: megace 3 tabs q day #90 with 3 RF Return to MAU as needed FU with OB as planned  Follow-up Information    [facility]   Specialty:  Obstetrics and Gynecology Why:  Please call for an appointment to be seen in the clinic  Contact information: [address] [place] [id number] [phone]           Thressa ShellerHeather Hogan 03/01/2017, 2:50 AM

## 2017-03-01 NOTE — Progress Notes (Signed)
Written and verbal d/c instructions given and understanding voiced. 

## 2017-03-03 LAB — GC/CHLAMYDIA PROBE AMP (~~LOC~~) NOT AT ARMC
CHLAMYDIA, DNA PROBE: NEGATIVE
NEISSERIA GONORRHEA: NEGATIVE

## 2017-11-17 ENCOUNTER — Encounter (HOSPITAL_COMMUNITY): Payer: Self-pay | Admitting: *Deleted

## 2017-11-17 ENCOUNTER — Inpatient Hospital Stay (HOSPITAL_COMMUNITY)
Admission: AD | Admit: 2017-11-17 | Discharge: 2017-11-18 | Disposition: A | Discharge status: Home / Self Care | Payer: Self-pay | Attending: Obstetrics and Gynecology | Admitting: Obstetrics and Gynecology

## 2017-11-17 DIAGNOSIS — N3000 Acute cystitis without hematuria: Secondary | ICD-10-CM

## 2017-11-17 DIAGNOSIS — N760 Acute vaginitis: Secondary | ICD-10-CM | POA: Insufficient documentation

## 2017-11-17 DIAGNOSIS — B9689 Other specified bacterial agents as the cause of diseases classified elsewhere: Secondary | ICD-10-CM | POA: Insufficient documentation

## 2017-11-17 DIAGNOSIS — Z79899 Other long term (current) drug therapy: Secondary | ICD-10-CM | POA: Insufficient documentation

## 2017-11-17 DIAGNOSIS — N390 Urinary tract infection, site not specified: Secondary | ICD-10-CM | POA: Insufficient documentation

## 2017-11-17 LAB — URINALYSIS, ROUTINE W REFLEX MICROSCOPIC
BILIRUBIN URINE: NEGATIVE
Glucose, UA: NEGATIVE mg/dL
Ketones, ur: NEGATIVE mg/dL
Nitrite: NEGATIVE
Protein, ur: NEGATIVE mg/dL
SPECIFIC GRAVITY, URINE: 1.013 (ref 1.005–1.030)
WBC, UA: >50 WBC/hpf — ABNORMAL HIGH (ref 0–5)
pH: 5 (ref 5.0–8.0)

## 2017-11-17 LAB — POCT PREGNANCY, URINE: PREG TEST UR: NEGATIVE

## 2017-11-17 NOTE — MAU Note (Signed)
Pt reports abdominal pain, vomiting and discomfort with urination. States she threw up twice today. States these symptoms started about 1 week ago. Pt thinks she may have a UTI. Denies discharge or vaginal bleeding, but feels irritated. Denies fever. LMP was in May.

## 2017-11-18 DIAGNOSIS — N760 Acute vaginitis: Secondary | ICD-10-CM

## 2017-11-18 DIAGNOSIS — B9689 Other specified bacterial agents as the cause of diseases classified elsewhere: Secondary | ICD-10-CM

## 2017-11-18 LAB — WET PREP, GENITAL
SPERM: NONE SEEN
TRICH WET PREP: NONE SEEN
YEAST WET PREP: NONE SEEN

## 2017-11-18 MED ORDER — METRONIDAZOLE 500 MG PO TABS: 500.0000 mg | ORAL_TABLET | Freq: Two times a day (BID) | ORAL | 0 refills | Status: DC

## 2017-11-18 MED ORDER — LACTOBACILLUS PO TABS: 1.0000 | ORAL_TABLET | Freq: Every day | ORAL | 0 refills | Status: DC

## 2017-11-18 MED ORDER — SULFAMETHOXAZOLE-TRIMETHOPRIM 800-160 MG PO TABS: 1.0000 | ORAL_TABLET | Freq: Two times a day (BID) | ORAL | 0 refills | Status: AC

## 2017-11-18 NOTE — MAU Provider Note (Signed)
History     CSN: [id number]  Arrival date and time: 11/17/17 2253   First Provider Initiated Contact with Patient 11/18/17 0009      Chief Complaint  Patient presents with  . Abdominal Pain  . Emesis   HPI Regina Watkins is a 32 y.o. G1P0010 non pregnant patient who presents to MAU with concern for yeast infection. Patient endorses suprapubic pressure with voiding and malodorous vaginal discharge. Denies sexual intercourse, recent illness, fever.  Pertinent Gynecological History: Menses: flow is moderate Bleeding: regular Contraception: condoms DES exposure: denies Blood transfusions: none Sexually transmitted diseases: currently at risk Previous GYN Procedures: Denies  Last mammogram: N/A age 21  Last pap: normal Date: 10/21/2013   Past Medical History:  Diagnosis Date  . Anemia   . BV (bacterial vaginosis)   . Ectopic pregnancy   . Infection    UTI  . No pertinent past medical history   . Obesity   . Strep throat   . Trichomonas     Past Surgical History:  Procedure Laterality Date  . SALPINGECTOMY     removal of ectopic preg and tube    Family History  Problem Relation Age of Onset  . Heart disease Father        CHF  . Diabetes Maternal Grandmother   . Hypertension Maternal Grandmother   . Cancer Maternal Grandmother        breast  . Cancer Paternal Grandmother   . Hypertension Mother   . Anesthesia problems Neg Hx   . Hypotension Neg Hx   . Malignant hyperthermia Neg Hx   . Pseudochol deficiency Neg Hx     Social History   Tobacco Use  . Smoking status: Never Smoker  . Smokeless tobacco: Never Used  Substance Use Topics  . Alcohol use: No  . Drug use: No    Allergies:  Allergies  Allergen Reactions  . Latex Other (See Comments)    Latex condoms cause bacterial infections and irritation.    Medications Prior to Admission  Medication Sig Dispense Refill Last Dose  . acetaminophen (TYLENOL) 325 MG tablet Take 650 mg by mouth  every 6 (six) hours as needed for moderate pain.   More than a month at Unknown time  . megestrol (MEGACE) 40 MG tablet Take 3 tablets (120 mg total) by mouth daily. 90 tablet 3 More than a month at Unknown time  . multivitamin-iron-minerals-folic acid (CENTRUM) chewable tablet Chew 1 tablet by mouth daily. (Patient not taking: Reported on 10/24/2016) 30 tablet 2 More than a month at Unknown time    Review of Systems  Constitutional: Negative for fever.  Gastrointestinal: Positive for abdominal pain.  Genitourinary: Positive for vaginal discharge.  All other systems reviewed and are negative.  Physical Exam   Blood pressure 126/60, pulse 94, temperature 99.1 F (37.3 C), resp. rate 19, height 5\' 6"  (1.676 m), weight 135.6 kg, last menstrual period 10/16/2017, SpO2 99 %.  Physical Exam  Nursing note and vitals reviewed. Constitutional: She is oriented to person, place, and time. She appears well-developed and well-nourished.  Cardiovascular: Normal rate.  Respiratory: Effort normal.  GI: Soft.  Genitourinary:  Genitourinary Comments: Thin white foul smelling vaginal discharge noted during swab collection  Neurological: She is alert and oriented to person, place, and time. She has normal reflexes.  Skin: Skin is warm and dry.  Psychiatric: She has a normal mood and affect. Her behavior is normal. Judgment and thought content normal.    MAU  Course  Procedures  MDM Patient Vitals for the past 24 hrs:  BP Temp Pulse Resp SpO2 Height Weight  11/17/17 2310 126/60 99.1 F (37.3 C) 94 19 99 % 5\' 6"  (1.676 m) 135.6 kg    Orders Placed This Encounter  Procedures  . Wet prep, genital    Standing Status:   Standing    Number of Occurrences:   1  . Urinalysis, Routine w reflex microscopic    Standing Status:   Standing    Number of Occurrences:   1  . Pregnancy, urine POC    Standing Status:   Standing    Number of Occurrences:   1  . Discharge patient    Order Specific Question:    Discharge disposition    Answer:   01-Home or Self Care [1]    Order Specific Question:   Discharge patient date    Answer:   11/18/2017   Results for orders placed or performed during the hospital encounter of 11/17/17 (from the past 24 hour(s))  Urinalysis, Routine w reflex microscopic     Status: Abnormal   Collection Time: 11/17/17 11:34 PM  Result Value Ref Range   Color, Urine YELLOW YELLOW   APPearance HAZY (A) CLEAR   Specific Gravity, Urine 1.013 1.005 - 1.030   pH 5.0 5.0 - 8.0   Glucose, UA NEGATIVE NEGATIVE mg/dL   Hgb urine dipstick MODERATE (A) NEGATIVE   Bilirubin Urine NEGATIVE NEGATIVE   Ketones, ur NEGATIVE NEGATIVE mg/dL   Protein, ur NEGATIVE NEGATIVE mg/dL   Nitrite NEGATIVE NEGATIVE   Leukocytes, UA MODERATE (A) NEGATIVE   RBC / HPF 21-50 0 - 5 RBC/hpf   WBC, UA >50 (H) 0 - 5 WBC/hpf   Bacteria, UA RARE (A) NONE SEEN   Squamous Epithelial / LPF 0-5 0 - 5   WBC Clumps PRESENT    Mucus PRESENT   Pregnancy, urine POC     Status: None   Collection Time: 11/17/17 11:40 PM  Result Value Ref Range   Preg Test, Ur NEGATIVE NEGATIVE  Wet prep, genital     Status: Abnormal   Collection Time: 11/17/17 11:54 PM  Result Value Ref Range   Yeast Wet Prep HPF POC NONE SEEN NONE SEEN   Trich, Wet Prep NONE SEEN NONE SEEN   Clue Cells Wet Prep HPF POC PRESENT (A) NONE SEEN   WBC, Wet Prep HPF POC MODERATE (A) NONE SEEN   Sperm NONE SEEN    Meds ordered this encounter  Medications  . sulfamethoxazole-trimethoprim (BACTRIM DS,SEPTRA DS) 800-160 MG tablet    Sig: Take 1 tablet by mouth 2 (two) times daily for 7 days.    Dispense:  14 tablet    Refill:  0    Order Specific Question:   Supervising Provider    Answer:   Reva Bores [[id number]]  . metroNIDAZOLE (FLAGYL) 500 MG tablet    Sig: Take 1 tablet (500 mg total) by mouth 2 (two) times daily.    Dispense:  14 tablet    Refill:  0    Order Specific Question:   Supervising Provider    Answer:   Reva Bores [[id number]]   . Lactobacillus TABS    Sig: Take 1 tablet by mouth at bedtime.    Dispense:  30 tablet    Refill:  0    Order Specific Question:   Supervising Provider    Answer:   Samara Snide  Assessment and Plan  --32 y.o. non pregnant patient  --UTI, Bacterial Vaginosis, rx as described above --Discharge home in stable condition  Calvert CantorSamantha C Daxx Tiggs, CNM 11/18/2017, 12:31 AM

## 2017-11-18 NOTE — Discharge Instructions (Signed)

## 2017-11-19 LAB — GC/CHLAMYDIA PROBE AMP (~~LOC~~) NOT AT ARMC
CHLAMYDIA, DNA PROBE: NEGATIVE
Neisseria Gonorrhea: NEGATIVE

## 2017-12-17 ENCOUNTER — Telehealth: Payer: Self-pay | Admitting: Advanced Practice Midwife

## 2017-12-17 NOTE — Telephone Encounter (Signed)
No answer

## 2017-12-18 NOTE — Telephone Encounter (Signed)
Returned pt call regarding test results.  No answer.

## 2018-04-27 ENCOUNTER — Emergency Department (HOSPITAL_COMMUNITY): Payer: Self-pay

## 2018-04-27 ENCOUNTER — Other Ambulatory Visit: Payer: Self-pay

## 2018-04-27 ENCOUNTER — Emergency Department (HOSPITAL_COMMUNITY)
Admission: EM | Admit: 2018-04-27 | Discharge: 2018-04-28 | Disposition: A | Discharge status: Home / Self Care | Payer: Self-pay | Attending: Emergency Medicine | Admitting: Emergency Medicine

## 2018-04-27 ENCOUNTER — Encounter (HOSPITAL_COMMUNITY): Payer: Self-pay | Admitting: *Deleted

## 2018-04-27 DIAGNOSIS — R002 Palpitations: Secondary | ICD-10-CM | POA: Insufficient documentation

## 2018-04-27 DIAGNOSIS — R0981 Nasal congestion: Secondary | ICD-10-CM | POA: Insufficient documentation

## 2018-04-27 DIAGNOSIS — Z91040 Latex allergy status: Secondary | ICD-10-CM | POA: Insufficient documentation

## 2018-04-27 DIAGNOSIS — Z79899 Other long term (current) drug therapy: Secondary | ICD-10-CM | POA: Insufficient documentation

## 2018-04-27 DIAGNOSIS — H538 Other visual disturbances: Secondary | ICD-10-CM | POA: Insufficient documentation

## 2018-04-27 DIAGNOSIS — H9201 Otalgia, right ear: Secondary | ICD-10-CM | POA: Insufficient documentation

## 2018-04-27 DIAGNOSIS — G43009 Migraine without aura, not intractable, without status migrainosus: Secondary | ICD-10-CM | POA: Insufficient documentation

## 2018-04-27 LAB — BASIC METABOLIC PANEL
Anion gap: 7 (ref 5–15)
BUN: 9 mg/dL (ref 6–20)
CALCIUM: 9 mg/dL (ref 8.9–10.3)
CO2: 27 mmol/L (ref 22–32)
CREATININE: 0.63 mg/dL (ref 0.44–1.00)
Chloride: 105 mmol/L (ref 98–111)
GFR calc non Af Amer: >60 mL/min (ref >60)
Glucose, Bld: 125 mg/dL — ABNORMAL HIGH (ref 70–99)
Potassium: 3.7 mmol/L (ref 3.5–5.1)
SODIUM: 139 mmol/L (ref 135–145)

## 2018-04-27 LAB — CBC
HCT: 37.5 % (ref 36.0–46.0)
Hemoglobin: 12.1 g/dL (ref 12.0–15.0)
MCH: 26.4 pg (ref 26.0–34.0)
MCHC: 32.3 g/dL (ref 30.0–36.0)
MCV: 81.7 fL (ref 80.0–100.0)
NRBC: 0 % (ref 0.0–0.2)
PLATELETS: 336 10*3/uL (ref 150–400)
RBC: 4.59 MIL/uL (ref 3.87–5.11)
RDW: 15 % (ref 11.5–15.5)
WBC: 10.9 10*3/uL — AB (ref 4.0–10.5)

## 2018-04-27 LAB — POCT I-STAT TROPONIN I: Troponin i, poc: 0 ng/mL (ref 0.00–0.08)

## 2018-04-27 LAB — I-STAT BETA HCG BLOOD, ED (NOT ORDERABLE): I-stat hCG, quantitative: <5 m[IU]/mL (ref <5)

## 2018-04-27 LAB — MAGNESIUM: Magnesium: 1.9 mg/dL (ref 1.7–2.4)

## 2018-04-27 MED ORDER — DIPHENHYDRAMINE HCL 50 MG/ML IJ SOLN
25.0000 mg | Freq: Once | INTRAMUSCULAR | Status: AC
  Administered 2018-04-27: 25 mg via INTRAVENOUS
  Filled 2018-04-27: qty 1

## 2018-04-27 MED ORDER — METOCLOPRAMIDE HCL 5 MG/ML IJ SOLN
10.0000 mg | Freq: Once | INTRAMUSCULAR | Status: AC
  Administered 2018-04-27: 10 mg via INTRAVENOUS
  Filled 2018-04-27: qty 2

## 2018-04-27 MED ORDER — KETOROLAC TROMETHAMINE 30 MG/ML IJ SOLN
30.0000 mg | Freq: Once | INTRAMUSCULAR | Status: AC
  Administered 2018-04-27: 30 mg via INTRAVENOUS
  Filled 2018-04-27: qty 1

## 2018-04-27 MED ORDER — SODIUM CHLORIDE 0.9% FLUSH: 3.0000 mL | Freq: Once | INTRAVENOUS | Status: DC

## 2018-04-27 MED ORDER — SODIUM CHLORIDE 0.9 % IV BOLUS (SEPSIS)
1000.0000 mL | Freq: Once | INTRAVENOUS | Status: AC
  Administered 2018-04-28: 1000 mL via INTRAVENOUS

## 2018-04-27 NOTE — ED Notes (Signed)
Phlebotomy collection unsuccesful x2. Attempts made in L Hand and L Antecubital. No viable sites noted on R arm. Will notify RN and refer to Lab or IV team.

## 2018-04-27 NOTE — ED Triage Notes (Signed)
Pt reports 2-3 days of midsterrnal chest discomfort, head ache, blurry vision, Rt ear and nasal discomfort. She list off additional symptoms of waking with fast HR, dizziness.

## 2018-04-27 NOTE — ED Provider Notes (Signed)
TIME SEEN: 11:31 PM  CHIEF COMPLAINT: Multiple complaints  HPI: Patient is a 33 year old female with history of migraine headaches who presents to the emergency department with multiple complaints.  Complains of a right-sided throbbing headache with photophobia for the past 3 weeks.  Headache improved with ibuprofen but never resolves.  Has some blurry vision which is typical with her headaches.  No vision loss.  States she has had similar headaches before but has never come to the emergency department for treatment.  No numbness, tingling or focal weakness.  Patient also complaining of nasal congestion and right ear pain for the past several days.  No fever or cough.  No sore throat.  Patient also complaining of feeling like her heart is racing whenever she goes to bed and when she wakes up in the morning.  Denies chest pain or shortness of breath.  No history of PE or DVT.  ROS: See HPI Constitutional: no fever  Eyes: no drainage  ENT: no runny nose   Cardiovascular:  no chest pain  Resp: no SOB  GI: no vomiting GU: no dysuria Integumentary: no rash  Allergy: no hives  Musculoskeletal: no leg swelling  Neurological: no slurred speech ROS otherwise negative  PAST MEDICAL HISTORY/PAST SURGICAL HISTORY:  Past Medical History:  Diagnosis Date  . Anemia   . BV (bacterial vaginosis)   . Ectopic pregnancy   . Infection    UTI  . No pertinent past medical history   . Obesity   . Strep throat   . Trichomonas     MEDICATIONS:  Prior to Admission medications   Medication Sig Start Date End Date Taking? Authorizing Provider  acetaminophen (TYLENOL) 325 MG tablet Take 650 mg by mouth every 6 (six) hours as needed for moderate pain.    [provider]  Lactobacillus TABS Take 1 tablet by mouth at bedtime. 11/18/17   Calvert Cantor, CNM  megestrol (MEGACE) 40 MG tablet Take 3 tablets (120 mg total) by mouth daily. 03/01/17   Armando Reichert, CNM  metroNIDAZOLE (FLAGYL)  500 MG tablet Take 1 tablet (500 mg total) by mouth 2 (two) times daily. 11/18/17   Calvert Cantor, CNM  multivitamin-iron-minerals-folic acid (CENTRUM) chewable tablet Chew 1 tablet by mouth daily. Patient not taking: Reported on 10/24/2016 01/03/16   Marylene Land, CNM    ALLERGIES:  Allergies  Allergen Reactions  . Latex Other (See Comments)    Latex condoms cause bacterial infections and irritation.    SOCIAL HISTORY:  Social History   Tobacco Use  . Smoking status: Never Smoker  . Smokeless tobacco: Never Used  Substance Use Topics  . Alcohol use: No    FAMILY HISTORY: Family History  Problem Relation Age of Onset  . Heart disease Father        CHF  . Diabetes Maternal Grandmother   . Hypertension Maternal Grandmother   . Cancer Maternal Grandmother        breast  . Cancer Paternal Grandmother   . Hypertension Mother   . Anesthesia problems Neg Hx   . Hypotension Neg Hx   . Malignant hyperthermia Neg Hx   . Pseudochol deficiency Neg Hx     EXAM: BP (!) 152/83 (BP Location: Right Arm)   Pulse 85   Temp 98.7 F (37.1 C) (Oral)   Resp 16   Ht 5\' 6"  (1.676 m)   Wt 135.6 kg   SpO2 99%   BMI 48.26 kg/m  CONSTITUTIONAL: Alert  and oriented and responds appropriately to questions. Well-appearing; well-nourished, obese HEAD: Normocephalic EYES: Conjunctivae clear, pupils appear equal, EOMI ENT: normal nose; moist mucous membranes; No pharyngeal erythema or petechiae, no tonsillar hypertrophy or exudate, no uvular deviation, no unilateral swelling, no trismus or drooling, no muffled voice, normal phonation, no stridor, no dental caries present, no drainable dental abscess noted, no Ludwig's angina, tongue sits flat in the bottom of the mouth, no angioedema, no facial erythema or warmth, no facial swelling; no pain with movement of the neck.  TMs are clear bilaterally without erythema, purulence, bulging, perforation, effusion.  No cerumen impaction or  sign of foreign body in the external auditory canal. No inflammation, erythema or drainage from the external auditory canal. No signs of mastoiditis. No pain with manipulation of the pinna bilaterally. NECK: Supple, no meningismus, no nuchal rigidity, no LAD  CARD: RRR; S1 and S2 appreciated; no murmurs, no clicks, no rubs, no gallops RESP: Normal chest excursion without splinting or tachypnea; breath sounds clear and equal bilaterally; no wheezes, no rhonchi, no rales, no hypoxia or respiratory distress, speaking full sentences ABD/GI: Normal bowel sounds; non-distended; soft, non-tender, no rebound, no guarding, no peritoneal signs, no hepatosplenomegaly BACK:  The back appears normal and is non-tender to palpation, there is no CVA tenderness EXT: Normal ROM in all joints; non-tender to palpation; no edema; normal capillary refill; no cyanosis, no calf tenderness or swelling    SKIN: Normal color for age and race; warm; no rash NEURO: Moves all extremities equally, strength 5/5 in all 4 extremities, cranial nerves II through XII intact, normal speech PSYCH: The patient's mood and manner are appropriate. Grooming and personal hygiene are appropriate.  MEDICAL DECISION MAKING: Patient here with multiple complaints.  Complaining of migraine headache.  Has had similar headaches in the past.  No focal neurologic deficits.  Doubt sinus thrombosis, intracranial hemorrhage, meningitis, stroke.  I do not feel she needs emergent head imaging.  Will treat with IV fluids, Toradol, Reglan, Benadryl.  She has someone at bedside to drive her home.  Also complaining of nasal congestion and right ear pain.  May be viral in nature.  No signs of otitis media, otitis externa, mastoiditis, deep space neck infection, pharyngitis, PTA, dental infection, Ludwig's.  Patient also having intermittent palpitations.  Labs have been ordered in triage.  Will check electrolytes, hemoglobin, pregnancy test.  She does not appear  dehydrated on exam.  No vomiting or diarrhea recently.  ED PROGRESS: Patient's work-up unremarkable.  Normal hemoglobin, normal electrolytes, negative pregnancy test.  EKG shows no ischemic abnormality or arrhythmia.  Chest x-ray clear.  Patient reports headache that was completely gone.  I feel she is safe to be discharged home.  Will give her outpatient PCP and neurology follow-up.  She may benefit from prophylactic medications to prevent headaches given she has them so frequently.  Discussed return precautions.  She verbalized understanding and is comfortable with plan.   At this time, I do not feel there is any life-threatening condition present. I have reviewed and discussed all results (EKG, imaging, lab, urine as appropriate) and exam findings with patient/family. I have reviewed nursing notes and appropriate previous records.  I feel the patient is safe to be discharged home without further emergent workup and can continue workup as an outpatient as needed. Discussed usual and customary return precautions. Patient/family verbalize understanding and are comfortable with this plan.  Outpatient follow-up has been provided as needed. All questions have been answered.  EKG Interpretation  Date/Time:  Monday April 27 2018 23:40:07 EST Ventricular Rate:  77 PR Interval:    QRS Duration: 97 QT Interval:  384 QTC Calculation: 435 R Axis:   20 Text Interpretation:  Sinus rhythm Low voltage, precordial leads No significant change since last tracing Confirmed by Nazariah Cadet, Baxter Hire [phone]) on 04/27/2018 11:44:20 PM         Kailiana Granquist, Layla Maw, DO 04/28/18 2376

## 2018-04-28 MED ORDER — BUTALBITAL-APAP-CAFFEINE 50-325-40 MG PO TABS: 1.0000 | ORAL_TABLET | Freq: Four times a day (QID) | ORAL | 0 refills | Status: AC | PRN

## 2018-04-28 MED ORDER — PROMETHAZINE HCL 25 MG PO TABS: 25.0000 mg | ORAL_TABLET | Freq: Four times a day (QID) | ORAL | 0 refills | Status: DC | PRN

## 2018-04-28 NOTE — Discharge Instructions (Signed)
Steps to find a Primary Care Provider (PCP): ° °Call [phone] or [phone] to access "[facility] Find a Doctor Service." ° °2.  You may also go on the [facility] website at [email or web address] ° °3.  [facility] also frequently accepts new patients. ° °[facility]  °[address] °[place] [address] °[phone] ° °4.  There are also multiple [facility], [facility] practices throughout the [place] that are frequently accepting new patients. You may find a clinic that is close to your home and contact them. ° °[facility] °[email or web address] °[phone] ° °[facility] °.com ° °[facility] °[email or web address] °[phone] ° °[facility] °[email or web address] °[phone] ° °5.  Local Health Departments also can provide primary care services. ° °[facility]  °[address] °[place] [address] °[phone] ° °[facility] °[address] °[place] [address] [phone] ° °[facility] °[address]  °[place] [address] °[phone] ° ° °

## 2018-05-29 ENCOUNTER — Other Ambulatory Visit: Payer: Self-pay | Admitting: Advanced Practice Midwife

## 2018-05-29 ENCOUNTER — Telehealth: Payer: Self-pay | Admitting: Advanced Practice Midwife

## 2018-05-29 MED ORDER — MEGESTROL ACETATE 40 MG PO TABS: 120.0000 mg | ORAL_TABLET | Freq: Every day | ORAL | 3 refills | Status: DC

## 2018-05-29 NOTE — Telephone Encounter (Signed)
Pt called to report her refills of Megace have expired and she would like a refill. Pt plans to f/u at [facility] office for AUB.  Refill of Megace 40 mg TID with 90 tablets and 3 refills sent to [facility] today.  Pt at pharmacy now to pick up Rx.  Discussed use of Megace TID if heavy bleeding then reduce to BID as bleeding resolves. Pt states understanding and plans to f/u at [facility] office soon.  Bleeding precautions/reasons to seek care reviewed.

## 2018-10-20 ENCOUNTER — Other Ambulatory Visit: Payer: Self-pay

## 2018-10-20 ENCOUNTER — Encounter (HOSPITAL_COMMUNITY): Payer: Self-pay | Admitting: Emergency Medicine

## 2018-10-20 DIAGNOSIS — R030 Elevated blood-pressure reading, without diagnosis of hypertension: Secondary | ICD-10-CM | POA: Diagnosis not present

## 2018-10-20 DIAGNOSIS — Z79899 Other long term (current) drug therapy: Secondary | ICD-10-CM | POA: Diagnosis not present

## 2018-10-20 DIAGNOSIS — M545 Low back pain: Secondary | ICD-10-CM | POA: Diagnosis present

## 2018-10-20 DIAGNOSIS — M5441 Lumbago with sciatica, right side: Secondary | ICD-10-CM | POA: Insufficient documentation

## 2018-10-20 NOTE — ED Triage Notes (Signed)
Patient c/o right lower back pain radiating down right leg intermittent x1 months, constant x3 days. Reports pain worsens with movement. Denies urinary sx. Ambulatory.

## 2018-10-21 ENCOUNTER — Emergency Department (HOSPITAL_COMMUNITY)
Admission: EM | Admit: 2018-10-21 | Discharge: 2018-10-21 | Disposition: A | Discharge status: Home / Self Care | Payer: BC Managed Care – PPO | Attending: Emergency Medicine | Admitting: Emergency Medicine

## 2018-10-21 ENCOUNTER — Emergency Department (HOSPITAL_COMMUNITY): Payer: BC Managed Care – PPO

## 2018-10-21 DIAGNOSIS — R030 Elevated blood-pressure reading, without diagnosis of hypertension: Secondary | ICD-10-CM

## 2018-10-21 DIAGNOSIS — M5441 Lumbago with sciatica, right side: Secondary | ICD-10-CM

## 2018-10-21 LAB — POC URINE PREG, ED: Preg Test, Ur: NEGATIVE

## 2018-10-21 MED ORDER — PREDNISONE 10 MG PO TABS: 40.0000 mg | ORAL_TABLET | Freq: Every day | ORAL | 0 refills | Status: AC

## 2018-10-21 MED ORDER — LIDOCAINE 5 % EX PTCH
1.0000 | MEDICATED_PATCH | CUTANEOUS | Status: DC
  Administered 2018-10-21: 03:00:00 1 via TRANSDERMAL
  Filled 2018-10-21: qty 1

## 2018-10-21 MED ORDER — LIDOCAINE 5 % EX PTCH: 1.0000 | MEDICATED_PATCH | CUTANEOUS | 0 refills | Status: DC

## 2018-10-21 MED ORDER — METHOCARBAMOL 500 MG PO TABS
500.0000 mg | ORAL_TABLET | Freq: Once | ORAL | Status: AC
  Administered 2018-10-21: 500 mg via ORAL
  Filled 2018-10-21: qty 1

## 2018-10-21 MED ORDER — METHOCARBAMOL 500 MG PO TABS: 500.0000 mg | ORAL_TABLET | Freq: Two times a day (BID) | ORAL | 0 refills | Status: DC

## 2018-10-21 MED ORDER — PREDNISONE 20 MG PO TABS
40.0000 mg | ORAL_TABLET | Freq: Once | ORAL | Status: AC
  Administered 2018-10-21: 03:00:00 40 mg via ORAL
  Filled 2018-10-21: qty 2

## 2018-10-21 MED ORDER — KETOROLAC TROMETHAMINE 15 MG/ML IJ SOLN
15.0000 mg | Freq: Once | INTRAMUSCULAR | Status: AC
  Administered 2018-10-21: 15 mg via INTRAVENOUS
  Filled 2018-10-21: qty 1

## 2018-10-21 NOTE — ED Provider Notes (Signed)
[facility]-EMERGENCY DEPT Provider Note   CSN: [id number] Arrival date & time: 10/20/18  2230    History   Chief Complaint Chief Complaint  Patient presents with  . Back Pain    HPI Regina Watkins is a 33 y.o. female presents today for right lower back pain rating down her right leg.  Reports that this is been a problem intermittently for multiple months.  Current exacerbation began 3 days ago she describes a sharp moderate-severe pain that radiates from her right buttocks down the outside of her right leg, intermittent worsened with bending and movement and improved with rest.  No medications prior to arrival for her pain.  She denies any injury or trauma.  She believes this exacerbation is caused by her prolonged standing at work.  She denies fever/chills, IVDU, injury/trauma, saddle area paresthesias, bowel/bladder incontinence, urinary retention, numbness/weakness/tingling of the extremities, nausea/vomiting, abdominal pain, dysuria/hematuria or any additional concerns today.    HPI  Past Medical History:  Diagnosis Date  . Anemia   . BV (bacterial vaginosis)   . Ectopic pregnancy   . Infection    UTI  . No pertinent past medical history   . Obesity   . Strep throat   . Trichomonas     Patient Active Problem List   Diagnosis Date Noted  . Mastalgia 06/11/2011  . 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 History of ectopic pregnancy 06/11/2011    Past Surgical History:  Procedure Laterality Date  . SALPINGECTOMY     removal of ectopic preg and tube     OB History    Gravida  1   Para  0   Term  0   Preterm  0   AB  1   Living  0     SAB  0   TAB  0   Ectopic  1   Multiple  0   Live Births               Home Medications    Prior to Admission medications   Medication Sig Start Date End Date Taking? Authorizing Provider  acetaminophen (TYLENOL) 325 MG tablet Take 650 mg by mouth every 6 (six) hours as needed  for moderate pain.    [provider]  butalbital-acetaminophen-caffeine (FIORICET, ESGIC) [phone] MG tablet Take 1-2 tablets by mouth every 6 (six) hours as needed for headache. 04/28/18 04/28/19  Ward, Layla MawKristen N, DO  Lactobacillus TABS Take 1 tablet by mouth at bedtime. 11/18/17   Clayton BiblesWeinhold, Samantha C, CNM  lidocaine (LIDODERM) 5 % Place 1 patch onto the skin daily. Remove & Discard patch within 12 hours or as directed by MD 10/21/18   Bill SalinasMorelli,  A, PA-C  megestrol (MEGACE) 40 MG tablet Take 3 tablets (120 mg total) by mouth daily. 05/29/18   Leftwich-Kirby, Wilmer FloorLisa A, CNM  methocarbamol (ROBAXIN) 500 MG tablet Take 1 tablet (500 mg total) by mouth 2 (two) times daily. 10/21/18   Harlene SaltsMorelli,  A, PA-C  metroNIDAZOLE (FLAGYL) 500 MG tablet Take 1 tablet (500 mg total) by mouth 2 (two) times daily. 11/18/17   Calvert CantorWeinhold, Samantha C, CNM  multivitamin-iron-minerals-folic acid (CENTRUM) chewable tablet Chew 1 tablet by mouth daily. Patient not taking: Reported on 10/24/2016 01/03/16   Marylene LandKooistra, Kathryn Lorraine, CNM  predniSONE (DELTASONE) 10 MG tablet Take 4 tablets (40 mg total) by mouth daily for 4 days. 10/21/18 10/25/18  Harlene SaltsMorelli,  A, PA-C  promethazine (PHENERGAN) 25 MG tablet Take 1 tablet (25  mg total) by mouth every 6 (six) hours as needed for nausea or vomiting. 04/28/18   Ward, Layla MawKristen N, DO    Family History Family History  Problem Relation Age of Onset  . Heart disease Father        CHF  . Diabetes Maternal Grandmother   . Hypertension Maternal Grandmother   . Cancer Maternal Grandmother        breast  . Cancer Paternal Grandmother   . Hypertension Mother   . Anesthesia problems Neg Hx   . Hypotension Neg Hx   . Malignant hyperthermia Neg Hx   . Pseudochol deficiency Neg Hx     Social History Social History   Tobacco Use  . Smoking status: Never Smoker  . Smokeless tobacco: Never Used  Substance Use Topics  . Alcohol use: No  . Drug use: No     Allergies    Latex   Review of Systems Review of Systems Ten systems are reviewed and are negative for acute change except as noted in the HPI  Physical Exam Updated Vital Signs BP (!) 150/108 (BP Location: Right Arm)   Pulse 87   Temp 99 F (37.2 C) (Oral)   Resp 18   SpO2 99%   Physical Exam Constitutional:      General: She is not in acute distress.    Appearance: Normal appearance. She is obese. She is not ill-appearing or diaphoretic.  HENT:     Head: Normocephalic and atraumatic.     Jaw: There is normal jaw occlusion.     Right Ear: External ear normal.     Left Ear: External ear normal.     Nose: Nose normal.  Eyes:     General: Vision grossly intact. Gaze aligned appropriately.     Conjunctiva/sclera: Conjunctivae normal.     Pupils: Pupils are equal, round, and reactive to light.  Neck:     Musculoskeletal: Full passive range of motion without pain, normal range of motion and neck supple.     Trachea: Trachea and phonation normal. No tracheal tenderness or tracheal deviation.  Cardiovascular:     Rate and Rhythm: Normal rate and regular rhythm.     Pulses:          Dorsalis pedis pulses are 2+ on the right side and 2+ on the left side.  Pulmonary:     Effort: Pulmonary effort is normal. No respiratory distress.     Breath sounds: Normal air entry.  Abdominal:     General: Bowel sounds are normal. There is no distension.     Palpations: Abdomen is soft. There is no pulsatile mass.     Tenderness: There is no abdominal tenderness. There is no guarding or rebound.  Musculoskeletal:       Back:     Comments: No midline C/T/L spinal tenderness to palpation no deformity, crepitus, or step-off noted. No sign of injury to the neck or back.  Reproducible tenderness of the right gluteal musculature, positive right straight leg raise.   Feet:     Right foot:     Protective Sensation: 3 sites tested. 3 sites sensed.     Left foot:     Protective Sensation: 3 sites tested. 3  sites sensed.  Skin:    General: Skin is warm and dry.     Capillary Refill: Capillary refill takes less than 2 seconds.  Neurological:     Mental Status: She is alert.     GCS: GCS eye  subscore is 4. GCS verbal subscore is 5. GCS motor subscore is 6.     Comments: Speech is clear and goal oriented, follows commands Major Cranial nerves without deficit, no facial droop Normal strength in upper and lower extremities bilaterally including dorsiflexion and plantar flexion, strong and equal grip strength Sensation normal to light and sharp touch Moves extremities without ataxia, coordination intact DTR 2+ bilateral patella, no clonus of the feet Normal gait  Psychiatric:        Behavior: Behavior is cooperative.    ED Treatments / Results  Labs (all labs ordered are listed, but only abnormal results are displayed) Labs Reviewed  POC URINE PREG, ED    EKG None  Radiology Dg Lumbar Spine Complete  Result Date: 10/21/2018 CLINICAL DATA:  Initial evaluation for acute intermittent lower back pain. EXAM: LUMBAR SPINE - COMPLETE 4+ VIEW COMPARISON:  None. FINDINGS: Mild dextroscoliosis with straightening of the normal lumbar lordosis. 8 mm retrolisthesis of L5 on S1, with trace retrolisthesis of L4 on L5. Vertebral body height maintained without evidence for acute or chronic fracture. Visualized sacrum and pelvis intact. SI joints approximated symmetric. Mild degenerative intervertebral disc space narrowing with endplate changes at T12-L1. Intervertebral disc space height otherwise maintained. Visualized soft tissues demonstrate no acute finding. IMPRESSION: 1. No radiographic evidence for acute or chronic abnormality within the lumbar spine. 2. 8 mm retrolisthesis of L5 on S1. 3. Mild degenerative intervertebral disc space narrowing with endplate changes at T12-L1. Electronically Signed   By: Rise MuBenjamin  McClintock M.D.   On: 10/21/2018 03:01    Procedures Procedures (including critical care  time)  Medications Ordered in ED Medications  lidocaine (LIDODERM) 5 % 1 patch (1 patch Transdermal Patch Applied 10/21/18 0324)  ketorolac (TORADOL) 15 MG/ML injection 15 mg (15 mg Intravenous Given 10/21/18 0325)  methocarbamol (ROBAXIN) tablet 500 mg (500 mg Oral Given 10/21/18 0324)  predniSONE (DELTASONE) tablet 40 mg (40 mg Oral Given 10/21/18 0324)     Initial Impression / Assessment and Plan / ED Course  I have reviewed the triage vital signs and the nursing notes.  Pertinent labs & imaging results that were available during my care of the patient were reviewed by me and considered in my medical decision making (see chart for details).    Regina Watkins is a 33 y.o. female presenting with right-sided low back pain with right-sided sciatica.  Pain intermittent for multiple months, worsening over the past few days.  Patient without prior imaging.  Patient denies history of trauma, fever, IV drug use, night sweats, weight loss, cancer, saddle anesthesia, urinary rentention, bowel/bladder incontinence. No neurological deficits and normal neuro exam.   Suspect musculoskeletal etiology of patient's pain. Pain is consistently reproducible with palpation of the right gluteal musculature and right straight leg raise.  Abdomen soft/nontender and without pulsatile mass. Patient with equal pedal pulses. Doubt spinal epidural abscess, cauda equina, AAA or other emergent etiologies at this time.  As patient with pain for multiple months and no prior imaging will obtain plain films at this time.  Urine Pregnancy: Negative DG Lumbar Spine:  IMPRESSION:  1. No radiographic evidence for acute or chronic abnormality within  the lumbar spine.  2. 8 mm retrolisthesis of L5 on S1.  3. Mild degenerative intervertebral disc space narrowing with  endplate changes at T12-L1.   Patient is ambulatory in the emergency department without assistance. RICE protocol and pain medicine indicated and discussed  with patient.   Toradol 15 mg IM  given. Patient denies history of CKD or gastric ulcers/bleeding. Robaxin 500mg  BID prescribed. Patient informed to avoid driving or operating heavy machinery while taking muscle relaxer.  Patient with ride home today. Prednisone 40 mg x 5 days given for sciatica, patient denies history of diabetes. Lidoderm patches given for symptomatic treatment. - The patient was noted to have elevated BP in ED today. I have spoken with the patient regarding elevated blood pressure readings and the need for improved management. I instructed the patient to followup with their PCP within 1 week for BP check. I also counseled the patient regarding the signs and symptoms which would require an emergent visit to an emergency department for hypertensive urgency and/or hypertensive emergency.  At this time there does not appear to be any evidence of an acute emergency medical condition and the patient appears stable for discharge with appropriate outpatient follow up. Diagnosis was discussed with patient who verbalizes understanding of care plan and is agreeable to discharge. I have discussed return precautions with patient who verbalizes understanding of return precautions. Patient encouraged to follow-up with their PCP. All questions answered. Patient has been discharged in good condition.  Patient's case discussed with Dr. Leonette Monarch who agrees with plan to discharge with neurosurgery outpatient follow-up.   Note: Portions of this report may have been transcribed using voice recognition software. Every effort was made to ensure accuracy; however, inadvertent computerized transcription errors may still be present. Final Clinical Impressions(s) / ED Diagnoses   Final diagnoses:  Acute right-sided low back pain with right-sided sciatica  Elevated blood pressure reading    ED Discharge Orders         Ordered    predniSONE (DELTASONE) 10 MG tablet  Daily     10/21/18 0406     methocarbamol (ROBAXIN) 500 MG tablet  2 times daily     10/21/18 0406    lidocaine (LIDODERM) 5 %  Every 24 hours     10/21/18 0406           Deliah Boston, PA-C 10/21/18 0407    Fatima Blank, MD 10/23/18 [phone]

## 2018-10-21 NOTE — Discharge Instructions (Signed)
You have been diagnosed today with right-sided low back pain with sciatica and elevated blood pressure reading.  At this time there does not appear to be the presence of an emergent medical condition, however there is always the potential for conditions to change. Please read and follow the below instructions.  Please return to the Emergency Department immediately for any new or worsening symptoms. Please be sure to follow up with your Primary Care Provider within one week regarding your visit today; please call their office to schedule an appointment even if you are feeling better for a follow-up visit. Your x-ray today showed 8 mm retrolisthesis of your L5-S1 vertebrae, additionally it showed mild degenerative intervertebral disc space narrowing with endplate changes of [id number].  Please call the spine specialist Dr. Reinaldo Meeker on your discharge paperwork to schedule a follow-up appointment. You may use the muscle relaxer Robaxin as prescribed to help with your symptoms.  Do not drive or operate heavy machinery while taking Robaxin as it will make you drowsy.  Do not drink alcohol or take other sedating medications while taking Robaxin as this will worsen side effects. You have been given an NSAID-containing medication called Toradol today.  Do not take the medications including ibuprofen, Aleve, Advil, naproxen or other NSAID-containing medications for the next 2 days.  Please be sure to drink plenty of water over the next few days. You may use the medication prednisone as prescribed to help with your sciatic-like symptoms.  If you are diabetic please monitor your blood sugar levels closely while taking prednisone. You may use the Lidoderm patches as prescribed to help with your pain. Your blood pressure was elevated today.  Please call your primary care provider to schedule a follow-up appointment for blood pressure recheck and medication management within 1 week.  Get help right away if you: Get a very  bad headache. Start to feel mixed up (confused). Feel weak or numb. Feel faint. Have very bad pain in your: Chest. Belly (abdomen). Throw up more than once. Have trouble breathing. You cannot control when you pee (urinate) or poop (have a bowel movement). You have weakness in any of these areas and it gets worse: Lower back. The area between your hip bones. Butt. Legs. You have redness or swelling of your back. You have a burning feeling when you pee. Any new/concerning or worsening symptoms  Please read the additional information packets attached to your discharge summary.  Do not take your medicine if  develop an itchy rash, swelling in your mouth or lips, or difficulty breathing; call 911 and seek immediate emergency medical attention if this occurs.

## 2019-03-03 ENCOUNTER — Ambulatory Visit: Payer: BC Managed Care – PPO | Attending: Internal Medicine

## 2019-03-03 DIAGNOSIS — Z20822 Contact with and (suspected) exposure to covid-19: Secondary | ICD-10-CM

## 2019-03-04 LAB — NOVEL CORONAVIRUS, NAA: SARS-CoV-2, NAA: NOT DETECTED

## 2019-04-09 ENCOUNTER — Ambulatory Visit: Payer: 59 | Attending: Internal Medicine

## 2019-04-09 DIAGNOSIS — Z20822 Contact with and (suspected) exposure to covid-19: Secondary | ICD-10-CM

## 2019-04-10 LAB — NOVEL CORONAVIRUS, NAA: SARS-CoV-2, NAA: NOT DETECTED

## 2019-04-13 ENCOUNTER — Encounter: Payer: Self-pay | Admitting: Advanced Practice Midwife

## 2019-04-13 ENCOUNTER — Other Ambulatory Visit: Payer: Self-pay

## 2019-04-13 ENCOUNTER — Ambulatory Visit (INDEPENDENT_AMBULATORY_CARE_PROVIDER_SITE_OTHER): Payer: 59 | Admitting: Advanced Practice Midwife

## 2019-04-13 VITALS — BP 133/61 | HR 79 | Ht 66.0 in | Wt 295.8 lb

## 2019-04-13 DIAGNOSIS — N898 Other specified noninflammatory disorders of vagina: Secondary | ICD-10-CM | POA: Diagnosis not present

## 2019-04-13 DIAGNOSIS — N939 Abnormal uterine and vaginal bleeding, unspecified: Secondary | ICD-10-CM | POA: Diagnosis not present

## 2019-04-13 DIAGNOSIS — Z01419 Encounter for gynecological examination (general) (routine) without abnormal findings: Secondary | ICD-10-CM

## 2019-04-13 DIAGNOSIS — Z124 Encounter for screening for malignant neoplasm of cervix: Secondary | ICD-10-CM

## 2019-04-13 DIAGNOSIS — Z113 Encounter for screening for infections with a predominantly sexual mode of transmission: Secondary | ICD-10-CM

## 2019-04-13 DIAGNOSIS — Z1151 Encounter for screening for human papillomavirus (HPV): Secondary | ICD-10-CM

## 2019-04-13 LAB — POCT URINALYSIS DIP (DEVICE)
Bilirubin Urine: NEGATIVE
Glucose, UA: NEGATIVE mg/dL
Ketones, ur: NEGATIVE mg/dL
Leukocytes,Ua: NEGATIVE
Nitrite: NEGATIVE
Protein, ur: NEGATIVE mg/dL
Specific Gravity, Urine: 1.025 (ref 1.005–1.030)
Urobilinogen, UA: 0.2 mg/dL (ref 0.0–1.0)
pH: 5.5 (ref 5.0–8.0)

## 2019-04-13 NOTE — Patient Instructions (Signed)
Preventive Care 21-34 Years Old, Female Preventive care refers to visits with your health care provider and lifestyle choices that can promote health and wellness. This includes:  A yearly physical exam. This may also be called an annual well check.  Regular dental visits and eye exams.  Immunizations.  Screening for certain conditions.  Healthy lifestyle choices, such as eating a healthy diet, getting regular exercise, not using drugs or products that contain nicotine and tobacco, and limiting alcohol use. What can I expect for my preventive care visit? Physical exam Your health care provider will check your:  Height and weight. This may be used to calculate body mass index (BMI), which tells if you are at a healthy weight.  Heart rate and blood pressure.  Skin for abnormal spots. Counseling Your health care provider may ask you questions about your:  Alcohol, tobacco, and drug use.  Emotional well-being.  Home and relationship well-being.  Sexual activity.  Eating habits.  Work and work environment.  Method of birth control.  Menstrual cycle.  Pregnancy history. What immunizations do I need?  Influenza (flu) vaccine  This is recommended every year. Tetanus, diphtheria, and pertussis (Tdap) vaccine  You may need a Td booster every 10 years. Varicella (chickenpox) vaccine  You may need this if you have not been vaccinated. Human papillomavirus (HPV) vaccine  If recommended by your health care provider, you may need three doses over 6 months. Measles, mumps, and rubella (MMR) vaccine  You may need at least one dose of MMR. You may also need a second dose. Meningococcal conjugate (MenACWY) vaccine  One dose is recommended if you are age 19-21 years and a first-year college student living in a residence hall, or if you have one of several medical conditions. You may also need additional booster doses. Pneumococcal conjugate (PCV13) vaccine  You may need  this if you have certain conditions and were not previously vaccinated. Pneumococcal polysaccharide (PPSV23) vaccine  You may need one or two doses if you smoke cigarettes or if you have certain conditions. Hepatitis A vaccine  You may need this if you have certain conditions or if you travel or work in places where you may be exposed to hepatitis A. Hepatitis B vaccine  You may need this if you have certain conditions or if you travel or work in places where you may be exposed to hepatitis B. Haemophilus influenzae type b (Hib) vaccine  You may need this if you have certain conditions. You may receive vaccines as individual doses or as more than one vaccine together in one shot (combination vaccines). Talk with your health care provider about the risks and benefits of combination vaccines. What tests do I need?  Blood tests  Lipid and cholesterol levels. These may be checked every 5 years starting at age 20.  Hepatitis C test.  Hepatitis B test. Screening  Diabetes screening. This is done by checking your blood sugar (glucose) after you have not eaten for a while (fasting).  Sexually transmitted disease (STD) testing.  BRCA-related cancer screening. This may be done if you have a family history of breast, ovarian, tubal, or peritoneal cancers.  Pelvic exam and Pap test. This may be done every 3 years starting at age 21. Starting at age 30, this may be done every 5 years if you have a Pap test in combination with an HPV test. Talk with your health care provider about your test results, treatment options, and if necessary, the need for more tests.   Follow these instructions at home: Eating and drinking   Eat a diet that includes fresh fruits and vegetables, whole grains, lean protein, and low-fat dairy.  Take vitamin and mineral supplements as recommended by your health care provider.  Do not drink alcohol if: ? Your health care provider tells you not to drink. ? You are  pregnant, may be pregnant, or are planning to become pregnant.  If you drink alcohol: ? Limit how much you have to 0-1 drink a day. ? Be aware of how much alcohol is in your drink. In the [place] one drink equals one 12 oz bottle of beer (355 mL), one 5 oz glass of wine (148 mL), or one 1 oz glass of hard liquor (44 mL). Lifestyle  Take daily care of your teeth and gums.  Stay active. Exercise for at least 30 minutes on 5 or more days each week.  Do not use any products that contain nicotine or tobacco, such as cigarettes, e-cigarettes, and chewing tobacco. If you need help quitting, ask your health care provider.  If you are sexually active, practice safe sex. Use a condom or other form of birth control (contraception) in order to prevent pregnancy and STIs (sexually transmitted infections). If you plan to become pregnant, see your health care provider for a preconception visit. What's next?  Visit your health care provider once a year for a well check visit.  Ask your health care provider how often you should have your eyes and teeth checked.  Stay up to date on all vaccines. This information is not intended to replace advice given to you by your health care provider. Make sure you discuss any questions you have with your health care provider. Document Revised: 10/30/2017 Document Reviewed: 10/30/2017 Elsevier Patient Education  2020 [employer].

## 2019-04-13 NOTE — Progress Notes (Signed)
GYNECOLOGY ANNUAL PREVENTATIVE CARE ENCOUNTER NOTE  History:     Regina Watkins is a 34 y.o. [id number] female here for a routine annual gynecologic exam.  Current complaints: Patient desires pregnancy but is finding it difficult to track ovulation and coordinate intercourse due to recurrent abnormal discharge and irregular periods. She is managing her intermittently heavy and prolonged cycles with an existing prescription for Megace.   Denies abnormal vaginal bleeding, discharge, pelvic pain, problems with intercourse or other gynecologic concerns.    Patient lives with Jill Alexanders, her boyfriend of 15 years. She is a no-smoker, denies SI, HI, IPV. 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 She reports feeling very fulfilled and happy with her life. She requests STI workup. She also requests well woman health maintenance labs.   Gynecologic History Patient's last menstrual period was 01/08/2019. Contraception: none Last Pap: 2015. Results were: normal with negative HPV Last mammogram: N/A.   Obstetric History OB History  Gravida Para Term Preterm AB Living  1 0 0 0 1 0  SAB TAB Ectopic Multiple Live Births  0 0 1 0      # Outcome Date GA Lbr Len/2nd Weight Sex Delivery Anes PTL Lv  1 Ectopic 04/22/07            Past Medical History:  Diagnosis Date  . Anemia   . BV (bacterial vaginosis)   . Ectopic pregnancy   . Infection    UTI  . No pertinent past medical history   . Obesity   . Strep throat   . Trichomonas     Past Surgical History:  Procedure Laterality Date  . SALPINGECTOMY     removal of ectopic preg and tube    Current Outpatient Medications on File Prior to Visit  Medication Sig Dispense Refill  . acetaminophen (TYLENOL) 325 MG tablet Take 650 mg by mouth every 6 (six) hours as needed for moderate pain.    . butalbital-acetaminophen-caffeine (FIORICET, ESGIC) 50-325-40 MG tablet Take 1-2 tablets by mouth every 6 (six) hours as needed for headache. 20 tablet 0    . megestrol (MEGACE) 40 MG tablet Take 3 tablets (120 mg total) by mouth daily. 90 tablet 3  . zinc gluconate 50 MG tablet Take 50 mg by mouth daily.    . methocarbamol (ROBAXIN) 500 MG tablet Take 1 tablet (500 mg total) by mouth 2 (two) times daily. (Patient not taking: Reported on 04/13/2019) 14 tablet 0  . metroNIDAZOLE (FLAGYL) 500 MG tablet Take 1 tablet (500 mg total) by mouth 2 (two) times daily. 14 tablet 0  . multivitamin-iron-minerals-folic acid (CENTRUM) chewable tablet Chew 1 tablet by mouth daily. (Patient not taking: Reported on 10/24/2016) 30 tablet 2  . promethazine (PHENERGAN) 25 MG tablet Take 1 tablet (25 mg total) by mouth every 6 (six) hours as needed for nausea or vomiting. (Patient not taking: Reported on 04/13/2019) 15 tablet 0   No current facility-administered medications on file prior to visit.    Allergies  Allergen Reactions  . Latex Other (See Comments)    Latex condoms cause bacterial infections and irritation.    Social History:  reports that she has never smoked. She has never used smokeless tobacco. She reports that she does not drink alcohol or use drugs.  Family History  Problem Relation Age of Onset  . Heart disease Father        CHF  . Diabetes Maternal Grandmother   . Hypertension Maternal Grandmother   .  Cancer Maternal Grandmother        breast  . Cancer Paternal Grandmother   . Hypertension Mother   . Anesthesia problems Neg Hx   . Hypotension Neg Hx   . Malignant hyperthermia Neg Hx   . Pseudochol deficiency Neg Hx     The following portions of the patient's history were reviewed and updated as appropriate: allergies, current medications, past family history, past medical history, past social history, past surgical history and problem list.  Review of Systems Pertinent items noted in HPI and remainder of comprehensive ROS otherwise negative.  Physical Exam:  BP 133/61   Pulse 79   Ht 5\' 6"  (1.676 m)   Wt 295 lb 12.8 oz (134.2 kg)    LMP 01/08/2019   BMI 47.74 kg/m  CONSTITUTIONAL: Well-developed, well-nourished female in no acute distress.  HENT:  Normocephalic, atraumatic, External right and left ear normal. Oropharynx is clear and moist EYES: Conjunctivae and EOM are normal. Pupils are equal, round, and reactive to light. No scleral icterus.  NECK: Normal range of motion, supple, no masses.  Normal thyroid.  SKIN: Skin is warm and dry. No rash noted. Not diaphoretic. No erythema. No pallor. MUSCULOSKELETAL: Normal range of motion. No tenderness.  No cyanosis, clubbing, or edema.  2+ distal pulses. NEUROLOGIC: Alert and oriented to person, place, and time. Normal reflexes, muscle tone coordination.  PSYCHIATRIC: Normal mood and affect. Normal behavior. Normal judgment and thought content. CARDIOVASCULAR: Normal heart rate noted, regular rhythm RESPIRATORY: Clear to auscultation bilaterally. Effort and breath sounds normal, no problems with respiration noted. BREASTS: Symmetric in size. No masses, tenderness, skin changes, nipple drainage, or lymphadenopathy bilaterally. ABDOMEN: Soft, no distention noted.  No tenderness, rebound or guarding.  PELVIC: Normal appearing external genitalia and urethral meatus; normal appearing vaginal mucosa and cervix.  Small amount of thick white discharge noted, not foul-smelling.  Pap smear obtained.  Normal uterine size, no other palpable masses, no uterine or adnexal tenderness.   Assessment and Plan:    1. Women's annual routine gynecological examination  - Cytology - PAP( Terminous) - RPR - HIV Antibody (routine testing w rflx) - Hepatitis C Antibody - Hepatitis B Surface AntiGEN - CBC - TSH - Hemoglobin A1c - Comprehensive metabolic panel - Lipid panel - US PELVIC COMPLETE WITH TRANSVAGINAL; Future  2. Vaginal discharge  - Cervicovaginal ancillary only( Secretary)  3. Abnormal uterine bleeding - Discussed that long-term use of previously prescribed Megace is not  advised   Will follow up results of pap smear and manage accordingly. Routine preventative health maintenance measures emphasized. Please refer to After Visit Summary for other counseling recommendations.      Mallie Snooks, MSN, CNM Certified Nurse Midwife, [facility] for [employer], [place] [facility] 04/13/19 11:39 AM

## 2019-04-13 NOTE — Progress Notes (Signed)
Pt states feels that she may have a UTI, not sure if she has Fibroids or not, would like to get checked. Has not had any [place] to confirm.

## 2019-04-14 ENCOUNTER — Encounter: Payer: Self-pay | Admitting: Advanced Practice Midwife

## 2019-04-14 ENCOUNTER — Telehealth: Payer: Self-pay | Admitting: *Deleted

## 2019-04-14 DIAGNOSIS — R7309 Other abnormal glucose: Secondary | ICD-10-CM | POA: Insufficient documentation

## 2019-04-14 DIAGNOSIS — E1169 Type 2 diabetes mellitus with other specified complication: Secondary | ICD-10-CM

## 2019-04-14 LAB — LIPID PANEL
Chol/HDL Ratio: 3.2 ratio (ref 0.0–4.4)
Cholesterol, Total: 115 mg/dL (ref 100–199)
HDL: 36 mg/dL — ABNORMAL LOW (ref >39)
LDL Chol Calc (NIH): 64 mg/dL (ref 0–99)
Triglycerides: 74 mg/dL (ref 0–149)
VLDL Cholesterol Cal: 15 mg/dL (ref 5–40)

## 2019-04-14 LAB — COMPREHENSIVE METABOLIC PANEL
ALT: 14 IU/L (ref 0–32)
AST: 10 IU/L (ref 0–40)
Albumin/Globulin Ratio: 1.4 (ref 1.2–2.2)
Albumin: 4.1 g/dL (ref 3.8–4.8)
Alkaline Phosphatase: 86 IU/L (ref 39–117)
BUN/Creatinine Ratio: 16 (ref 9–23)
BUN: 12 mg/dL (ref 6–20)
Bilirubin Total: 0.5 mg/dL (ref 0.0–1.2)
CO2: 21 mmol/L (ref 20–29)
Calcium: 9.3 mg/dL (ref 8.7–10.2)
Chloride: 105 mmol/L (ref 96–106)
Creatinine, Ser: 0.73 mg/dL (ref 0.57–1.00)
GFR calc Af Amer: 125 mL/min/{1.73_m2} (ref >59)
GFR calc non Af Amer: 109 mL/min/{1.73_m2} (ref >59)
Globulin, Total: 2.9 g/dL (ref 1.5–4.5)
Glucose: 151 mg/dL — ABNORMAL HIGH (ref 65–99)
Potassium: 4.1 mmol/L (ref 3.5–5.2)
Sodium: 138 mmol/L (ref 134–144)
Total Protein: 7 g/dL (ref 6.0–8.5)

## 2019-04-14 LAB — CBC
Hematocrit: 35.8 % (ref 34.0–46.6)
Hemoglobin: 11.3 g/dL (ref 11.1–15.9)
MCH: 23.3 pg — ABNORMAL LOW (ref 26.6–33.0)
MCHC: 31.6 g/dL (ref 31.5–35.7)
MCV: 74 fL — ABNORMAL LOW (ref 79–97)
Platelets: 393 10*3/uL (ref 150–450)
RBC: 4.85 x10E6/uL (ref 3.77–5.28)
RDW: 15.6 % — ABNORMAL HIGH (ref 11.7–15.4)
WBC: 9.6 10*3/uL (ref 3.4–10.8)

## 2019-04-14 LAB — HIV ANTIBODY (ROUTINE TESTING W REFLEX): HIV Screen 4th Generation wRfx: NONREACTIVE

## 2019-04-14 LAB — CERVICOVAGINAL ANCILLARY ONLY
Bacterial Vaginitis (gardnerella): NEGATIVE
Candida Glabrata: NEGATIVE
Candida Vaginitis: NEGATIVE
Comment: NEGATIVE
Comment: NEGATIVE
Comment: NEGATIVE
Comment: NEGATIVE
Trichomonas: NEGATIVE

## 2019-04-14 LAB — CYTOLOGY - PAP
Adequacy: ABSENT
Chlamydia: NEGATIVE
Comment: NEGATIVE
Comment: NEGATIVE
Comment: NEGATIVE
Comment: NORMAL
Diagnosis: NEGATIVE
High risk HPV: NEGATIVE
Neisseria Gonorrhea: NEGATIVE
Trichomonas: NEGATIVE

## 2019-04-14 LAB — HEMOGLOBIN A1C
Est. average glucose Bld gHb Est-mCnc: 157 mg/dL
Hgb A1c MFr Bld: 7.1 % — ABNORMAL HIGH (ref 4.8–5.6)

## 2019-04-14 LAB — HEPATITIS C ANTIBODY: Hep C Virus Ab: <0.1 s/co ratio (ref 0.0–0.9)

## 2019-04-14 LAB — RPR: RPR Ser Ql: NONREACTIVE

## 2019-04-14 LAB — HEPATITIS B SURFACE ANTIGEN: Hepatitis B Surface Ag: NEGATIVE

## 2019-04-14 LAB — TSH: TSH: 1.71 u[IU]/mL (ref 0.450–4.500)

## 2019-04-14 NOTE — Telephone Encounter (Signed)
I called [name] and informed her we had gotten a message from her provider to notify her that her A1c was elevated and she needs a referral to [facility] for education and also needs a PCP.  I informed her she should get  A call from [facility] soon with an appointment. I also asked if she has a PCp. She reports she has been trying to get an appointment from list her insurance provided and others and so far no one is accepting new patients. I encouraged her to continue calling and also informed her I put in the referral order to PCP [facility] so she should get a call from them . I also reviewed her other lab results from 04/13/19  and explained pap is not resulted yet.  She voices understanding. Angelika Jerrett,RN

## 2019-04-14 NOTE — Telephone Encounter (Signed)
Regina Watkins called this am and left a voice mail message she wants her results.  Henok Heacock,RN

## 2019-04-14 NOTE — Telephone Encounter (Signed)
Wonderful, thanks so much! SW, CNM

## 2019-04-15 ENCOUNTER — Ambulatory Visit (HOSPITAL_COMMUNITY): Payer: 59

## 2019-04-15 NOTE — Addendum Note (Signed)
Addended by: Gerome Apley on: 04/15/2019 12:15 PM   Modules accepted: Orders

## 2019-04-20 ENCOUNTER — Ambulatory Visit (HOSPITAL_COMMUNITY)
Admission: RE | Admit: 2019-04-20 | Discharge: 2019-04-20 | Disposition: A | Discharge status: Home / Self Care | Payer: 59 | Source: Ambulatory Visit | Attending: Advanced Practice Midwife | Admitting: Advanced Practice Midwife

## 2019-04-20 ENCOUNTER — Other Ambulatory Visit: Payer: Self-pay

## 2019-04-20 DIAGNOSIS — Z01419 Encounter for gynecological examination (general) (routine) without abnormal findings: Secondary | ICD-10-CM | POA: Diagnosis present

## 2019-05-12 ENCOUNTER — Encounter: Payer: 59 | Attending: Advanced Practice Midwife | Admitting: Registered"

## 2019-05-12 ENCOUNTER — Encounter: Payer: Self-pay | Admitting: Registered"

## 2019-05-12 ENCOUNTER — Other Ambulatory Visit: Payer: Self-pay

## 2019-05-12 DIAGNOSIS — R7309 Other abnormal glucose: Secondary | ICD-10-CM | POA: Diagnosis present

## 2019-05-12 DIAGNOSIS — E1169 Type 2 diabetes mellitus with other specified complication: Secondary | ICD-10-CM | POA: Insufficient documentation

## 2019-05-12 DIAGNOSIS — E669 Obesity, unspecified: Secondary | ICD-10-CM | POA: Diagnosis present

## 2019-05-12 NOTE — Progress Notes (Signed)
Diabetes Self-Management Education  Visit Type: First/Initial  Appt. Start Time: 0940 Appt. End Time: 1115  05/12/2019  Regina Watkins, identified by name and date of birth, is a 34 y.o. female with a diagnosis of Diabetes: Type 2.   ASSESSMENT  There were no vitals taken for this visit. There is no height or weight on file to calculate BMI.   Pt present for appointment alone.   Pt reports she doesn't understand how her hgbA1c was so high (7.1) last time. Reports she wants to have it checked again. Reports she has family members who have diabetes and sometimes she will check on their meters. Reports the last time she checked was in January and she cannot remember what it was but remembered it being within range. Pt does not have a glucometer. Pt sees her gynecologist, does not currently have a PCP. Reports she has been looking for one but has not been able to find anyone who is taking new patients and accepts her insurance. Pt reports she needs to be referred to have a sleep study because she snores and sleep apnea runs in her family.   Noted pt has hx of low iron and last hemoglobin was 11.3. Reports she is not currently taking any iron supplement but has in the past when it was lower. Reports having heavy bleeding with periods.   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 Reports she often skips breakfast due to low appetite or feeling sick if she eats in the morning. Usually wakes around 645 AM but won't usually eat until 10-11 AM if she does eat breakfast. Reports she wants to know what she should and should not eat. Pt unsure if she has had symptoms of low blood sugar-does reports some possible symptoms including tingling in lips, sleepiness, etc. Reports sometimes feeling those symptoms if she hasn't eaten in a while. In regards to hyperglycemia, pt reports urinating often but reports she drinks a lot of water also. Sometimes a whole jug.   Pt reports milk  causes constipation and bloating. Has not yet tried lactose free milk. Pt likes regular yogurt and cheese and reports no GI issues with consumption.  Pt reports no current physical activities but used to walk at a track near her home and go swimming. Reports she would like to start back with walking more.   Pertinent Lab Values: 04/13/19:  HDL Cholesterol: 36 Hemoglobin: 11.3 Hgb A1c: 7.1  Glucose Meter Provided with education: Accu Chek Guide Me #[id number] Expiration: 03/25/20  Typical Snacks: Snap'd Cheez Its or Wheat Thins, fruit, fruit with yogurt, yogurt, smoothies sometimes for breakfast (fruit, yoplait yogurt, almond milk). Eats raisin bran, fruit loops,  cheerios, or Fruity Pebbles.  Diabetes Self-Management Education - 05/12/19 0954      Visit Information   Visit Type  First/Initial      Initial Visit   Diabetes Type  Type 2    Are you currently following a meal plan?  No    Are you taking your medications as prescribed?  Not on Medications    Date Diagnosed  04/2019      Health Coping   How would you rate your overall health?  Fair      Psychosocial Assessment   Patient Belief/Attitude about Diabetes  Motivated to manage diabetes    Self-care barriers  None    Self-management support  Family    Other persons present  Patient    Patient Concerns  Nutrition/Meal planning    Special Needs  None    Preferred Learning Style  No preference indicated    Learning Readiness  Ready    How often do you need to have someone help you when you read instructions, pamphlets, or other written materials from your doctor or pharmacy?  1 - Never    What is the last grade level you completed in school?  12th grade      Pre-Education Assessment   Patient understands the diabetes disease and treatment process.  Needs Instruction    Patient understands incorporating nutritional management into lifestyle.  Needs Instruction    Patient undertands incorporating physical activity into  lifestyle.  Needs Instruction    Patient understands using medications safely.  Demonstrates understanding / competency    Patient understands monitoring blood glucose, interpreting and using results  Needs Instruction    Patient understands prevention, detection, and treatment of acute complications.  Needs Instruction    Patient understands prevention, detection, and treatment of chronic complications.  Needs Instruction    Patient understands how to develop strategies to address psychosocial issues.  Needs Instruction    Patient understands how to develop strategies to promote health/change behavior.  Needs Instruction      Complications   Last HgB A1C per patient/outside source  7.1 %    How often do you check your blood sugar?  0 times/day (not testing)    Fasting Blood glucose range (mg/dL)  --   Pt does not check at home.   Postprandial Blood glucose range (mg/dL)  --   Pt does not check at home.   Number of hypoglycemic episodes per month  0    Number of hyperglycemic episodes per week  --   Unsure-pt does not check at home.   Have you had a dilated eye exam in the past 12 months?  No    Have you had a dental exam in the past 12 months?  Yes    Are you checking your feet?  No      Dietary Intake   Breakfast  930 AM: sausage and gravy over 2 pieces wheat bread    Lunch  None reported.    Snack (afternoon)  Snap'd Cheezit crackers    Dinner  mashed potatoes, hamburger, gravy, broccoli    Beverage(s)  5 bottles water, half cup apple juice      Exercise   Exercise Type  ADL's    How many days per week to you exercise?  0    How many minutes per day do you exercise?  0    Total minutes per week of exercise  0      Patient Education   Previous Diabetes Education  No    Disease state   Definition of diabetes, type 1 and 2, and the diagnosis of diabetes;Factors that contribute to the development of diabetes    Nutrition management   Role of diet in the treatment of diabetes and  the relationship between the three main macronutrients and blood glucose level;Food label reading, portion sizes and measuring food.;Carbohydrate counting;Effects of alcohol on blood glucose and safety factors with consumption of alcohol.    Physical activity and exercise   Role of exercise on diabetes management, blood pressure control and cardiac health.    Monitoring  Taught/evaluated SMBG meter.;Purpose and frequency of SMBG.;Interpreting lab values - A1C, lipid, urine microalbumina.;Daily foot exams;Yearly dilated eye exam;Identified appropriate SMBG and/or A1C goals.    Acute complications  Taught treatment of hypoglycemia - the 15 rule.    Chronic complications  Relationship between chronic complications and blood glucose control;Assessed and discussed foot care and prevention of foot problems;Lipid levels, blood glucose control and heart disease;Dental care;Retinopathy and reason for yearly dilated eye exams;Reviewed with patient heart disease, higher risk of, and prevention    Psychosocial adjustment  Role of stress on diabetes    Preconception care  Role of family planning for patients with diabetes      Individualized Goals (developed by patient)   Nutrition  Follow meal plan discussed;General guidelines for healthy choices and portions discussed    Physical Activity  Exercise 3-5 times per week;30 minutes per day    Monitoring   test my blood glucose as discussed      Post-Education Assessment   Patient understands the diabetes disease and treatment process.  Demonstrates understanding / competency    Patient understands incorporating nutritional management into lifestyle.  Demonstrates understanding / competency    Patient undertands incorporating physical activity into lifestyle.  Demonstrates understanding / competency    Patient understands using medications safely.  Demonstrates understanding / competency    Patient understands monitoring blood glucose, interpreting and using  results  Demonstrates understanding / competency    Patient understands prevention, detection, and treatment of acute complications.  Demonstrates understanding / competency    Patient understands prevention, detection, and treatment of chronic complications.  Demonstrates understanding / competency    Patient understands how to develop strategies to address psychosocial issues.  Demonstrates understanding / competency    Patient understands how to develop strategies to promote health/change behavior.  Demonstrates understanding / competency      Outcomes   Expected Outcomes  Demonstrated interest in learning. Expect positive outcomes    Future DMSE  4-6 wks    Program Status  Completed       Individualized Plan for Diabetes Self-Management Training:   Learning Objective:  Patient will have a greater understanding of diabetes self-management. Patient education plan is to attend individual and/or group sessions per assessed needs and concerns.   Plan: -Dietitian provided DSME and meter instruction. Recommended pt ask her doctor about a referral for sleep study and to see an endocrinologist to manage diabetes. Recommended taking a multivitamin with 18 mg iron due to borderline low hemoglobin. Discussed lactose intolerance and recommended trying lactose free milk. Discussed adding protein to smoothie in the morning for balance. Pt appeared agreeable to information/goals discussed.    Instructions/Goals:   -Have 3 meals per day: Eat a meal every 4-5 hours to maintain blood sugar and prevent lows  -Have consistent amount of carbohydrates at each meal: 2-3 carbohydrate choices (30-45 g carbohydrates) per meal. For snacks, 1 carbohydrate choice (15g) and 1 protein choice (see handout)  -Try 1% milk (lactose free) in place of 2%  -Add a protein source to smoothie (peanut butter, regular or soy milk, or [place] yogurt)  -Recommend trying [place] yogurt and Lactose free milk   -Recommend asking  your gynecologist about a referral to an endocrinologist to manage your diabetes   -Call insurance to ask which glucose meter is covered. Then ask doctor to prescibe refills for strips and lancets.   [email or web address]  Recommend checking fasting first thing in the morning and 1 time 1-2 hours after a meal.   Goals: 80-130 fasting, less than 180 after meal (1-2 hours after)   Make physical activity a part of your week. Try to include at least 30 minutes  of physical activity 5 days each week or at least 150 minutes per week. Regular physical activity promotes overall health-including helping to reduce risk for heart disease and diabetes, promoting mental health, and helping [place] sleep better.     Patient Instructions  Instructions/Goals:   -Have 3 meals per day: Eat a meal every 4-5 hours to maintain blood sugar and prevent lows  -Have consistent amount of carbohydrates at each meal: 2-3 carbohydrate choices (30-45 g carbohydrates) per meal. For snacks, 1 carbohydrate choice (15g) and 1 protein choice (see handout)  -Try 1% milk (lactose free) in place of 2%  -Add a protein source to smoothie (peanut butter, regular or soy milk, or [place] yogurt)  -Recommend trying [place] yogurt and Lactose free milk   -Recommend asking your gynecologist about a referral to an endocrinologist to manage your diabetes   -Call insurance to ask which glucose meter is covered. Then ask doctor to prescibe refills for strips and lancets.   [email or web address]  Recommend checking fasting first thing in the morning and 1 time 1-2 hours after a meal.   Goals: 80-130 fasting, less than 180 after meal (1-2 hours after)   Make physical activity a part of your week. Try to include at least 30 minutes of physical activity 5 days each week or at least 150 minutes per week. Regular physical activity promotes overall health-including helping to reduce risk for heart  disease and diabetes, promoting mental health, and helping [place] sleep better.      Expected Outcomes:  Demonstrated interest in learning. Expect positive outcomes  Education material provided: ADA - How to Thrive: A Guide for Your Journey with Diabetes, My Plate and Snack sheet  If problems or questions, patient to contact team via:  Phone and Email  Future DSME appointment: 4-6 wks

## 2019-05-12 NOTE — Patient Instructions (Addendum)
Instructions/Goals:   -Have 3 meals per day: Eat a meal every 4-5 hours to maintain blood sugar and prevent lows  -Have consistent amount of carbohydrates at each meal: 2-3 carbohydrate choices (30-45 g carbohydrates) per meal. For snacks, 1 carbohydrate choice (15g) and 1 protein choice (see handout)  -Try 1% milk (lactose free) in place of 2%  -Add a protein source to smoothie (peanut butter, regular or soy milk, or [place] yogurt)  -Recommend trying [place] yogurt and Lactose free milk   -Recommend asking your gynecologist about a referral to an endocrinologist to manage your diabetes   -Call insurance to ask which glucose meter is covered. Then ask doctor to prescibe refills for strips and lancets.   [email or web address]  Recommend checking fasting first thing in the morning and 1 time 1-2 hours after a meal.   Goals: 80-130 fasting, less than 180 after meal (1-2 hours after)   Make physical activity a part of your week. Try to include at least 30 minutes of physical activity 5 days each week or at least 150 minutes per week. Regular physical activity promotes overall health-including helping to reduce risk for heart disease and diabetes, promoting mental health, and helping [place] sleep better.

## 2019-05-17 ENCOUNTER — Telehealth (INDEPENDENT_AMBULATORY_CARE_PROVIDER_SITE_OTHER): Payer: 59 | Admitting: Advanced Practice Midwife

## 2019-05-17 DIAGNOSIS — E119 Type 2 diabetes mellitus without complications: Secondary | ICD-10-CM

## 2019-05-17 NOTE — Telephone Encounter (Signed)
Called patient stating I am returning her phone call. Patient states the [facility] she [facility] told her she needed a referral to Endocrinology. Asked patient if she had found a PCP yet and she states no, she's had a hard time finding someone who is taking new patients. Recommended she try Primary Care at [facility] and will mychart their information. Told patient they can manage her diabetes and if not they can refer her to endocrinology but she should start with PCP first. Patient verbalized understanding.

## 2019-05-17 NOTE — Telephone Encounter (Signed)
Patient called in stating that she needs to speak to someone so that she can get a referral to another doctor that the diabetes nutrition told her to see. Patient did not know how to say the doctor's names but knows how to spell it. Patient instructed that a message would be sent to the nurses's and they will reach back out to her. Patient verbalized understanding. Message sent to clinical pool.

## 2019-05-31 ENCOUNTER — Ambulatory Visit: Payer: 59 | Attending: Internal Medicine

## 2019-05-31 ENCOUNTER — Other Ambulatory Visit: Payer: 59

## 2019-05-31 DIAGNOSIS — Z20822 Contact with and (suspected) exposure to covid-19: Secondary | ICD-10-CM

## 2019-06-01 LAB — SARS-COV-2, NAA 2 DAY TAT

## 2019-06-01 LAB — NOVEL CORONAVIRUS, NAA: SARS-CoV-2, NAA: NOT DETECTED

## 2019-06-08 ENCOUNTER — Emergency Department (HOSPITAL_COMMUNITY): Payer: 59

## 2019-06-08 ENCOUNTER — Other Ambulatory Visit: Payer: Self-pay

## 2019-06-08 ENCOUNTER — Emergency Department (HOSPITAL_COMMUNITY)
Admission: EM | Admit: 2019-06-08 | Discharge: 2019-06-08 | Disposition: A | Discharge status: Home / Self Care | Payer: 59 | Attending: Emergency Medicine | Admitting: Emergency Medicine

## 2019-06-08 DIAGNOSIS — W010XXA Fall on same level from slipping, tripping and stumbling without subsequent striking against object, initial encounter: Secondary | ICD-10-CM | POA: Insufficient documentation

## 2019-06-08 DIAGNOSIS — Y9289 Other specified places as the place of occurrence of the external cause: Secondary | ICD-10-CM | POA: Diagnosis not present

## 2019-06-08 DIAGNOSIS — S92354A Nondisplaced fracture of fifth metatarsal bone, right foot, initial encounter for closed fracture: Secondary | ICD-10-CM | POA: Diagnosis not present

## 2019-06-08 DIAGNOSIS — Z91040 Latex allergy status: Secondary | ICD-10-CM | POA: Insufficient documentation

## 2019-06-08 DIAGNOSIS — S99921A Unspecified injury of right foot, initial encounter: Secondary | ICD-10-CM | POA: Diagnosis present

## 2019-06-08 DIAGNOSIS — Z79899 Other long term (current) drug therapy: Secondary | ICD-10-CM | POA: Insufficient documentation

## 2019-06-08 DIAGNOSIS — Y9389 Activity, other specified: Secondary | ICD-10-CM | POA: Insufficient documentation

## 2019-06-08 DIAGNOSIS — Y998 Other external cause status: Secondary | ICD-10-CM | POA: Diagnosis not present

## 2019-06-08 MED ORDER — NAPROXEN 500 MG PO TABS: 500.0000 mg | ORAL_TABLET | Freq: Two times a day (BID) | ORAL | 0 refills | Status: DC

## 2019-06-08 MED ORDER — HYDROCODONE-ACETAMINOPHEN 5-325 MG PO TABS
1.0000 | ORAL_TABLET | Freq: Once | ORAL | Status: AC
  Administered 2019-06-08: 1 via ORAL
  Filled 2019-06-08: qty 1

## 2019-06-08 NOTE — ED Provider Notes (Signed)
[facility]-EMERGENCY DEPT Provider Note   CSN: [id number] Arrival date & time: 06/08/19  2129     History Chief Complaint  Patient presents with  . Foot Pain    Regina Watkins is a 34 y.o. female.  HPI     This is a 34 year old female with a history of diabetes and obesity who presents with right foot pain.  Patient reports that [place] when she misstepped and fell and fell.  She injured her right foot.  This was on Thursday.  She reports increasing pain and swelling to the lateral aspect of the foot.  It is worse with ambulation.  She rates her pain 8 out of 10.  She has been taking Tylenol and ibuprofen with minimal relief.  Denies other injury.  States that she was unable to get her shoe on today.  Past Medical History:  Diagnosis Date  . Anemia   . BV (bacterial vaginosis)   . Ectopic pregnancy   . Infection    UTI  . No pertinent past medical history   . Obesity   . Strep throat   . Trichomonas     Patient Active Problem List   Diagnosis Date Noted  . Elevated hemoglobin A1c 04/14/2019  . Mastalgia 06/11/2011  . 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 History of ectopic pregnancy 06/11/2011    Past Surgical History:  Procedure Laterality Date  . SALPINGECTOMY     removal of ectopic preg and tube     OB History    Gravida  1   Para  0   Term  0   Preterm  0   AB  1   Living  0     SAB  0   TAB  0   Ectopic  1   Multiple  0   Live Births              Family History  Problem Relation Age of Onset  . Heart disease Father        CHF  . Diabetes Maternal Grandmother   . Hypertension Maternal Grandmother   . Cancer Maternal Grandmother        breast  . Cancer Paternal Grandmother   . Hypertension Mother   . Anesthesia problems Neg Hx   . Hypotension Neg Hx   . Malignant hyperthermia Neg Hx   . Pseudochol deficiency Neg Hx     Social History   Tobacco Use  . Smoking status: Never  Smoker  . Smokeless tobacco: Never Used  Substance Use Topics  . Alcohol use: No  . Drug use: No    Home Medications Prior to Admission medications   Medication Sig Start Date End Date Taking? Authorizing Provider  acetaminophen (TYLENOL) 325 MG tablet Take 650 mg by mouth every 6 (six) hours as needed for moderate pain.    [provider]  megestrol (MEGACE) 40 MG tablet Take 3 tablets (120 mg total) by mouth daily. 05/29/18   Leftwich-Kirby, Wilmer Floor, CNM  methocarbamol (ROBAXIN) 500 MG tablet Take 1 tablet (500 mg total) by mouth 2 (two) times daily. Patient not taking: Reported on 04/13/2019 10/21/18   Harlene Salts A, PA-C  metroNIDAZOLE (FLAGYL) 500 MG tablet Take 1 tablet (500 mg total) by mouth 2 (two) times daily. 11/18/17   Calvert Cantor, CNM  multivitamin-iron-minerals-folic acid (CENTRUM) chewable tablet Chew 1 tablet by mouth daily. Patient not taking: Reported on 10/24/2016 01/03/16  Marylene Land, CNM  naproxen (NAPROSYN) 500 MG tablet Take 1 tablet (500 mg total) by mouth 2 (two) times daily. 06/08/19   Jaryiah Mehlman, Mayer Masker, MD  promethazine (PHENERGAN) 25 MG tablet Take 1 tablet (25 mg total) by mouth every 6 (six) hours as needed for nausea or vomiting. Patient not taking: Reported on 04/13/2019 04/28/18   Ward, Layla Maw, DO  zinc gluconate 50 MG tablet Take 50 mg by mouth daily.    [provider]    Allergies    Latex  Review of Systems   Review of Systems  Musculoskeletal:       Right foot pain  Neurological: Negative for weakness and numbness.  All other systems reviewed and are negative.   Physical Exam Updated Vital Signs BP (!) 145/113 (BP Location: Left Arm)   Pulse 91   Temp 98.1 F (36.7 C) (Oral)   Resp 16   Ht 1.676 m (5\' 6" )   Wt 131.5 kg   LMP 05/19/2019   SpO2 100%   BMI 46.81 kg/m   Physical Exam Vitals and nursing note reviewed.  Constitutional:      Appearance: She is well-developed. She is obese. She is  not ill-appearing.  HENT:     Head: Normocephalic and atraumatic.  Cardiovascular:     Rate and Rhythm: Normal rate and regular rhythm.  Pulmonary:     Effort: Pulmonary effort is normal. No respiratory distress.  Musculoskeletal:     Cervical back: Neck supple.     Comments: Tenderness to palpation over the distal aspect of the right fifth metatarsal, no obvious deformities, slight swelling noted with faint bruising just along the proximal aspect of the digits, no midfoot pain or tenderness, 2+ DP pulse  Skin:    General: Skin is warm and dry.  Neurological:     Mental Status: She is alert and oriented to person, place, and time.  Psychiatric:        Mood and Affect: Mood normal.     ED Results / Procedures / Treatments   Labs (all labs ordered are listed, but only abnormal results are displayed) Labs Reviewed - No data to display  EKG None  Radiology DG Foot Complete Right  Result Date: 06/08/2019 CLINICAL DATA:  34 year old female with trauma to the right foot. EXAM: RIGHT FOOT COMPLETE - 3+ VIEW COMPARISON:  Right foot radiograph dated 09/02/2013. FINDINGS: There is a minimally displaced oblique fracture of the fifth metatarsal head. No other acute fracture identified. There is no dislocation. Minimal spurring of the dorsal tarsal bones. The soft tissues are unremarkable. IMPRESSION: Minimally displaced oblique fracture of the fifth metatarsal head. Electronically Signed   By: 11/03/2013 M.D.   On: 06/08/2019 22:14    Procedures Procedures (including critical care time)  Medications Ordered in ED Medications  HYDROcodone-acetaminophen (NORCO/VICODIN) 5-325 MG per tablet 1 tablet (1 tablet Oral Provided for home use 06/08/19 2328)    ED Course  I have reviewed the triage vital signs and the nursing notes.  Pertinent labs & imaging results that were available during my care of the patient were reviewed by me and considered in my medical decision making (see chart for  details).    MDM Rules/Calculators/A&P                       Patient presents with worsening right foot swelling after a fall on Thursday.  She is neurovascular intact.  Vital signs are reviewed  and reassuring.  X-rays show a minimally displaced oblique fracture of the fifth metatarsal.  I have reviewed the x-rays independently.  Feel she is a candidate for postop shoe or walking boot.  She reports significant pain with ambulation.  She was given a cam walker.  Recommend ice and elevation when not ambulating.  Patient was given orthopedic follow-up  After history, exam, and medical workup I feel the patient has been appropriately medically screened and is safe for discharge home. Pertinent diagnoses were discussed with the patient. Patient was given return precautions.   Final Clinical Impression(s) / ED Diagnoses Final diagnoses:  Closed nondisplaced fracture of fifth metatarsal bone of right foot, initial encounter    Rx / DC Orders ED Discharge Orders         Ordered    naproxen (NAPROSYN) 500 MG tablet  2 times daily     06/08/19 2325           Merryl Hacker, MD 06/08/19 [phone]

## 2019-06-08 NOTE — Discharge Instructions (Addendum)
You were seen today and found to have a foot fracture.  You may ambulate as tolerated with boot.  Keep iced and elevated when not ambulating.  Take naproxen as needed for pain.  Follow-up with orthopedics in the next 1 to 2 weeks.

## 2019-06-08 NOTE — ED Triage Notes (Signed)
Arrived POv from home. Patient reports while in [place] she slipped in hot tub on Thursday injuring her right foot. Patient states it seems like the pain is getting worse instead of better.

## 2019-06-09 ENCOUNTER — Ambulatory Visit: Payer: 59 | Admitting: Registered"

## 2019-07-20 ENCOUNTER — Other Ambulatory Visit: Payer: Self-pay | Admitting: General Practice

## 2019-07-20 ENCOUNTER — Encounter: Payer: Self-pay | Admitting: General Practice

## 2019-07-21 ENCOUNTER — Telehealth: Payer: Self-pay

## 2019-07-21 ENCOUNTER — Telehealth: Payer: Self-pay | Admitting: Obstetrics and Gynecology

## 2019-07-21 MED ORDER — MEGESTROL ACETATE 40 MG PO TABS: 120.0000 mg | ORAL_TABLET | Freq: Every day | ORAL | 0 refills | Status: DC

## 2019-07-21 NOTE — Patient Instructions (Signed)
Thank you for choosing Primary Care at [facility] to be your medical home!    Aislee MARLINDA MIRANDA was seen by De Hollingshead, DO today.   [name] T Baskette's primary care provider is Marcy Siren, DO.   For the best care possible, you should try to see Marcy Siren, DO whenever you come to the clinic.   We look forward to seeing you again soon!  If you have any questions about your visit today, please call us at [phone] or feel free to reach your primary care provider via MyChart.

## 2019-07-21 NOTE — Telephone Encounter (Signed)

## 2019-07-21 NOTE — Telephone Encounter (Signed)
Patient is requesting a call back. She has been requesting for a refill, and no one has filled it.

## 2019-07-21 NOTE — Telephone Encounter (Signed)
Called pt and pt requested to have a refill on her Megace.  Pt reports that she is having a lot of bleeding and would like for it to stop.  Pt stated that she had spoken to someone earlier who informed her that she needed to have an appt before getting refill.  Pt states that she was just seen in February in our office.  Per chart review pt last Megace prescription was in 10/2018 for Megace 120 mg po daily.  Pt confirmed that she last took that dose as well.    I explained to the pt that who she spoke to is correct she does need an appt scheduled with a provider however I can ask if she can a get one month/one time refill on her Megace.  Per Dr. Vergie Living pt can have one month supply for Megace 120 mg po daily so she can get an appt.  Notified pt.  Pt verbalized understanding with no further questions.  Message sent to the front office to call pt with an appt.    Addison Naegeli, RN

## 2019-07-22 ENCOUNTER — Ambulatory Visit (INDEPENDENT_AMBULATORY_CARE_PROVIDER_SITE_OTHER): Payer: 59 | Admitting: Internal Medicine

## 2019-07-22 ENCOUNTER — Other Ambulatory Visit: Payer: Self-pay

## 2019-07-22 VITALS — BP 110/78 | HR 98 | Temp 97.3°F | Resp 17 | Ht 66.0 in | Wt 285.0 lb

## 2019-07-22 DIAGNOSIS — R0683 Snoring: Secondary | ICD-10-CM

## 2019-07-22 DIAGNOSIS — S92351S Displaced fracture of fifth metatarsal bone, right foot, sequela: Secondary | ICD-10-CM

## 2019-07-22 DIAGNOSIS — E119 Type 2 diabetes mellitus without complications: Secondary | ICD-10-CM | POA: Diagnosis not present

## 2019-07-22 DIAGNOSIS — Z7689 Persons encountering health services in other specified circumstances: Secondary | ICD-10-CM | POA: Diagnosis not present

## 2019-07-22 LAB — POCT GLYCOSYLATED HEMOGLOBIN (HGB A1C): Hemoglobin A1C: 6 % — AB (ref 4.0–5.6)

## 2019-07-22 NOTE — Progress Notes (Signed)
Subjective:    Regina Watkins - 34 y.o. female MRN [id number]  Date of birth: 1985-11-18  HPI  Regina Watkins is to establish care. Patient has a PMH significant for new onset T2DM (diagnosed at [facility] annual exam Feb 2021). She has seen a nutritionist one time and is working on diet/exercise changes. Was not started on any medications at that visit.   Has concerns for OSA. Her father has it. She snores a lot at night. Denies periods of observed apnea or waking gasping for air. Does have morning headaches occasionally.     ROS per HPI     Health Maintenance:  Health Maintenance Due  Topic Date Due  . PNEUMOCOCCAL POLYSACCHARIDE VACCINE AGE 18-64 HIGH RISK  Never done  . FOOT EXAM  Never done  . OPHTHALMOLOGY EXAM  Never done  . URINE MICROALBUMIN  Never done  . COVID-19 Vaccine (1) Never done     Past Medical History: Patient Active Problem List   Diagnosis Date Noted  . New onset type 2 diabetes mellitus ([place]) 07/22/2019  . Mastalgia 06/11/2011  . 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 History of ectopic pregnancy 06/11/2011      Social History   reports that she has never smoked. She has never used smokeless tobacco. She reports that she does not drink alcohol or use drugs.   Family History  family history includes Cancer in her maternal grandmother and paternal grandmother; Diabetes in her maternal grandmother; Heart disease in her father; Hypertension in her maternal grandmother and mother.   Medications: reviewed and updated   Objective:   Physical Exam BP 110/78   Pulse 98   Temp (!) 97.3 F (36.3 C) (Temporal)   Resp 17   Ht 5\' 6"  (1.676 m)   Wt 285 lb (129.3 kg)   SpO2 96%   BMI 46.00 kg/m  Physical Exam  Constitutional: She is oriented to person, place, and time and well-developed, well-nourished, and in no distress. No distress.  HENT:  Head: Normocephalic and atraumatic.  Eyes: Conjunctivae and EOM are normal.  Cardiovascular: Normal  rate, regular rhythm and normal heart sounds.  No murmur heard. Pulmonary/Chest: Effort normal and breath sounds normal. No respiratory distress.  Musculoskeletal:        General: Normal range of motion.  Neurological: She is alert and oriented to person, place, and time.  Skin: Skin is warm and dry. She is not diaphoretic.  Psychiatric: Affect and judgment normal.        Assessment & Plan:    1. Encounter to establish care Reviewed patient's PMH, social history, surgical history, and medications.  She recently had annual exam with OB-GYN provider.   2. New onset type 2 diabetes mellitus (HCC) A1c is 6.0% today. Discussed that this is consistent with prediabetes. Continue to follow with nutritionist and work on lifestyle changes. Will monitor for progression. Return in 3 months for repeat A1c.  - HgB A1c - Microalbumin/Creatinine Ratio, Urine  3. Snoring Warrants evaluation for sleep apnea given obesity, snoring, morning headaches.  - Split night study; Future  4. Morbid obesity ([place]) Followed by nutrition.  - Split night study; Future  5. Fracture of base of fifth metatarsal bone of right foot, sequela Patient seen in ED on 4/6 found to have a minimally displaced oblique fracture of the fifth metatarsal. She has been wearing bot but reports that it is causing her foot to swell. She never received phone call to f/u with orthopedics after  ER and requests referral.  - Ambulatory referral to Orthopedic Surgery    Marcy Siren, D.O. 07/22/2019, 3:48 PM Primary Care at [facility]

## 2019-07-23 LAB — MICROALBUMIN / CREATININE URINE RATIO
Creatinine, Urine: 123.7 mg/dL
Microalb/Creat Ratio: 11 mg/g creat (ref 0–29)
Microalbumin, Urine: 13.5 ug/mL

## 2019-07-26 ENCOUNTER — Encounter: Payer: Self-pay | Admitting: Orthopedic Surgery

## 2019-07-26 ENCOUNTER — Ambulatory Visit (INDEPENDENT_AMBULATORY_CARE_PROVIDER_SITE_OTHER): Payer: 59

## 2019-07-26 ENCOUNTER — Other Ambulatory Visit: Payer: Self-pay

## 2019-07-26 ENCOUNTER — Ambulatory Visit (INDEPENDENT_AMBULATORY_CARE_PROVIDER_SITE_OTHER): Payer: 59 | Admitting: Orthopedic Surgery

## 2019-07-26 VITALS — Ht 66.0 in | Wt 282.0 lb

## 2019-07-26 DIAGNOSIS — S92354A Nondisplaced fracture of fifth metatarsal bone, right foot, initial encounter for closed fracture: Secondary | ICD-10-CM

## 2019-07-26 DIAGNOSIS — M79671 Pain in right foot: Secondary | ICD-10-CM

## 2019-07-26 NOTE — Progress Notes (Signed)
Office Visit Note   Patient: Regina Watkins           Date of Birth: 1985-03-15           MRN: [id number] Visit Date: 07/26/2019              Requested by: Arvilla Market, DO [address] [place],  [place] [address] PCP: Patient, No Pcp Per  Chief Complaint  Patient presents with  . Right Foot - Pain, Injury    DOI 06/03/2019      HPI: Patient is a 34 year old woman who is seen for initial evaluation for fifth metatarsal neck fracture right foot.  Patient sustained the injury 7 weeks ago.  She states that she had increased pain and swelling when she was in the cam walker.  Assessment & Plan: Visit Diagnoses:  1. Pain in right foot   2. Nondisplaced fracture of fifth metatarsal bone, right foot, initial encounter for closed fracture     Plan: Patient will increase her activities as tolerated work on Achilles stretching stiff soled shoes.  Follow-Up Instructions: No follow-ups on file.   Ortho Exam  Patient is alert, oriented, no adenopathy, well-dressed, normal affect, normal respiratory effort. Examination patient has a normal gait she has good pulses she has good range of motion the ankle and subtalar joint the metatarsal is nontender to palpation.  No skin ulcers.  Imaging: XR Foot Complete Right  Result Date: 07/26/2019 Three-view radiographs of the right foot shows good callus formation at the fifth metatarsal neck fracture no other fractures.  No images are attached to the encounter.  Labs: Lab Results  Component Value Date   HGBA1C 6.0 (A) 07/22/2019   HGBA1C 7.1 (H) 04/13/2019   REPTSTATUS 08/24/2014 FINAL 08/21/2014   CULT  08/21/2014    No Herpes Simplex Virus detected. Performed at [facility]    LABORGA ESCHERICHIA COLI 08/08/2013     Lab Results  Component Value Date   ALBUMIN 4.1 04/13/2019    Lab Results  Component Value Date   MG 1.9 04/27/2018   No results found for: VD25OH  No results found for:  PREALBUMIN CBC EXTENDED Latest Ref Rng & Units 04/13/2019 04/27/2018 03/01/2017  WBC 3.4 - 10.8 x10E3/uL 9.6 10.9(H) 10.7(H)  RBC 3.77 - 5.28 x10E6/uL 4.85 4.59 4.54  HGB 11.1 - 15.9 g/dL 19.1 47.8 11.7(L)  HCT 34.0 - 46.6 % 35.8 37.5 35.1(L)  PLT 150 - 450 x10E3/uL 393 336 354     Body mass index is 45.52 kg/m.  Orders:  Orders Placed This Encounter  Procedures  . XR Foot Complete Right   No orders of the defined types were placed in this encounter.    Procedures: No procedures performed  Clinical Data: No additional findings.  ROS:  All other systems negative, except as noted in the HPI. Review of Systems  Objective: Vital Signs: Ht 5\' 6"  (1.676 m)   Wt 282 lb (127.9 kg)   BMI 45.52 kg/m   Specialty Comments:  No specialty comments available.  PMFS History: Patient Active Problem List   Diagnosis Date Noted  . New onset type 2 diabetes mellitus (HCC) 07/22/2019  . Mastalgia 06/11/2011  . 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 History of ectopic pregnancy 06/11/2011   Past Medical History:  Diagnosis Date  . Anemia   . BV (bacterial vaginosis)   . Ectopic pregnancy   . Infection    UTI  . No pertinent past medical history   .  Obesity   . Strep throat   . Trichomonas     Family History  Problem Relation Age of Onset  . Heart disease Father        CHF  . Diabetes Maternal Grandmother   . Hypertension Maternal Grandmother   . Cancer Maternal Grandmother        breast  . Cancer Paternal Grandmother   . Hypertension Mother   . Anesthesia problems Neg Hx   . Hypotension Neg Hx   . Malignant hyperthermia Neg Hx   . Pseudochol deficiency Neg Hx     Past Surgical History:  Procedure Laterality Date  . SALPINGECTOMY     removal of ectopic preg and tube   Social History   Occupational History  . Not on file  Tobacco Use  . Smoking status: Never Smoker  . Smokeless tobacco: Never Used  Substance and Sexual Activity  . Alcohol use: No  .  Drug use: No  . Sexual activity: Yes    Birth control/protection: Condom    Comment: monogamous rlship wiht female partner of 8 years; last intercourse one month ago

## 2019-07-27 NOTE — Progress Notes (Signed)
Patient notified of results via MyChart. 

## 2019-07-28 ENCOUNTER — Other Ambulatory Visit: Payer: Self-pay | Admitting: Internal Medicine

## 2019-07-28 ENCOUNTER — Telehealth: Payer: Self-pay | Admitting: General Practice

## 2019-07-28 DIAGNOSIS — R0683 Snoring: Secondary | ICD-10-CM

## 2019-07-28 NOTE — Telephone Encounter (Signed)
Patient notified. Aware the Sleep Center will be reaching out to her about the home sleep study.

## 2019-07-28 NOTE — Telephone Encounter (Signed)
I looked over the paperwork from the Sleep Center. Insurance prefers an at home sleep study. Order has been pended.

## 2019-07-28 NOTE — Telephone Encounter (Signed)
Order has been signed.   Marcy Siren, D.O. Primary Care at [facility]  07/28/2019, 11:09 AM

## 2019-07-28 NOTE — Telephone Encounter (Signed)
Insurance deined sleep study stated you can appeal it sleep center is faxing the paperwork

## 2019-08-26 ENCOUNTER — Other Ambulatory Visit (HOSPITAL_COMMUNITY)
Admission: RE | Admit: 2019-08-26 | Discharge: 2019-08-26 | Disposition: A | Discharge status: Home / Self Care | Payer: 59 | Source: Ambulatory Visit | Attending: Obstetrics & Gynecology | Admitting: Obstetrics & Gynecology

## 2019-08-26 ENCOUNTER — Other Ambulatory Visit: Payer: Self-pay

## 2019-08-26 ENCOUNTER — Ambulatory Visit (INDEPENDENT_AMBULATORY_CARE_PROVIDER_SITE_OTHER): Payer: 59 | Admitting: Obstetrics & Gynecology

## 2019-08-26 ENCOUNTER — Encounter: Payer: Self-pay | Admitting: Obstetrics & Gynecology

## 2019-08-26 VITALS — BP 153/84 | HR 98 | Wt 280.8 lb

## 2019-08-26 DIAGNOSIS — R300 Dysuria: Secondary | ICD-10-CM

## 2019-08-26 DIAGNOSIS — N898 Other specified noninflammatory disorders of vagina: Secondary | ICD-10-CM

## 2019-08-26 DIAGNOSIS — R350 Frequency of micturition: Secondary | ICD-10-CM | POA: Diagnosis not present

## 2019-08-26 DIAGNOSIS — N970 Female infertility associated with anovulation: Secondary | ICD-10-CM

## 2019-08-26 LAB — POCT URINALYSIS DIP (DEVICE)
Bilirubin Urine: NEGATIVE
Glucose, UA: NEGATIVE mg/dL
Ketones, ur: NEGATIVE mg/dL
Nitrite: NEGATIVE
Protein, ur: NEGATIVE mg/dL
Specific Gravity, Urine: >=1.03 (ref 1.005–1.030)
Urobilinogen, UA: 1 mg/dL (ref 0.0–1.0)
pH: 5.5 (ref 5.0–8.0)

## 2019-08-26 NOTE — Patient Instructions (Signed)
Acute Urinary Retention, Female  Acute urinary retention means that you cannot pee (urinate) at all, or that you pee too little and your bladder is not emptied completely. If it is not treated, it can lead to kidney damage or other serious problems. Follow these instructions at home:  Take over-the-counter and prescription medicines only as told by your doctor. Ask your doctor what medicines you should stay away from. Do not take any medicine unless your doctor says it is okay to do so.  If you were sent home with a tube that drains pee from the bladder (catheter), take care of it as told by your doctor.  Drink enough fluid to keep your pee clear or pale yellow.  If you were given an antibiotic, take it as told by your doctor. Do not stop taking the antibiotic even if you start to feel better.  Do not use any products that contain nicotine or tobacco, such as cigarettes and e-cigarettes. If you need help quitting, ask your doctor.  Watch for changes in your symptoms. Tell your doctor about them.  If told, keep track of any changes in your blood pressure at home. Tell your doctor about them.  Keep all follow-up visits as told by your doctor. This is important. Contact a doctor if:  You have spasms or you leak pee when you have spasms. Get help right away if:  You have chills or a fever.  You have blood in your pee.  You have a tube that drains the bladder and: ? The tube stops draining pee. ? The tube falls out. Summary  Acute urinary retention means that you cannot pee at all, or that you pee too little and your bladder is not emptied completely. If it is not treated, it can result in kidney damage or other serious problems.  If you were sent home with a tube that drains pee from the bladder, take care of it as told by your doctor.  Pay attention to any changes in your symptoms. Tell your doctor about them. This information is not intended to replace advice given to you by your  health care provider. Make sure you discuss any questions you have with your health care provider. Document Revised: 01/31/2017 Document Reviewed: 03/22/2016 Elsevier Patient Education  2020 Elsevier Inc.  

## 2019-08-26 NOTE — Progress Notes (Signed)
Reports urinary frequency and burning with urination. Reports vaginal itching. Pt reports taking Megace if period does not end after 2 weeks.   Pt has been trying for pregnancy for 2 years. Last pregnancy was 2009; ectopic pregnancy. Referral sent to [facility] per L. Mayford Knife, MD.   Fleet Contras RN 08/26/19

## 2019-08-26 NOTE — Progress Notes (Signed)
Regina Watkins is an 34 y.o. female. Pt has h/o menorrhagia, it has been improved  With Megace. Pt does not want to continue hormone because she desires to conceive.  Her partner has 2 children from prior relationship. She has been pregnant once and had a miscarriage.   Menstrual History: Menarche age: 62 No LMP recorded. (Menstrual status: Irregular Periods).    Past Medical History:  Diagnosis Date  . Anemia   . BV (bacterial vaginosis)   . Ectopic pregnancy   . Infection    UTI  . No pertinent past medical history   . Obesity   . Strep throat   . Trichomonas     Past Surgical History:  Procedure Laterality Date  . SALPINGECTOMY     removal of ectopic preg and tube    Family History  Problem Relation Age of Onset  . Heart disease Father        CHF  . Diabetes Maternal Grandmother   . Hypertension Maternal Grandmother   . Cancer Maternal Grandmother        breast  . Cancer Paternal Grandmother   . Hypertension Mother   . Anesthesia problems Neg Hx   . Hypotension Neg Hx   . Malignant hyperthermia Neg Hx   . Pseudochol deficiency Neg Hx     Social History:  reports that she has never smoked. She has never used smokeless tobacco. She reports that she does not drink alcohol and does not use drugs.  Allergies:  Allergies  Allergen Reactions  . Latex Other (See Comments)    Latex condoms cause bacterial infections and irritation.    (Not in a hospital admission)   Review of Systems  Constitutional: Negative for activity change, appetite change, fatigue and unexpected weight change.  HENT: Negative.   Eyes: Negative.   Respiratory: Negative.  Negative for chest tightness, shortness of breath and wheezing.   Cardiovascular: Negative.  Negative for chest pain and leg swelling.  Gastrointestinal: Negative.  Negative for abdominal distention, abdominal pain, constipation, diarrhea, nausea and vomiting.  Endocrine: Negative.   Genitourinary: Negative.  Negative  for dysuria and hematuria.  Neurological: Negative.  Negative for dizziness, weakness, light-headedness, numbness and headaches.  Hematological: Negative.   Psychiatric/Behavioral: Negative.  Negative for agitation, confusion and decreased concentration.    Blood pressure (!) 153/84, pulse 98, weight 280 lb 12.8 oz (127.4 kg). Physical Exam  Vitals reviewed. Constitutional: She is oriented to person, place, and time. She appears well-developed.  HENT:  Head: Normocephalic and atraumatic.  Eyes: Pupils are equal, round, and reactive to light.  Cardiovascular: Normal rate.  Respiratory: Effort normal.  GI: Soft. She exhibits no distension. There is no abdominal tenderness. There is no guarding.  Musculoskeletal:     Cervical back: Normal range of motion.  Neurological: She is alert and oriented to person, place, and time.  Skin: Skin is warm and dry.  Psychiatric: Her behavior is normal.    Results for orders placed or performed in visit on 08/26/19 (from the past 24 hour(s))  POCT urinalysis dip (device)     Status: Abnormal   Collection Time: 08/26/19  3:43 PM  Result Value Ref Range   Glucose, UA NEGATIVE NEGATIVE mg/dL   Bilirubin Urine NEGATIVE NEGATIVE   Ketones, ur NEGATIVE NEGATIVE mg/dL   Specific Gravity, Urine >=1.030 1.005 - 1.030   Hgb urine dipstick TRACE (A) NEGATIVE   pH 5.5 5.0 - 8.0   Protein, ur NEGATIVE NEGATIVE mg/dL  Urobilinogen, UA 1.0 0.0 - 1.0 mg/dL   Nitrite NEGATIVE NEGATIVE   Leukocytes,Ua SMALL (A) NEGATIVE   Patient had unprotected intercourse for about a year with no conception.  She reported normal menstrual periods.   Patient also advised about LH/ovulation predictor kits, also advised to have timed intercourse at least every other day around the time of ovulation.  Will check and follow up on infertility workup labs, next step will be to do semen analysis then HSG.  Will continue to follow.   Cherre Blanc 08/26/2019, 5:16 PM

## 2019-08-27 LAB — CERVICOVAGINAL ANCILLARY ONLY
Bacterial Vaginitis (gardnerella): NEGATIVE
Candida Glabrata: NEGATIVE
Candida Vaginitis: POSITIVE — AB
Chlamydia: NEGATIVE
Comment: NEGATIVE
Comment: NEGATIVE
Comment: NEGATIVE
Comment: NEGATIVE
Comment: NEGATIVE
Comment: NORMAL
Neisseria Gonorrhea: NEGATIVE
Trichomonas: NEGATIVE

## 2019-08-28 LAB — URINE CULTURE

## 2019-08-31 ENCOUNTER — Telehealth (INDEPENDENT_AMBULATORY_CARE_PROVIDER_SITE_OTHER): Payer: 59 | Admitting: Lactation Services

## 2019-08-31 DIAGNOSIS — N390 Urinary tract infection, site not specified: Secondary | ICD-10-CM

## 2019-08-31 DIAGNOSIS — B9620 Unspecified Escherichia coli [E. coli] as the cause of diseases classified elsewhere: Secondary | ICD-10-CM

## 2019-08-31 MED ORDER — AMOXICILLIN-POT CLAVULANATE 875-125 MG PO TABS: 1.0000 | ORAL_TABLET | Freq: Two times a day (BID) | ORAL | 0 refills | Status: DC

## 2019-08-31 MED ORDER — FLUCONAZOLE 150 MG PO TABS: 150.0000 mg | ORAL_TABLET | Freq: Once | ORAL | 0 refills | Status: AC

## 2019-08-31 NOTE — Telephone Encounter (Signed)
Chart reviewed for nurse visit. Agree with plan of care.   Colden Samaras L, DO 08/31/2019 4:57 PM

## 2019-08-31 NOTE — Telephone Encounter (Signed)
Patient called in with questions about her lab results.   Patient labs consistent with E Coli UTI and vaginal yeast infection. Advised patient to take ATB first and then take Diflucan for the yeast infection.   Pt was informed to take the Diflucan after finishing the ATB.   Antibiotics and Diflucan ordered. LM for patient that prescriptions were sent to her pharmacy.

## 2019-09-01 ENCOUNTER — Telehealth (INDEPENDENT_AMBULATORY_CARE_PROVIDER_SITE_OTHER): Payer: 59 | Admitting: General Practice

## 2019-09-01 DIAGNOSIS — B379 Candidiasis, unspecified: Secondary | ICD-10-CM

## 2019-09-01 DIAGNOSIS — N390 Urinary tract infection, site not specified: Secondary | ICD-10-CM

## 2019-09-01 NOTE — Telephone Encounter (Signed)
Patient called into front office stating she spoke with someone yesterday about her results and was told she had a UTI & a yeast infection. Patient states she was supposed to get a callback about medications being sent in but never did. Told patient it appears someone called her back and left a voicemail regarding her medications. Informed patient of prescriptions and directions of use. Patient verbalized understanding & had no questions.

## 2019-09-12 MED ORDER — FLUCONAZOLE 150 MG PO TABS: 150.0000 mg | ORAL_TABLET | ORAL | 1 refills | Status: AC

## 2019-09-12 MED ORDER — NITROFURANTOIN MONOHYD MACRO 100 MG PO CAPS: 100.0000 mg | ORAL_CAPSULE | Freq: Two times a day (BID) | ORAL | 0 refills | Status: AC

## 2019-09-29 ENCOUNTER — Other Ambulatory Visit: Payer: 59

## 2019-10-12 ENCOUNTER — Other Ambulatory Visit: Payer: Self-pay

## 2019-10-12 ENCOUNTER — Other Ambulatory Visit: Payer: Self-pay | Admitting: Obstetrics and Gynecology

## 2019-10-12 ENCOUNTER — Telehealth: Payer: Self-pay | Admitting: Obstetrics & Gynecology

## 2019-10-12 ENCOUNTER — Ambulatory Visit (HOSPITAL_BASED_OUTPATIENT_CLINIC_OR_DEPARTMENT_OTHER): Payer: 59 | Attending: Internal Medicine | Admitting: Internal Medicine

## 2019-10-12 VITALS — Ht 66.0 in | Wt 280.0 lb

## 2019-10-12 DIAGNOSIS — R0683 Snoring: Secondary | ICD-10-CM

## 2019-10-12 DIAGNOSIS — Z6841 Body Mass Index (BMI) 40.0 and over, adult: Secondary | ICD-10-CM | POA: Diagnosis not present

## 2019-10-12 DIAGNOSIS — G4733 Obstructive sleep apnea (adult) (pediatric): Secondary | ICD-10-CM | POA: Insufficient documentation

## 2019-10-12 NOTE — Telephone Encounter (Signed)
Patient called into the office wanting to know who the last doctor was that she seen. Patient was given this information Mayford Knife). Patient stated that she was suppose to have a refill on her megace this day by the pharmacy is stating they have not received anything. Patient instructed that a message will be sent to the nurses at this. Patient verbalized understanding.

## 2019-10-13 ENCOUNTER — Other Ambulatory Visit: Payer: Self-pay

## 2019-10-13 MED ORDER — MEGESTROL ACETATE 40 MG PO TABS: 120.0000 mg | ORAL_TABLET | Freq: Every day | ORAL | 0 refills | Status: DC

## 2019-10-13 NOTE — Telephone Encounter (Signed)
Called patient to let her know that Megace prescription has been refilled. Patient did not answer, LM that prescription was sent to Pharmacy.

## 2019-10-16 DIAGNOSIS — R0683 Snoring: Secondary | ICD-10-CM

## 2019-10-16 NOTE — Procedures (Signed)
   Patient Name: Regina Watkins, Regina Watkins Date: 10/13/2019 Gender: Female D.O.B: December 27, 1985 Age (years): 33 Referring Provider: De Hollingshead DO Height (inches): 66 Interpreting Physician: Jetty Duhamel MD, ABSM Weight (lbs): 285 RPSGT: [place] S[place] BMI: 46 MRN: [id number] Neck Size: 14.00  CLINICAL INFORMATION Sleep Study Type: HST Indication for sleep study: OSA Epworth Sleepiness Score: 7  SLEEP STUDY TECHNIQUE A multi-channel overnight portable sleep study was performed. The channels recorded were: nasal airflow, thoracic respiratory movement, and oxygen saturation with a pulse oximetry. Snoring was also monitored.  MEDICATIONS Patient self administered medications include: none reported.  SLEEP ARCHITECTURE Patient was studied for 363.2 minutes. The sleep efficiency was 100.0 % and the patient was supine for 98.6%. The arousal index was 0.0 per hour.  RESPIRATORY PARAMETERS The overall AHI was 17.0 per hour, with a central apnea index of 0.0 per hour. The oxygen nadir was 82% during sleep.  CARDIAC DATA Mean heart rate during sleep was 78.0 bpm.  IMPRESSIONS - Moderate obstructive sleep apnea occurred during this study (AHI = 17.0/h). - No significant central sleep apnea occurred during this study (CAI = 0.0/h). - Oxygen desaturation was noted during this study (Min O2 = 82%). Mean sat 94% - Patient snored.  DIAGNOSIS - Obstructive Sleep Apnea (G47.33)  RECOMMENDATIONS - Suggest CPAP titration sleep study or autopap. Other options would be based on clinical judgment. - Be careful with alcohol, sedatives and other CNS depressants that may worsen sleep apnea and disrupt normal sleep architecture. - Sleep hygiene should be reviewed to assess factors that may improve sleep quality. - Weight management and regular exercise should be initiated or continued. -  [Electronically signed] 10/16/2019 11:22 AM  Jetty Duhamel MD, ABSM Diplomate, American Board  of Sleep Medicine   NPI: [id number]                          Jetty Duhamel Diplomate, American Board of Sleep Medicine  ELECTRONICALLY SIGNED ON:  10/16/2019, 11:19 AM [facility] PH: [phone]   FX: [phone] ACCREDITED BY THE AMERICAN ACADEMY OF SLEEP MEDICINE

## 2019-10-25 ENCOUNTER — Telehealth (INDEPENDENT_AMBULATORY_CARE_PROVIDER_SITE_OTHER): Payer: 59 | Admitting: Internal Medicine

## 2019-10-25 DIAGNOSIS — R7303 Prediabetes: Secondary | ICD-10-CM | POA: Diagnosis not present

## 2019-10-25 DIAGNOSIS — G4733 Obstructive sleep apnea (adult) (pediatric): Secondary | ICD-10-CM | POA: Diagnosis not present

## 2019-10-25 NOTE — Progress Notes (Signed)
Virtual Visit via Telephone Note  I connected with Regina Watkins, on 10/25/2019 at 1:44 PM by telephone due to the COVID-19 pandemic and verified that I am speaking with the correct person using two identifiers.   Consent: I discussed the limitations, risks, security and privacy concerns of performing an evaluation and management service by telephone and the availability of in person appointments. I also discussed with the patient that there may be a patient responsible charge related to this service. The patient expressed understanding and agreed to proceed.   Location of Patient: Home   Location of Provider: Clinic    Persons participating in Telemedicine visit: [facility] Dr. Earlene Plater      History of Present Illness: Patient has a visit for follow up prediabetes. Last A1c 6.0. Has been trying to limit sugar intake but has been difficult because that is her weakness. Has been limiting breads, pasta, etc. Walks track daily, has been doing YouTube exercise videos as well.   Wants to discuss options for weight management including diet/exercise, medications, bariatric surgery.    Past Medical History:  Diagnosis Date  . Anemia   . BV (bacterial vaginosis)   . Ectopic pregnancy   . Infection    UTI  . No pertinent past medical history   . Obesity   . Strep throat   . Trichomonas    Allergies  Allergen Reactions  . Latex Other (See Comments)    Latex condoms cause bacterial infections and irritation.    Current Outpatient Medications on File Prior to Visit  Medication Sig Dispense Refill  . megestrol (MEGACE) 40 MG tablet Take 3 tablets (120 mg total) by mouth daily. 90 tablet 0   No current facility-administered medications on file prior to visit.    Observations/Objective: NAD. Speaking clearly.  Work of breathing normal.  Alert and oriented. Mood appropriate.   Assessment and Plan: 1. Prediabetes Last A1c 6.0 in May 2021. Will  repeat. Discussed carb modified diet and exercise to prevent risk of progression to DM.  - Hemoglobin A1c; Future - Comprehensive metabolic panel; Future  2. Class 3 severe obesity with serious comorbidity in adult, unspecified BMI, unspecified obesity type [facility]) Patient is interested in more comprehensive weight loss plan and would like to learn more about bariatric surgery. Will refer to medical weight management. Discussed that weight loss achieved by whichever measure has been associated with reversal/improvement of co-morbidities such as prediabetes and OSA.  - Amb Ref to Medical Weight Management  3. OSA (obstructive sleep apnea) Sleep study showed moderate OSA. Will order CPAP titration study as recommended.  - Cpap titration; Future   Follow Up Instructions: Lab 8/27    I discussed the assessment and treatment plan with the patient. The patient was provided an opportunity to ask questions and all were answered. The patient agreed with the plan and demonstrated an understanding of the instructions.   The patient was advised to call back or seek an in-person evaluation if the symptoms worsen or if the condition fails to improve as anticipated.     I provided 20 minutes total of non-face-to-face time during this encounter including median intraservice time, reviewing previous notes, investigations, ordering medications, medical decision making, coordinating care and patient verbalized understanding at the end of the visit.    Marcy Siren, D.O. Primary Care at [facility]  10/25/2019, 1:44 PM

## 2019-10-26 ENCOUNTER — Other Ambulatory Visit: Payer: Self-pay

## 2019-10-26 NOTE — Telephone Encounter (Signed)
Prescription refill fax received from [facility] for megestrol. Request denied due to patient stating trying to conceive.   Bryson Dames., RN

## 2019-10-29 ENCOUNTER — Other Ambulatory Visit: Payer: Self-pay

## 2019-10-29 ENCOUNTER — Other Ambulatory Visit (INDEPENDENT_AMBULATORY_CARE_PROVIDER_SITE_OTHER): Payer: 59

## 2019-10-29 ENCOUNTER — Other Ambulatory Visit: Payer: 59

## 2019-10-29 DIAGNOSIS — R7303 Prediabetes: Secondary | ICD-10-CM | POA: Diagnosis not present

## 2019-10-30 LAB — COMPREHENSIVE METABOLIC PANEL
ALT: 13 IU/L (ref 0–32)
AST: 10 IU/L (ref 0–40)
Albumin/Globulin Ratio: 1.4 (ref 1.2–2.2)
Albumin: 4.2 g/dL (ref 3.8–4.8)
Alkaline Phosphatase: 89 IU/L (ref 48–121)
BUN/Creatinine Ratio: 15 (ref 9–23)
BUN: 12 mg/dL (ref 6–20)
Bilirubin Total: 0.6 mg/dL (ref 0.0–1.2)
CO2: 25 mmol/L (ref 20–29)
Calcium: 9.3 mg/dL (ref 8.7–10.2)
Chloride: 105 mmol/L (ref 96–106)
Creatinine, Ser: 0.81 mg/dL (ref 0.57–1.00)
GFR calc Af Amer: 110 mL/min/{1.73_m2} (ref >59)
GFR calc non Af Amer: 95 mL/min/{1.73_m2} (ref >59)
Globulin, Total: 3 g/dL (ref 1.5–4.5)
Glucose: 126 mg/dL — ABNORMAL HIGH (ref 65–99)
Potassium: 4.1 mmol/L (ref 3.5–5.2)
Sodium: 140 mmol/L (ref 134–144)
Total Protein: 7.2 g/dL (ref 6.0–8.5)

## 2019-10-30 LAB — HEMOGLOBIN A1C
Est. average glucose Bld gHb Est-mCnc: 131 mg/dL
Hgb A1c MFr Bld: 6.2 % — ABNORMAL HIGH (ref 4.8–5.6)

## 2019-11-03 ENCOUNTER — Encounter (INDEPENDENT_AMBULATORY_CARE_PROVIDER_SITE_OTHER): Payer: Self-pay

## 2019-11-16 ENCOUNTER — Encounter (INDEPENDENT_AMBULATORY_CARE_PROVIDER_SITE_OTHER): Payer: Self-pay | Admitting: Family Medicine

## 2019-11-16 ENCOUNTER — Ambulatory Visit (INDEPENDENT_AMBULATORY_CARE_PROVIDER_SITE_OTHER): Payer: 59 | Admitting: Family Medicine

## 2019-11-16 ENCOUNTER — Other Ambulatory Visit: Payer: Self-pay

## 2019-11-16 VITALS — BP 120/75 | HR 77 | Temp 98.7°F | Ht 66.0 in | Wt 278.0 lb

## 2019-11-16 DIAGNOSIS — R5383 Other fatigue: Secondary | ICD-10-CM | POA: Diagnosis not present

## 2019-11-16 DIAGNOSIS — G4733 Obstructive sleep apnea (adult) (pediatric): Secondary | ICD-10-CM | POA: Diagnosis not present

## 2019-11-16 DIAGNOSIS — Z0289 Encounter for other administrative examinations: Secondary | ICD-10-CM

## 2019-11-16 DIAGNOSIS — E1169 Type 2 diabetes mellitus with other specified complication: Secondary | ICD-10-CM

## 2019-11-16 DIAGNOSIS — Z1331 Encounter for screening for depression: Secondary | ICD-10-CM

## 2019-11-16 DIAGNOSIS — Z8249 Family history of ischemic heart disease and other diseases of the circulatory system: Secondary | ICD-10-CM | POA: Insufficient documentation

## 2019-11-16 DIAGNOSIS — R0602 Shortness of breath: Secondary | ICD-10-CM | POA: Diagnosis not present

## 2019-11-16 DIAGNOSIS — Z9189 Other specified personal risk factors, not elsewhere classified: Secondary | ICD-10-CM | POA: Diagnosis not present

## 2019-11-16 DIAGNOSIS — Z6841 Body Mass Index (BMI) 40.0 and over, adult: Secondary | ICD-10-CM

## 2019-11-17 LAB — CBC WITH DIFFERENTIAL/PLATELET
Basophils Absolute: 0 10*3/uL (ref 0.0–0.2)
Basos: 0 %
EOS (ABSOLUTE): 0.2 10*3/uL (ref 0.0–0.4)
Eos: 2 %
Hematocrit: 36.1 % (ref 34.0–46.6)
Hemoglobin: 11.2 g/dL (ref 11.1–15.9)
Immature Grans (Abs): 0 10*3/uL (ref 0.0–0.1)
Immature Granulocytes: 0 %
Lymphocytes Absolute: 2.3 10*3/uL (ref 0.7–3.1)
Lymphs: 25 %
MCH: 22.2 pg — ABNORMAL LOW (ref 26.6–33.0)
MCHC: 31 g/dL — ABNORMAL LOW (ref 31.5–35.7)
MCV: 72 fL — ABNORMAL LOW (ref 79–97)
Monocytes Absolute: 0.5 10*3/uL (ref 0.1–0.9)
Monocytes: 6 %
Neutrophils Absolute: 6.2 10*3/uL (ref 1.4–7.0)
Neutrophils: 67 %
Platelets: 391 10*3/uL (ref 150–450)
RBC: 5.04 x10E6/uL (ref 3.77–5.28)
RDW: 18.8 % — ABNORMAL HIGH (ref 11.7–15.4)
WBC: 9.3 10*3/uL (ref 3.4–10.8)

## 2019-11-17 LAB — LIPID PANEL WITH LDL/HDL RATIO
Cholesterol, Total: 122 mg/dL (ref 100–199)
HDL: 40 mg/dL (ref >39)
LDL Chol Calc (NIH): 69 mg/dL (ref 0–99)
LDL/HDL Ratio: 1.7 ratio (ref 0.0–3.2)
Triglycerides: 62 mg/dL (ref 0–149)
VLDL Cholesterol Cal: 13 mg/dL (ref 5–40)

## 2019-11-17 LAB — T4, FREE: Free T4: 1.11 ng/dL (ref 0.82–1.77)

## 2019-11-17 LAB — FOLATE: Folate: 5.3 ng/mL (ref >3.0)

## 2019-11-17 LAB — TSH: TSH: 0.953 u[IU]/mL (ref 0.450–4.500)

## 2019-11-17 LAB — T3: T3, Total: 124 ng/dL (ref 71–180)

## 2019-11-17 LAB — VITAMIN D 25 HYDROXY (VIT D DEFICIENCY, FRACTURES): Vit D, 25-Hydroxy: 18.3 ng/mL — ABNORMAL LOW (ref 30.0–100.0)

## 2019-11-17 LAB — VITAMIN B12: Vitamin B-12: 360 pg/mL (ref 232–1245)

## 2019-11-17 LAB — INSULIN, RANDOM: INSULIN: 31.5 u[IU]/mL — ABNORMAL HIGH (ref 2.6–24.9)

## 2019-11-18 NOTE — Progress Notes (Signed)
Dear Dr. Earlene Plater,   Thank you for referring Regina Watkins to our clinic. The following note includes my evaluation and treatment recommendations.  Chief Complaint:   OBESITY Regina Watkins (MR# [id number]) is a 34 y.o. female who presents for evaluation and treatment of obesity and related comorbidities. Current BMI is Body mass index is 44.87 kg/m. Regina Watkins has been struggling with her weight for many years and has been unsuccessful in either losing weight, maintaining weight loss, or reaching her healthy weight goal.  Regina Watkins is currently in the action stage of change and ready to dedicate time achieving and maintaining a healthier weight. Regina Watkins is interested in becoming our patient and working on intensive lifestyle modifications including (but not limited to) diet and exercise for weight loss.  Regina Watkins usually skips breakfast and lunch.  She works 2 jobs.  She has 2 children at home.  She craves fried foods and eats out 3-4 times per week.  Regina Watkins's habits were reviewed today and are as follows: Her family eats meals together, she thinks her family will eat healthier with her, her desired weight loss is 80-90 pounds, she has been heavy most of her life, she started gaining weight when she was younger, her heaviest weight ever was 280 pounds, she craves fried foods, she snacks frequently in the evenings, she skips breakfast and lunch frequently, she is frequently drinking liquids with calories, she frequently makes poor food choices and she struggles with emotional eating.  Depression Screen Regina Watkins's Food and Mood (modified PHQ-9) score was 6.  Depression screen PHQ 2/9 11/16/2019  Decreased Interest 1  Down, Depressed, Hopeless 0  PHQ - 2 Score 1  Altered sleeping 1  Tired, decreased energy 2  Change in appetite 0  Feeling bad or failure about yourself  1  Trouble concentrating 1  Moving slowly or fidgety/restless 0  Suicidal thoughts 0  PHQ-9 Score 6    Subjective:   1. Other fatigue Regina Watkins admits to daytime somnolence and reports waking up still tired. Patent has a history of symptoms of daytime fatigue, morning fatigue, morning headache and snoring. Regina Watkins generally gets 5 hours of sleep per night, and states that she has poor quality sleep. Snoring is present. Apneic episodes are not present. Epworth Sleepiness Score is 6.  2. Shortness of breath on exertion Regina Watkins does feel that her weight is causing her energy to be lower than it should be. Fatigue may be related to obesity, depression or many other causes. Labs will be ordered, and in the meanwhile, Regina Watkins will focus on self care including making healthy food choices, increasing physical activity and focusing on stress reduction.  3. Type 2 diabetes mellitus with other specified complication, without long-term current use of insulin (HCC) Medications reviewed. Diabetic ROS: no polyuria or polydipsia, no chest pain, dyspnea or TIA's, no numbness, tingling or pain in extremities.    Lab Results  Component Value Date   HGBA1C 6.2 (H) 10/29/2019   HGBA1C 6.0 (A) 07/22/2019   HGBA1C 7.1 (H) 04/13/2019   Lab Results  Component Value Date   LDLCALC 69 11/16/2019   CREATININE 0.81 10/29/2019   Lab Results  Component Value Date   INSULIN 31.5 (H) 11/16/2019   4. OSA (obstructive sleep apnea) Regina Watkins has a diagnosis of sleep apnea.  She had a sleep study on 10/12/2019.  Chart was reviewed.  5. Depression screening Regina Watkins was screened for depression as part of her new patient workup today.  6. At risk  for impaired metabolic function Due to Regina Watkins's current state of health and medical condition(s), they are at a significantly higher risk for impaired metabolic function.   This further also puts the patient at much greater risk to also subsequently develop cardiopulmonary conditions that can negatively affect patient's quality of life as well.  At least 8 minutes was spent on  counseling Regina Watkins about these concerns today and I stressed the importance of reversing these risks factors.   Initial goal is to lose at least 5-10% of starting weight to help reduce risk factors.   Counseling: Intensive lifestyle modifications discussed with Regina Watkins as most appropriate first line treatment.  she will continue to work on diet, exercise and weight loss efforts.  We will continue to reassess these conditions on a fairly regular basis in an attempt to decrease patient's overall morbidity and mortality  Assessment/Plan:   1. Other fatigue Regina Watkins does feel that her weight is causing her energy to be lower than it should be. Fatigue may be related to obesity, depression or many other causes. Labs will be ordered, and in the meanwhile, Regina Watkins will focus on self care including making healthy food choices, increasing physical activity and focusing on stress reduction.  Check labs, IC, ECG.  Needs to get treatment for her OSA.  - EKG 12-Lead - Vitamin B12 - CBC with Differential/Platelet - Lipid Panel With LDL/HDL Ratio - T3 - T4, free - TSH - VITAMIN D 25 Hydroxy (Vit-D Deficiency, Fractures) - Insulin, random - Folate  2. Shortness of breath on exertion Regina Watkins does feel that her weight is causing her energy to be lower than it should be. Fatigue may be related to obesity, depression or many other causes. Labs will be ordered, and in the meanwhile, Regina Watkins will focus on self care including making healthy food choices, increasing physical activity and focusing on stress reduction.  Check labs, IC, ECG.  - Lipid Panel With LDL/HDL Ratio  3. Type 2 diabetes mellitus with other specified complication, without long-term current use of insulin (HCC) Good blood sugar control is important to decrease the likelihood of diabetic complications such as nephropathy, neuropathy, limb loss, blindness, coronary artery disease, and death. Intensive lifestyle modification including diet, exercise  and weight loss are the first line of treatment for diabetes.  Long discussion with her regarding that once she is a diabetic, she will always be a diabetic and it is just a matter of being diet-controlled/lifestyle controlled or not.  Explained she will always have to watch her simple carb intake, control weight, etc.  Recommend prudent nutritional plan, weight loss.  - Vitamin B12 - CBC with Differential/Platelet - Lipid Panel With LDL/HDL Ratio - T3 - T4, free - TSH - VITAMIN D 25 Hydroxy (Vit-D Deficiency, Fractures) - Insulin, random - Folate  4. OSA (obstructive sleep apnea) PCP ordered CPAP titration.  Recommend she call their office to coordinate.  Importance of use discussed with her.  Intensive lifestyle modifications are the first line treatment for this issue. We discussed several lifestyle modifications today and she will continue to work on diet, exercise and weight loss efforts. We will continue to monitor. Orders and follow up as documented in patient record.   - Vitamin B12 - CBC with Differential/Platelet - Lipid Panel With LDL/HDL Ratio - T3 - T4, free - TSH - VITAMIN D 25 Hydroxy (Vit-D Deficiency, Fractures) - Insulin, random - Folate  5. Depression screening Madalynne had a positive depression screening. Depression is commonly associated with obesity  and often results in emotional eating behaviors. We will monitor this closely and work on CBT to help improve the non-hunger eating patterns. Referral to Psychology may be required if no improvement is seen as she continues in our clinic.  This is the patient's first visit at [facility].  The patient's NEW PATIENT PACKET that they filled out prior to today's office visit was reviewed at length and some information from that paperwork was also included within the following office visit note.    Included in the packet: current and past health history, medications, allergies, ROS, gynecologic history (women  only), surgical history, family history, social history, weight history, weight loss surgery history (for those that have had weight loss surgery), nutritional evaluation, mood and food questionnaire along with a depression screening (PHQ9) on all patients, an Epworth questionnaire, sleep habits questionnaire, patient life and health improvement goals questionnaire. These will all be scanned into the patient's chart under media.   During the visit, I independently reviewed the patient's EKG, bioimpedance scale results, and indirect calorimeter results. I used this information to tailor a meal plan for the patient that will help Nyree to lose weight and will improve her obesity-related conditions going forward. I performed a medically necessary appropriate examination and/or evaluation. I discussed the assessment and treatment plan with the patient. The patient was provided an opportunity to ask questions and all were answered. The patient agreed with the plan and demonstrated an understanding of the instructions. Labs were ordered at this visit and will be reviewed at the next visit unless more critical results need to be addressed immediately. Clinical information was updated and documented in the EMR.   Time spent on visit including pre-visit chart review and post-visit care was estimated to be 60-74 minutes.  A separate 15 minutes was spent on risk counseling (see above/below).   6. At risk for impaired metabolic function Due to Mariska's current state of health and medical condition(s), they are at a significantly higher risk for impaired metabolic function.   This further also puts the patient at much greater risk to also subsequently develop cardiopulmonary conditions that can negatively affect patient's quality of life as well.  At least 8 minutes was spent on counseling Regina Watkins about these concerns today and I stressed the importance of reversing these risks factors.   Initial goal is to lose at  least 5-10% of starting weight to help reduce risk factors.   Counseling: Intensive lifestyle modifications discussed with Regina Watkins as most appropriate first line treatment.  she will continue to work on diet, exercise and weight loss efforts.  We will continue to reassess these conditions on a fairly regular basis in an attempt to decrease patient's overall morbidity and mortality  7. Class 3 severe obesity with serious comorbidity and body mass index (BMI) of 40.0 to 44.9 in adult, unspecified obesity type (HCC) Regina Watkins is currently in the action stage of change and her goal is to continue with weight loss efforts. I recommend Regina Watkins begin the structured treatment plan as follows:  She has agreed to the Category 1 Plan.  Exercise goals: As is.   Behavioral modification strategies: decreasing simple carbohydrates, decreasing liquid calories, meal planning and cooking strategies, keeping healthy foods in the home and planning for success.  She was informed of the importance of frequent follow-up visits to maximize her success with intensive lifestyle modifications for her multiple health conditions. She was informed we would discuss her lab results at her next visit  unless there is a critical issue that needs to be addressed sooner. Azariyah agreed to keep her next visit at the agreed upon time to discuss these results.  Objective:   Blood pressure 120/75, pulse 77, temperature 98.7 F (37.1 C), height 5\' 6"  (1.676 m), weight 278 lb (126.1 kg), SpO2 97 %. Body mass index is 44.87 kg/m.  EKG: Normal sinus rhythm, rate 86 bpm.  Indirect Calorimeter completed today shows a VO2 of 204 and a REE of 1418.  Her calculated basal metabolic rate is thus her basal metabolic rate is worse than expected.  General: Cooperative, alert, well developed, in no acute distress. HEENT: Conjunctivae and lids unremarkable. Cardiovascular: Regular rhythm.  Lungs: Normal work of breathing. Neurologic: No focal  deficits.   Lab Results  Component Value Date   CREATININE 0.81 10/29/2019   BUN 12 10/29/2019   NA 140 10/29/2019   K 4.1 10/29/2019   CL 105 10/29/2019   CO2 25 10/29/2019   Lab Results  Component Value Date   ALT 13 10/29/2019   AST 10 10/29/2019   ALKPHOS 89 10/29/2019   BILITOT 0.6 10/29/2019   Lab Results  Component Value Date   HGBA1C 6.2 (H) 10/29/2019   HGBA1C 6.0 (A) 07/22/2019   HGBA1C 7.1 (H) 04/13/2019   Lab Results  Component Value Date   INSULIN 31.5 (H) 11/16/2019   Lab Results  Component Value Date   TSH 0.953 11/16/2019   Lab Results  Component Value Date   CHOL 122 11/16/2019   HDL 40 11/16/2019   LDLCALC 69 11/16/2019   TRIG 62 11/16/2019   CHOLHDL 3.2 04/13/2019   Lab Results  Component Value Date   WBC 9.3 11/16/2019   HGB 11.2 11/16/2019   HCT 36.1 11/16/2019   MCV 72 (L) 11/16/2019   PLT 391 11/16/2019   Attestation Statements:   Reviewed by clinician on day of visit: allergies, medications, problem list, medical history, surgical history, family history, social history, and previous encounter notes.  I, 11/18/2019, CMA, am acting as Insurance claims handler for Energy manager, DO.  I have reviewed the above documentation for accuracy and completeness, and I agree with the above. -  [employer], DO

## 2019-11-30 ENCOUNTER — Other Ambulatory Visit: Payer: Self-pay

## 2019-11-30 ENCOUNTER — Ambulatory Visit (INDEPENDENT_AMBULATORY_CARE_PROVIDER_SITE_OTHER): Payer: 59 | Admitting: Family Medicine

## 2019-11-30 ENCOUNTER — Encounter (INDEPENDENT_AMBULATORY_CARE_PROVIDER_SITE_OTHER): Payer: Self-pay | Admitting: Family Medicine

## 2019-11-30 VITALS — BP 108/70 | HR 80 | Temp 98.9°F | Ht 66.0 in | Wt 277.8 lb

## 2019-11-30 DIAGNOSIS — Z6841 Body Mass Index (BMI) 40.0 and over, adult: Secondary | ICD-10-CM

## 2019-11-30 DIAGNOSIS — Z9189 Other specified personal risk factors, not elsewhere classified: Secondary | ICD-10-CM

## 2019-11-30 DIAGNOSIS — E559 Vitamin D deficiency, unspecified: Secondary | ICD-10-CM | POA: Diagnosis not present

## 2019-11-30 DIAGNOSIS — E1169 Type 2 diabetes mellitus with other specified complication: Secondary | ICD-10-CM | POA: Diagnosis not present

## 2019-11-30 DIAGNOSIS — D508 Other iron deficiency anemias: Secondary | ICD-10-CM | POA: Diagnosis not present

## 2019-11-30 MED ORDER — VITAMIN D (ERGOCALCIFEROL) 1.25 MG (50000 UNIT) PO CAPS: 50000.0000 [IU] | ORAL_CAPSULE | ORAL | 0 refills | Status: DC

## 2019-11-30 MED ORDER — FERROUS SULFATE 325 (65 FE) MG PO TBEC: DELAYED_RELEASE_TABLET | ORAL | 0 refills | Status: DC

## 2019-12-02 NOTE — Progress Notes (Signed)
Chief Complaint:   OBESITY Regina Watkins is here to discuss her progress with her obesity treatment plan along with follow-up of her obesity related diagnoses. Regina Watkins is on the Category 1 Plan and states she is following her eating plan approximately 60% of the time. Regina Watkins states she is walking and doing cardio for 30 minutes 6 times per week.  Today's visit was #: 2 Starting weight: 278 lbs Starting date: 11/16/2019 Today's weight: 277 lbs Today's date: 11/30/2019 Total lbs lost to date: 1 lb Total lbs lost since last in-office visit: 1 lb  Interim History:  This is her first follow-up office visit.  Will discuss labs from PCP on 10/29/2019 and ours done on 11/16/2019.  Regina Watkins is not eating 3 meals per day.  She is not meal prepping.  She eats lunch only all days and mostly breakfast.  For dinner, she eats peanuts, fruit, cheese and crackers.  She says she has no appetite when she eats her breakfast.  Soda cravings, but no other cravings.  She drinks Diet Coke and Diet Dr. Reino Kent to help it go away.    Subjective:   1. Type 2 diabetes mellitus with other specified complication, without long-term current use of insulin (HCC) Medications reviewed. Diabetic ROS: no polyuria or polydipsia, no chest pain, dyspnea or TIA's, no numbness, tingling or pain in extremities.  I explained to her that with several FBS over 126 and A1c of 7.1, diabetes is her diagnosis, but she is now a diet-controlled diabetic.    Lab Results  Component Value Date   HGBA1C 6.2 (H) 10/29/2019   HGBA1C 6.0 (A) 07/22/2019   HGBA1C 7.1 (H) 04/13/2019   Lab Results  Component Value Date   LDLCALC 69 11/16/2019   CREATININE 0.81 10/29/2019   Lab Results  Component Value Date   INSULIN 31.5 (H) 11/16/2019   2. Iron deficiency anemia secondary to inadequate dietary iron intake Regina Watkins is not a vegetarian.  She does not have a history of weight loss surgery.  She was on medication 2 years ago, but once she took the  medication and the bottle was done, she stopped.  No side effects from the iron supplement.  She endorses fatigue and tiredness.   CBC Latest Ref Rng & Units 11/16/2019 04/13/2019 04/27/2018  WBC 3.4 - 10.8 x10E3/uL 9.3 9.6 10.9(H)  Hemoglobin 11.1 - 15.9 g/dL 01.0 27.2 53.6  Hematocrit 34.0 - 46.6 % 36.1 35.8 37.5  Platelets 150 - 450 x10E3/uL 391 393 336   Lab Results  Component Value Date   VITAMINB12 360 11/16/2019   3. Vitamin D deficiency Regina Watkins's Vitamin D level was 18.3 on 11/16/2019. She is currently taking no vitamin D supplement. She denies nausea, vomiting or muscle weakness.  She endorses fatigue.  4. At risk for deficient knowledge of diabetes mellitus Regina Watkins is at risk for deficient knowledge of diabetes mellitus.    Assessment/Plan:   1. Type 2 diabetes mellitus with other specified complication, without long-term current use of insulin (HCC) Discussed labs with patient today.  Good blood sugar control is important to decrease the likelihood of diabetic complications such as nephropathy, neuropathy, limb loss, blindness, coronary artery disease, and death. Intensive lifestyle modification including diet, exercise and weight loss are the first line of treatment for diabetes.  She declines medication .  Low simple carb meal plan - as is.  Increase proteins - follow plan and weight loss is needed.  2. Iron deficiency anemia secondary to inadequate dietary  iron intake Worsening.  Discussed labs with patient today.  She will start iron supplement twice daily.  Recheck CBC in 2-3 months.  Orders and follow up as documented in patient record.  Counseling . Iron is essential for our bodies to make red blood cells.  Reasons that someone may be deficient include: an iron-deficient diet (more likely in those following vegan or vegetarian diets), women with heavy menses, patients with GI disorders or poor absorption, patients that have had bariatric surgery, frequent blood donors,  patients with cancer, and patients with heart disease.   Regina Watkins foods include dark leafy greens, red and white meats, eggs, seafood, and beans.   . Certain foods and drinks prevent your body from absorbing iron properly. Avoid eating these foods in the same meal as iron-rich foods or with iron supplements. These foods include: coffee, black tea, and red wine; milk, dairy products, and foods that are high in calcium; beans and soybeans; whole grains.  . Constipation can be a side effect of iron supplementation. Increased water and fiber intake are helpful. Water goal: > 2 liters/day. Fiber goal: > 25 grams/day.  3. Vitamin D deficiency New.  Discussed labs with patient today.  Low Vitamin D level contributes to fatigue and are associated with obesity, breast, and colon cancer. She agrees to start to take prescription Vitamin D @50 ,000 IU every week and will follow-up for routine testing of Vitamin D, at least 2-3 times per year to avoid over-replacement.  Recheck level in around 3 months or so.  -Start Vitamin D, Ergocalciferol, (DRISDOL) 1.25 MG (50000 UNIT) CAPS capsule; Take 1 capsule (50,000 Units total) by mouth every 7 (seven) days.  Dispense: 4 capsule; Refill: 0  4. At risk for deficient knowledge of diabetes mellitus was given approximately 30 minutes of diabetes education and counseling today. We discussed intensive lifestyle modifications today with an emphasis on weight loss as well as increasing exercise and decreasing simple carbohydrates in her diet. We also reviewed medication options with an emphasis on risk versus benefit of those discussed.   5. Class 3 severe obesity with serious comorbidity and body mass index (BMI) of 40.0 to 44.9 in adult, unspecified obesity type (HCC)  Regina Watkins is currently in the action stage of change. As such, her goal is to continue with weight loss efforts. She has agreed to the Category 1 Plan.   Exercise goals: As is.  Behavioral  modification strategies: increasing lean protein intake, decreasing simple carbohydrates, no skipping meals, meal planning and cooking strategies, keeping healthy foods in the home, better snacking choices and planning for success.  Regina Watkins has agreed to follow-up with our clinic in 2 weeks. She was informed of the importance of frequent follow-up visits to maximize her success with intensive lifestyle modifications for her multiple health conditions.     Objective:   Blood pressure 108/70, pulse 80, temperature 98.9 F (37.2 C), height 5\' 6"  (1.676 m), weight 277 lb 12.8 oz (126 kg), SpO2 99 %. Body mass index is 44.84 kg/m.  General: Cooperative, alert, well developed, in no acute distress. HEENT: Conjunctivae and lids unremarkable. Cardiovascular: Regular rhythm.  Lungs: Normal work of breathing. Neurologic: No focal deficits.   Lab Results  Component Value Date   CREATININE 0.81 10/29/2019   BUN 12 10/29/2019   NA 140 10/29/2019   K 4.1 10/29/2019   CL 105 10/29/2019   CO2 25 10/29/2019   Lab Results  Component Value Date   ALT 13 10/29/2019  AST 10 10/29/2019   ALKPHOS 89 10/29/2019   BILITOT 0.6 10/29/2019   Lab Results  Component Value Date   HGBA1C 6.2 (H) 10/29/2019   HGBA1C 6.0 (A) 07/22/2019   HGBA1C 7.1 (H) 04/13/2019   Lab Results  Component Value Date   INSULIN 31.5 (H) 11/16/2019   Lab Results  Component Value Date   TSH 0.953 11/16/2019   Lab Results  Component Value Date   CHOL 122 11/16/2019   HDL 40 11/16/2019   LDLCALC 69 11/16/2019   TRIG 62 11/16/2019   CHOLHDL 3.2 04/13/2019   Lab Results  Component Value Date   WBC 9.3 11/16/2019   HGB 11.2 11/16/2019   HCT 36.1 11/16/2019   MCV 72 (L) 11/16/2019   PLT 391 11/16/2019   Attestation Statements:   Reviewed by clinician on day of visit: allergies, medications, problem list, medical history, surgical history, family history, social history, and previous encounter notes.  I,  Insurance claims handler, CMA, am acting as Energy manager for [employer], DO.  I have reviewed the above documentation for accuracy and completeness, and I agree with the above. Thomasene Lot, DO

## 2019-12-14 ENCOUNTER — Ambulatory Visit (INDEPENDENT_AMBULATORY_CARE_PROVIDER_SITE_OTHER): Payer: 59 | Admitting: Family Medicine

## 2019-12-22 ENCOUNTER — Ambulatory Visit (INDEPENDENT_AMBULATORY_CARE_PROVIDER_SITE_OTHER): Payer: 59 | Admitting: Family Medicine

## 2019-12-22 ENCOUNTER — Encounter (INDEPENDENT_AMBULATORY_CARE_PROVIDER_SITE_OTHER): Payer: Self-pay

## 2019-12-22 ENCOUNTER — Other Ambulatory Visit: Payer: Self-pay

## 2019-12-23 ENCOUNTER — Encounter (HOSPITAL_BASED_OUTPATIENT_CLINIC_OR_DEPARTMENT_OTHER): Payer: Self-pay | Admitting: Internal Medicine

## 2019-12-28 ENCOUNTER — Ambulatory Visit (HOSPITAL_BASED_OUTPATIENT_CLINIC_OR_DEPARTMENT_OTHER): Payer: 59 | Attending: Internal Medicine | Admitting: Internal Medicine

## 2019-12-28 ENCOUNTER — Other Ambulatory Visit: Payer: Self-pay

## 2019-12-28 VITALS — Ht 66.0 in | Wt 278.0 lb

## 2019-12-28 DIAGNOSIS — G4733 Obstructive sleep apnea (adult) (pediatric): Secondary | ICD-10-CM | POA: Diagnosis present

## 2020-01-09 DIAGNOSIS — G4733 Obstructive sleep apnea (adult) (pediatric): Secondary | ICD-10-CM

## 2020-01-09 NOTE — Procedures (Signed)
Patient Name: Regina Watkins, Regina Watkins Date: 12/28/2019 Gender: Female D.O.B: 05/23/1985 Age (years): 34 Referring Provider: De Hollingshead DO Height (inches): 66 Interpreting Physician: Jetty Duhamel MD, ABSM Weight (lbs): 278 RPSGT: Armen Pickup BMI: 45 MRN: [id number] Neck Size: 14.50  CLINICAL INFORMATION The patient is referred for a CPAP titration to treat sleep apnea.  Date of NPSG, Split Night or HST:  NPSG 8/111/21  AHI 17/ hr, desaturation to 82%, body we8ight 285 lbs  SLEEP STUDY TECHNIQUE As per the AASM Manual for the Scoring of Sleep and Associated Events v2.3 (April 2016) with a hypopnea requiring 4% desaturations.  The channels recorded and monitored were frontal, central and occipital EEG, electrooculogram (EOG), submentalis EMG (chin), nasal and oral airflow, thoracic and abdominal wall motion, anterior tibialis EMG, snore microphone, electrocardiogram, and pulse oximetry. Continuous positive airway pressure (CPAP) was initiated at the beginning of the study and titrated to treat sleep-disordered breathing.  MEDICATIONS Medications self-administered by patient taken the night of the study : None reported  TECHNICIAN COMMENTS Comments added by technician: PT HAD ONE REST ROOM VISTED. CPAP MACHINE STOP WORKING ON EPOCH 293, 308 AND 344. TECH HAD TO RESTART THREE TIME. Comments added by scorer: N/A RESPIRATORY PARAMETERS Optimal PAP Pressure (cm): 14 AHI at Optimal Pressure (/hr): 0.0 Overall Minimal O2 (%): 95.0 Supine % at Optimal Pressure (%): 16 Minimal O2 at Optimal Pressure (%): 95.0   SLEEP ARCHITECTURE The study was initiated at 10:23:14 PM and ended at 4:18:04 AM.  Sleep onset time was 37.0 minutes and the sleep efficiency was 69.8%%. The total sleep time was 247.5 minutes.  The patient spent 7.3%% of the night in stage N1 sleep, 51.7%% in stage N2 sleep, 26.7%% in stage N3 and 14.3% in REM.Stage REM latency was 106.5 minutes  Wake after  sleep onset was 70.3. Alpha intrusion was absent. Supine sleep was 18.79%.  CARDIAC DATA The 2 lead EKG demonstrated sinus rhythm. The mean heart rate was 77.4 beats per minute. Other EKG findings include: None.  LEG MOVEMENT DATA The total Periodic Limb Movements of Sleep (PLMS) were 0. The PLMS index was 0.0. A PLMS index of <15 is considered normal in adults.  IMPRESSIONS - The optimal PAP pressure was 14 cm of water. - Central sleep apnea was not noted during this titration (CAI = 0.0/h). - Significant oxygen desaturations were not observed during this titration (min O2 = 95.0%). - No snoring was audible during this study. - No cardiac abnormalities were observed during this study. - Clinically significant periodic limb movements were not noted during this study.   DIAGNOSIS - Obstructive Sleep Apnea (G47.33)  RECOMMENDATIONS - Trial of CPAP therapy on 14 cm H2O or autopap 10-20, - Patient used a Small size Resmed Full Face Mask AirFit F20 mask and heated humidification. - Be careful with alcohol, sedatives and other CNS depressants that may worsen sleep apnea and disrupt normal sleep architecture. - Sleep hygiene should be reviewed to assess factors that may improve sleep quality. - Weight management and regular exercise should be initiated or continued.  [Electronically signed] 01/09/2020 01:52 PM  Jetty Duhamel MD, ABSM Diplomate, American Board of Sleep Medicine   NPI: [id number]                         Jetty Duhamel Diplomate, American Board of Sleep Medicine  ELECTRONICALLY SIGNED ON:  01/09/2020, 1:49 PM [facility] PH: [phone]   FX: [phone] [id number] ACCREDITED BY THE AMERICAN ACADEMY OF SLEEP MEDICINE

## 2020-01-11 ENCOUNTER — Encounter: Payer: Self-pay | Admitting: *Deleted

## 2020-01-19 ENCOUNTER — Other Ambulatory Visit: Payer: Self-pay | Admitting: Obstetrics & Gynecology

## 2020-02-03 ENCOUNTER — Telehealth: Payer: Self-pay | Admitting: Family Medicine

## 2020-02-03 NOTE — Telephone Encounter (Signed)
I called [name] and left a message I am returning her call and was calling to discuss her request- please call office back and leave a detailed message about what RX you are talking about. Louna Rothgeb,RN

## 2020-02-03 NOTE — Telephone Encounter (Signed)
Patient is calling wanting to know why her RX has not been approved  [place] Spaniel its been a few now

## 2020-02-04 ENCOUNTER — Telehealth: Payer: Self-pay | Admitting: *Deleted

## 2020-02-04 NOTE — Telephone Encounter (Signed)
Nakenya called back to front desk and call transferred to nurse. She states she missed our call back yesterday. She reports she is wanting to see why she doesn't have any refills of her megace- states the doctor approved it when she saw him last ( Dr. Mayford Knife).States she takes megace as needed but is out. States has been bleeding 2 weeks and was light first 3 days, then heavy and very heavy. I advised may need to go to hospital but she states she has been dealing with this for years and has anemia at times and takes iron. States she is not dizzy, lightheaded- just needs refill.  Per chart saw Dr. Mayford Knife 08/2019 and had refill of megace sent in 10/2019 for 1 month.  Informed her will need to discuss with Dr. Mayford Knife and he is not here today but we will get back to her as soon as we know if he approved a refill and specifically if he wants her to take as needed or daily.she does report she is still hoping to get pregnant.  Jullisa Grigoryan,RN

## 2020-02-07 ENCOUNTER — Other Ambulatory Visit (HOSPITAL_COMMUNITY): Payer: Self-pay | Admitting: Obstetrics & Gynecology

## 2020-02-07 MED ORDER — MEGESTROL ACETATE 40 MG PO TABS
120.0000 mg | ORAL_TABLET | Freq: Every day | ORAL | 1 refills | Status: DC
Start: 2020-02-07 — End: 2020-06-06

## 2020-02-09 NOTE — Telephone Encounter (Signed)
Per chart review see that RX was sent in by Dr. Mayford Knife for Megace on 02/07/20. I called [name] and left a message I am calling to make sure she is aware the RX she requested has been sent to her pharmacy; call us if questions . Stanlee Roehrig,RN

## 2020-06-06 ENCOUNTER — Other Ambulatory Visit: Payer: Self-pay

## 2020-06-06 ENCOUNTER — Encounter: Payer: Self-pay | Admitting: Advanced Practice Midwife

## 2020-06-06 ENCOUNTER — Other Ambulatory Visit (HOSPITAL_COMMUNITY)
Admission: RE | Admit: 2020-06-06 | Discharge: 2020-06-06 | Disposition: A | Discharge status: Home / Self Care | Payer: 59 | Source: Ambulatory Visit | Attending: Advanced Practice Midwife | Admitting: Advanced Practice Midwife

## 2020-06-06 ENCOUNTER — Ambulatory Visit (INDEPENDENT_AMBULATORY_CARE_PROVIDER_SITE_OTHER): Payer: 59 | Admitting: Advanced Practice Midwife

## 2020-06-06 VITALS — BP 106/74 | HR 104 | Ht 66.0 in | Wt 280.2 lb

## 2020-06-06 DIAGNOSIS — Z01419 Encounter for gynecological examination (general) (routine) without abnormal findings: Secondary | ICD-10-CM | POA: Insufficient documentation

## 2020-06-06 DIAGNOSIS — Z113 Encounter for screening for infections with a predominantly sexual mode of transmission: Secondary | ICD-10-CM | POA: Insufficient documentation

## 2020-06-06 MED ORDER — MEGESTROL ACETATE 40 MG PO TABS: 40.0000 mg | ORAL_TABLET | Freq: Three times a day (TID) | ORAL | 3 refills | Status: DC

## 2020-06-06 NOTE — Progress Notes (Signed)
GYNECOLOGY ANNUAL PREVENTATIVE CARE ENCOUNTER NOTE  Subjective:   Regina Watkins is a 35 y.o. G61P0010 female here for a routine annual gynecologic exam.  Current complaints: increased hair shedding that she thinks is related to the megace.   Denies abnormal vaginal bleeding, discharge, pelvic pain, problems with intercourse or other gynecologic concerns.   Patient doesn't really want to continue megace. She is considering a pregnancy. On review of chart with patient's irregular periods and insulin resistance she likely has PCOS, and could benefit form metformin if she was wanting to conceive.    Gynecologic History No LMP recorded. (Menstrual status: Irregular Periods). Contraception: none Last Pap: 04/2019. Results were: normal Last mammogram: NA, age.   Obstetric History OB History  Gravida Para Term Preterm AB Living  1 0 0 0 1 0  SAB IAB Ectopic Multiple Live Births  0 0 1 0 0    # Outcome Date GA Lbr Len/2nd Weight Sex Delivery Anes PTL Lv  1 Ectopic 04/22/07            Past Medical History:  Diagnosis Date  . Anemia   . Back pain   . BV (bacterial vaginosis)   . Diabetes (HCC)   . Ectopic pregnancy   . Infection    UTI  . No pertinent past medical history   . Obesity   . Prediabetes   . Sleep apnea   . Strep throat   . Trichomonas     Past Surgical History:  Procedure Laterality Date  . SALPINGECTOMY     removal of ectopic preg and tube  . TUBAL LIGATION N/A    Phreesia 10/25/2019    Current Outpatient Medications on File Prior to Visit  Medication Sig Dispense Refill  . ferrous sulfate 325 (65 FE) MG EC tablet 1 po bid with food 60 tablet 0  . megestrol (MEGACE) 40 MG tablet Take 3 tablets (120 mg total) by mouth daily. 90 tablet 1  . Vitamin D, Ergocalciferol, (DRISDOL) 1.25 MG (50000 UNIT) CAPS capsule Take 1 capsule (50,000 Units total) by mouth every 7 (seven) days. 4 capsule 0   No current facility-administered medications on file prior to visit.     Allergies  Allergen Reactions  . Latex Other (See Comments)    Latex condoms cause bacterial infections and irritation.    Social History   Socioeconomic History  . Marital status: Single    Spouse name: Not on file  . Number of children: Not on file  . Years of education: Not on file  . Highest education level: Not on file  Occupational History  . Occupation: [employer]  Tobacco Use  . Smoking status: Never Smoker  . Smokeless tobacco: Never Used  Vaping Use  . Vaping Use: Never used  Substance and Sexual Activity  . Alcohol use: No  . Drug use: No  . Sexual activity: Yes    Birth control/protection: Condom    Comment: monogamous rlship wiht female partner of 8 years; last intercourse one month ago  Other Topics Concern  . Not on file  Social History Narrative  . Not on file   Social Determinants of Health   Financial Resource Strain: Not on file  Food Insecurity: No Food Insecurity  . Worried About Programme researcher, broadcasting/film/video in the Last Year: Never true  . Ran Out of Food in the Last Year: Never true  Transportation Needs: No Transportation Needs  . Lack of Transportation (Medical): No  . Lack of  Transportation (Non-Medical): No  Physical Activity: Not on file  Stress: Not on file  Social Connections: Not on file  Intimate Partner Violence: Not on file    Family History  Problem Relation Age of Onset  . Heart disease Father        CHF  . Hypertension Father   . Sleep apnea Father   . Diabetes Maternal Grandmother   . Hypertension Maternal Grandmother   . Cancer Maternal Grandmother        breast  . Cancer Paternal Grandmother   . Hypertension Mother   . Thyroid disease Mother   . Anesthesia problems Neg Hx   . Hypotension Neg Hx   . Malignant hyperthermia Neg Hx   . Pseudochol deficiency Neg Hx     The following portions of the patient's history were reviewed and updated as appropriate: allergies, current medications, past family history, past medical  history, past social history, past surgical history and problem list.  Review of Systems Pertinent items noted in HPI and remainder of comprehensive ROS otherwise negative.   Objective:  BP 106/74   Pulse (!) 104   Ht 5\' 6"  (1.676 m)   Wt 280 lb 3.2 oz (127.1 kg)   BMI 45.23 kg/m  CONSTITUTIONAL: Well-developed, well-nourished female in no acute distress.  HENT:  Normocephalic, atraumatic, External right and left ear normal. Oropharynx is clear and moist EYES: Conjunctivae and EOM are normal. Pupils are equal, round, and reactive to light. No scleral icterus.  NECK: Normal range of motion, supple, no masses.  Normal thyroid.  SKIN: Skin is warm and dry. No rash noted. Not diaphoretic. No erythema. No pallor. NEUROLOGIC: Alert and oriented to person, place, and time. Normal reflexes, muscle tone coordination. No cranial nerve deficit noted. PSYCHIATRIC: Normal mood and affect. Normal behavior. Normal judgment and thought content. CARDIOVASCULAR: Normal heart rate noted, regular rhythm RESPIRATORY: Clear to auscultation bilaterally. Effort and breath sounds normal, no problems with respiration noted. ABDOMEN: Soft, normal bowel sounds, no distention noted.  No tenderness, rebound or guarding.  MUSCULOSKELETAL: Normal range of motion. No tenderness.  No cyanosis, clubbing, or edema.  2+ distal pulses.   Assessment and Plan:  1. Well woman exam with routine gynecological exam - Cervicovaginal ancillary only( [place]) - HIV Antibody (routine testing w rflx) - RPR - Hepatitis C Antibody  2. Screening for STDs (sexually transmitted diseases) - Cervicovaginal ancillary only( ) - HIV Antibody (routine testing w rflx) - RPR - Hepatitis C Antibody  3. Likely PCOS - DW patient that if she wanted to conceive we could do a trial with metformin to see if this helps. If she decides she doesn't want to conceive she is leaning towards having a hysterectomy. She doesn't want to be  on hormones for forever to regulate her cycles so she would prefer this than anything else to control her cycles. DW patient that ultimately she is the only one that can make the decision regarding she would like to conceive or not, but that we would support her in her choice. If she would like to try then 6 months of metformin would be prudent to see if this helps.   Patient will call if she desires metformin.   Patient had pap 04/2019, not due for pap at this time Routine preventative health maintenance measures emphasized. Please refer to After Visit Summary for other counseling recommendations.    05/2019 DNP, CNM  06/06/20  2:32 PM

## 2020-06-07 ENCOUNTER — Ambulatory Visit (INDEPENDENT_AMBULATORY_CARE_PROVIDER_SITE_OTHER): Payer: 59 | Admitting: Internal Medicine

## 2020-06-07 ENCOUNTER — Encounter: Payer: Self-pay | Admitting: Internal Medicine

## 2020-06-07 VITALS — BP 103/74 | HR 89 | Temp 97.5°F | Resp 16 | Wt 277.0 lb

## 2020-06-07 DIAGNOSIS — Z13228 Encounter for screening for other metabolic disorders: Secondary | ICD-10-CM | POA: Diagnosis not present

## 2020-06-07 DIAGNOSIS — Z0000 Encounter for general adult medical examination without abnormal findings: Secondary | ICD-10-CM | POA: Diagnosis not present

## 2020-06-07 DIAGNOSIS — E1169 Type 2 diabetes mellitus with other specified complication: Secondary | ICD-10-CM | POA: Diagnosis not present

## 2020-06-07 DIAGNOSIS — E559 Vitamin D deficiency, unspecified: Secondary | ICD-10-CM

## 2020-06-07 DIAGNOSIS — Z0289 Encounter for other administrative examinations: Secondary | ICD-10-CM

## 2020-06-07 DIAGNOSIS — G4733 Obstructive sleep apnea (adult) (pediatric): Secondary | ICD-10-CM

## 2020-06-07 DIAGNOSIS — Z130 Encounter for screening for diseases of the blood and blood-forming organs and certain disorders involving the immune mechanism: Secondary | ICD-10-CM

## 2020-06-07 LAB — HEPATITIS C ANTIBODY: Hep C Virus Ab: <0.1 s/co ratio (ref 0.0–0.9)

## 2020-06-07 LAB — CERVICOVAGINAL ANCILLARY ONLY
Bacterial Vaginitis (gardnerella): NEGATIVE
Candida Glabrata: NEGATIVE
Candida Vaginitis: NEGATIVE
Chlamydia: NEGATIVE
Comment: NEGATIVE
Comment: NEGATIVE
Comment: NEGATIVE
Comment: NEGATIVE
Comment: NEGATIVE
Comment: NORMAL
Neisseria Gonorrhea: NEGATIVE
Trichomonas: NEGATIVE

## 2020-06-07 LAB — HIV ANTIBODY (ROUTINE TESTING W REFLEX): HIV Screen 4th Generation wRfx: NONREACTIVE

## 2020-06-07 LAB — RPR: RPR Ser Ql: NONREACTIVE

## 2020-06-07 NOTE — Progress Notes (Addendum)
Subjective:    Regina Watkins - 35 y.o. female MRN [id number]  Date of birth: 1985-12-11  HPI  [name] T Kretz is here for annual exam. Has no concerns today. Saw GYN yesterday and had PAP and STD testing. Would like breast exam today.      Health Maintenance:  Health Maintenance Due  Topic Date Due  . PNEUMOCOCCAL POLYSACCHARIDE VACCINE AGE 58-64 HIGH RISK  Never done  . COVID-19 Vaccine (1) Never done  . OPHTHALMOLOGY EXAM  Never done  . HEMOGLOBIN A1C  04/30/2020  . URINE MICROALBUMIN  07/21/2020    -  reports that she has never smoked. She has never used smokeless tobacco. - Review of Systems: Per HPI. - Past Medical History: Patient Active Problem List   Diagnosis Date Noted  . OSA (obstructive sleep apnea) 11/16/2019  . Family history of premature coronary heart disease 11/16/2019  . Diabetes mellitus (HCC) 07/22/2019  . Mastalgia 06/11/2011  . 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 History of ectopic pregnancy 06/11/2011   - Medications: reviewed and updated   Objective:   Physical Exam BP 103/74 (BP Location: Right Arm, Patient Position: Sitting, Cuff Size: Large)   Pulse 89   Temp (!) 97.5 F (36.4 C)   Resp 16   Wt 277 lb (125.6 kg)   LMP 05/14/2020 (Approximate)   SpO2 97%   BMI 44.71 kg/m  Physical Exam Constitutional:      Appearance: She is not diaphoretic.  HENT:     Head: Normocephalic and atraumatic.  Eyes:     Conjunctiva/sclera: Conjunctivae normal.     Pupils: Pupils are equal, round, and reactive to light.  Neck:     Thyroid: No thyromegaly.  Cardiovascular:     Rate and Rhythm: Normal rate and regular rhythm.     Heart sounds: Normal heart sounds. No murmur heard.   Pulmonary:     Effort: Pulmonary effort is normal. No respiratory distress.     Breath sounds: Normal breath sounds. No wheezing.  Chest:     Comments: Breast exam: Breasts symmetric. No erythema/rashes/skin color changes/skin texture changes/nipple  discharge. No discrete palpable masses appreciated in axilla or 4 quadrants of the breast bilaterally.  Abdominal:     General: Bowel sounds are normal. There is no distension.     Palpations: Abdomen is soft.     Tenderness: There is no abdominal tenderness. There is no guarding or rebound.  Musculoskeletal:        General: No deformity. Normal range of motion.     Cervical back: Normal range of motion and neck supple.  Lymphadenopathy:     Cervical: No cervical adenopathy.  Skin:    General: Skin is warm and dry.     Findings: No rash.  Neurological:     Mental Status: She is alert and oriented to person, place, and time.     Gait: Gait is intact.  Psychiatric:        Mood and Affect: Mood and affect normal.        Judgment: Judgment normal.            Assessment & Plan:   1. Encounter for annual physical exam Counseled on 150 minutes of exercise per week, healthy eating (including decreased daily intake of saturated fats, cholesterol, added sugars, sodium), STI prevention, routine healthcare maintenance.  2. Type 2 diabetes mellitus with other specified complication, without long-term current use of insulin (HCC) Last A1c 6.2, she is diet controlled. Will  repeat today since last monitored >6 months ago.  Counseled on Diabetic diet, my plate method, 258 minutes of moderate intensity exercise/week Blood sugar logs with fasting goals of 80-120 mg/dl, random of less than 527 and in the event of sugars less than 60 mg/dl or greater than 782 mg/dl encouraged to notify the clinic. Advised on the need for annual eye exams, annual foot exams, Pneumonia vaccine. - Hemoglobin A1c - Microalbumin / creatinine urine ratio - HM DIABETES FOOT EXAM - Ambulatory referral to Ophthalmology  3. Vitamin D deficiency Vit D 18 in Sept 2021. Completed weekly supplementation. Monitor today.  - VITAMIN D 25 Hydroxy (Vit-D Deficiency, Fractures)  4. Screening for metabolic disorder -  Comprehensive metabolic panel  5. Screening for deficiency anemia Remote history of iron deficiency anemia. Monitor with exam today.  - CBC  6. Encounter for completion of form with patient Renewal paperwork for patient to participate in foster care system completed.   7. OSA (obstructive sleep apnea) Discussed with patient that sleep study in Oct 2021 did show sleep apnea with recommended CPAP therapy on 14 cm H20 or autopap 10-20. Unfortunately, patient never received CPAP machine for home use. Will re-order CPAP now.  - For home use only DME continuous positive airway pressure (CPAP)  Marcy Siren, D.O. 06/07/2020, 2:59 PM Primary [facility] at [facility]

## 2020-06-07 NOTE — Patient Instructions (Signed)
Preventive Care 21-35 Years Old, Female Preventive care refers to lifestyle choices and visits with your health care provider that can promote health and wellness. This includes:  A yearly physical exam. This is also called an annual wellness visit.  Regular dental and eye exams.  Immunizations.  Screening for certain conditions.  Healthy lifestyle choices, such as: ? Eating a healthy diet. ? Getting regular exercise. ? Not using drugs or products that contain nicotine and tobacco. ? Limiting alcohol use. What can I expect for my preventive care visit? Physical exam Your health care provider may check your:  Height and weight. These may be used to calculate your BMI (body mass index). BMI is a measurement that tells if you are at a healthy weight.  Heart rate and blood pressure.  Body temperature.  Skin for abnormal spots. Counseling Your health care provider may ask you questions about your:  Past medical problems.  Family's medical history.  Alcohol, tobacco, and drug use.  Emotional well-being.  Home life and relationship well-being.  Sexual activity.  Diet, exercise, and sleep habits.  Work and work environment.  Access to firearms.  Method of birth control.  Menstrual cycle.  Pregnancy history. What immunizations do I need? Vaccines are usually given at various ages, according to a schedule. Your health care provider will recommend vaccines for you based on your age, medical history, and lifestyle or other factors, such as travel or where you work.   What tests do I need? Blood tests  Lipid and cholesterol levels. These may be checked every 5 years starting at age 20.  Hepatitis C test.  Hepatitis B test. Screening  Diabetes screening. This is done by checking your blood sugar (glucose) after you have not eaten for a while (fasting).  STD (sexually transmitted disease) testing, if you are at risk.  BRCA-related cancer screening. This may be  done if you have a family history of breast, ovarian, tubal, or peritoneal cancers.  Pelvic exam and Pap test. This may be done every 3 years starting at age 21. Starting at age 30, this may be done every 5 years if you have a Pap test in combination with an HPV test. Talk with your health care provider about your test results, treatment options, and if necessary, the need for more tests.   Follow these instructions at home: Eating and drinking  Eat a healthy diet that includes fresh fruits and vegetables, whole grains, lean protein, and low-fat dairy products.  Take vitamin and mineral supplements as recommended by your health care provider.  Do not drink alcohol if: ? Your health care provider tells you not to drink. ? You are pregnant, may be pregnant, or are planning to become pregnant.  If you drink alcohol: ? Limit how much you have to 0-1 drink a day. ? Be aware of how much alcohol is in your drink. In the [place] one drink equals one 12 oz bottle of beer (355 mL), one 5 oz glass of wine (148 mL), or one 1 oz glass of hard liquor (44 mL).   Lifestyle  Take daily care of your teeth and gums. Brush your teeth every morning and night with fluoride toothpaste. Floss one time each day.  Stay active. Exercise for at least 30 minutes 5 or more days each week.  Do not use any products that contain nicotine or tobacco, such as cigarettes, e-cigarettes, and chewing tobacco. If you need help quitting, ask your health care provider.  Do not   use drugs.  If you are sexually active, practice safe sex. Use a condom or other form of protection to prevent STIs (sexually transmitted infections).  If you do not wish to become pregnant, use a form of birth control. If you plan to become pregnant, see your health care provider for a prepregnancy visit.  Find healthy ways to cope with stress, such as: ? Meditation, yoga, or listening to music. ? Journaling. ? Talking to a trusted  person. ? Spending time with friends and family. Safety  Always wear your seat belt while driving or riding in a vehicle.  Do not drive: ? If you have been drinking alcohol. Do not ride with someone who has been drinking. ? When you are tired or distracted. ? While texting.  Wear a helmet and other protective equipment during sports activities.  If you have firearms in your house, make sure you follow all gun safety procedures.  Seek help if you have been physically or sexually abused. What's next?  Go to your health care provider once a year for an annual wellness visit.  Ask your health care provider how often you should have your eyes and teeth checked.  Stay up to date on all vaccines. This information is not intended to replace advice given to you by your health care provider. Make sure you discuss any questions you have with your health care provider. Document Revised: 10/17/2019 Document Reviewed: 10/30/2017 Elsevier Patient Education  2021 Elsevier Inc.  

## 2020-06-07 NOTE — Progress Notes (Signed)
Breast exam  No pap Needs form filled out for foster care

## 2020-06-07 NOTE — Addendum Note (Signed)
Addended by: De Hollingshead on: 06/07/2020 03:00 PM   Modules accepted: Orders

## 2020-06-08 ENCOUNTER — Other Ambulatory Visit: Payer: Self-pay | Admitting: Internal Medicine

## 2020-06-08 DIAGNOSIS — E559 Vitamin D deficiency, unspecified: Secondary | ICD-10-CM

## 2020-06-08 LAB — COMPREHENSIVE METABOLIC PANEL
ALT: 20 IU/L (ref 0–32)
AST: 16 IU/L (ref 0–40)
Albumin/Globulin Ratio: 1.1 — ABNORMAL LOW (ref 1.2–2.2)
Albumin: 4 g/dL (ref 3.8–4.8)
Alkaline Phosphatase: 87 IU/L (ref 44–121)
BUN/Creatinine Ratio: 14 (ref 9–23)
BUN: 10 mg/dL (ref 6–20)
Bilirubin Total: 0.6 mg/dL (ref 0.0–1.2)
CO2: 19 mmol/L — ABNORMAL LOW (ref 20–29)
Calcium: 9.1 mg/dL (ref 8.7–10.2)
Chloride: 104 mmol/L (ref 96–106)
Creatinine, Ser: 0.71 mg/dL (ref 0.57–1.00)
Globulin, Total: 3.7 g/dL (ref 1.5–4.5)
Glucose: 91 mg/dL (ref 65–99)
Potassium: 3.8 mmol/L (ref 3.5–5.2)
Sodium: 140 mmol/L (ref 134–144)
Total Protein: 7.7 g/dL (ref 6.0–8.5)
eGFR: 114 mL/min/{1.73_m2} (ref >59)

## 2020-06-08 LAB — CBC
Hematocrit: 37.4 % (ref 34.0–46.6)
Hemoglobin: 12 g/dL (ref 11.1–15.9)
MCH: 24 pg — ABNORMAL LOW (ref 26.6–33.0)
MCHC: 32.1 g/dL (ref 31.5–35.7)
MCV: 75 fL — ABNORMAL LOW (ref 79–97)
Platelets: 353 10*3/uL (ref 150–450)
RBC: 5 x10E6/uL (ref 3.77–5.28)
RDW: 16.1 % — ABNORMAL HIGH (ref 11.7–15.4)
WBC: 7.2 10*3/uL (ref 3.4–10.8)

## 2020-06-08 LAB — VITAMIN D 25 HYDROXY (VIT D DEFICIENCY, FRACTURES): Vit D, 25-Hydroxy: 15.1 ng/mL — ABNORMAL LOW (ref 30.0–100.0)

## 2020-06-08 LAB — HEMOGLOBIN A1C
Est. average glucose Bld gHb Est-mCnc: 137 mg/dL
Hgb A1c MFr Bld: 6.4 % — ABNORMAL HIGH (ref 4.8–5.6)

## 2020-06-08 LAB — MICROALBUMIN / CREATININE URINE RATIO
Creatinine, Urine: 95.5 mg/dL
Microalb/Creat Ratio: 14 mg/g creat (ref 0–29)
Microalbumin, Urine: 13.2 ug/mL

## 2020-06-08 MED ORDER — VITAMIN D (ERGOCALCIFEROL) 1.25 MG (50000 UNIT) PO CAPS
50000.0000 [IU] | ORAL_CAPSULE | ORAL | 0 refills | Status: DC
Start: 2020-06-08 — End: 2021-06-11

## 2020-07-11 ENCOUNTER — Ambulatory Visit: Payer: 59

## 2020-08-09 ENCOUNTER — Encounter (INDEPENDENT_AMBULATORY_CARE_PROVIDER_SITE_OTHER): Payer: 59 | Admitting: Ophthalmology

## 2020-08-09 IMAGING — CR DG FOOT COMPLETE 3+V*R*
3 series · 3 of 3 positions shown · non-contrast
Comparison: Right foot radiograph dated 09/02/2013.

CLINICAL DATA: 33-year-old female with trauma to the right foot.

EXAM:
RIGHT FOOT COMPLETE - 3+ VIEW

[x foot ap right]
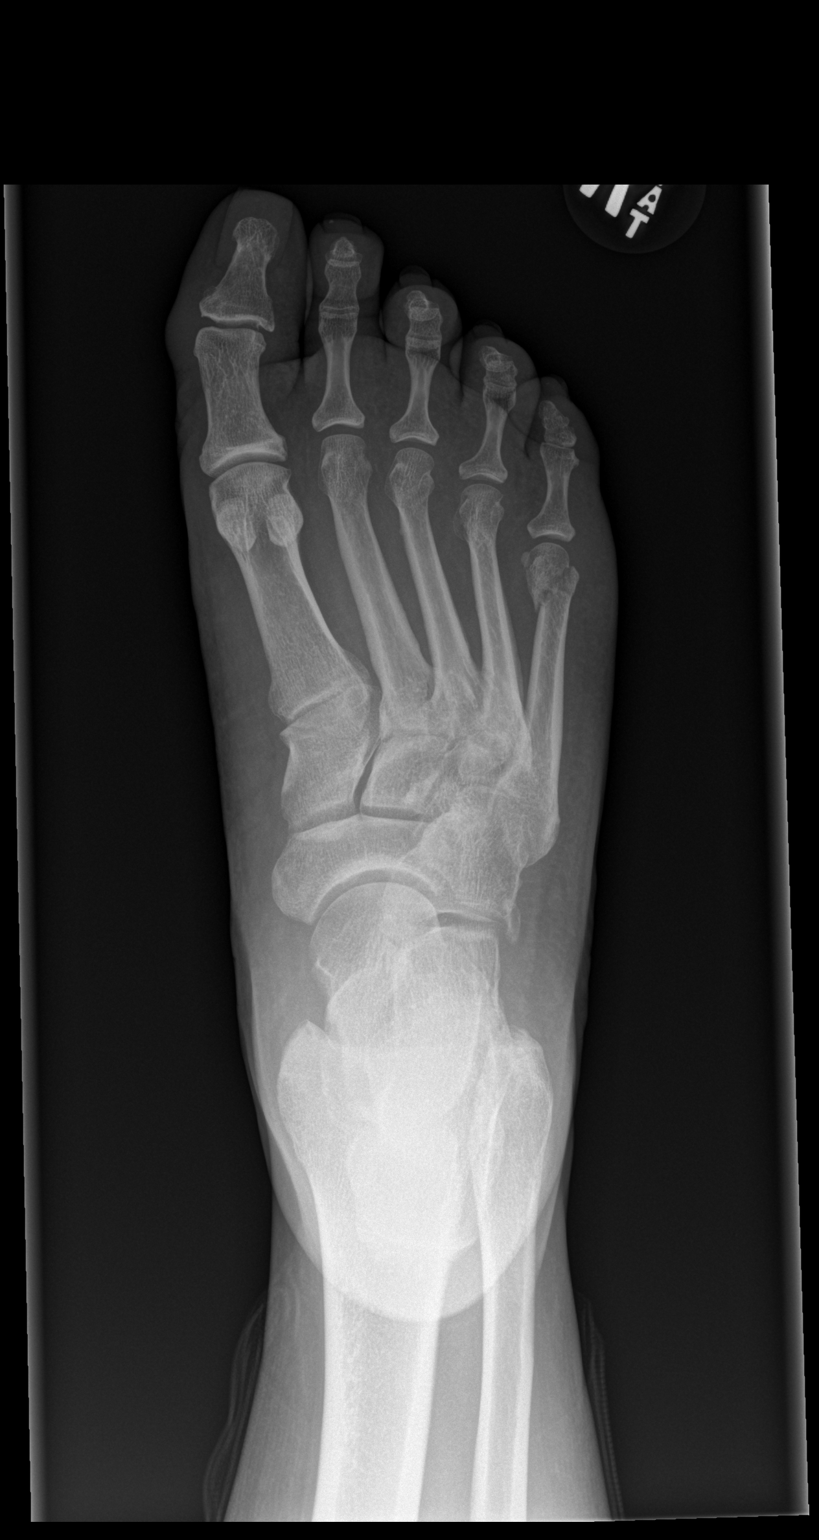

[x foot obl right]
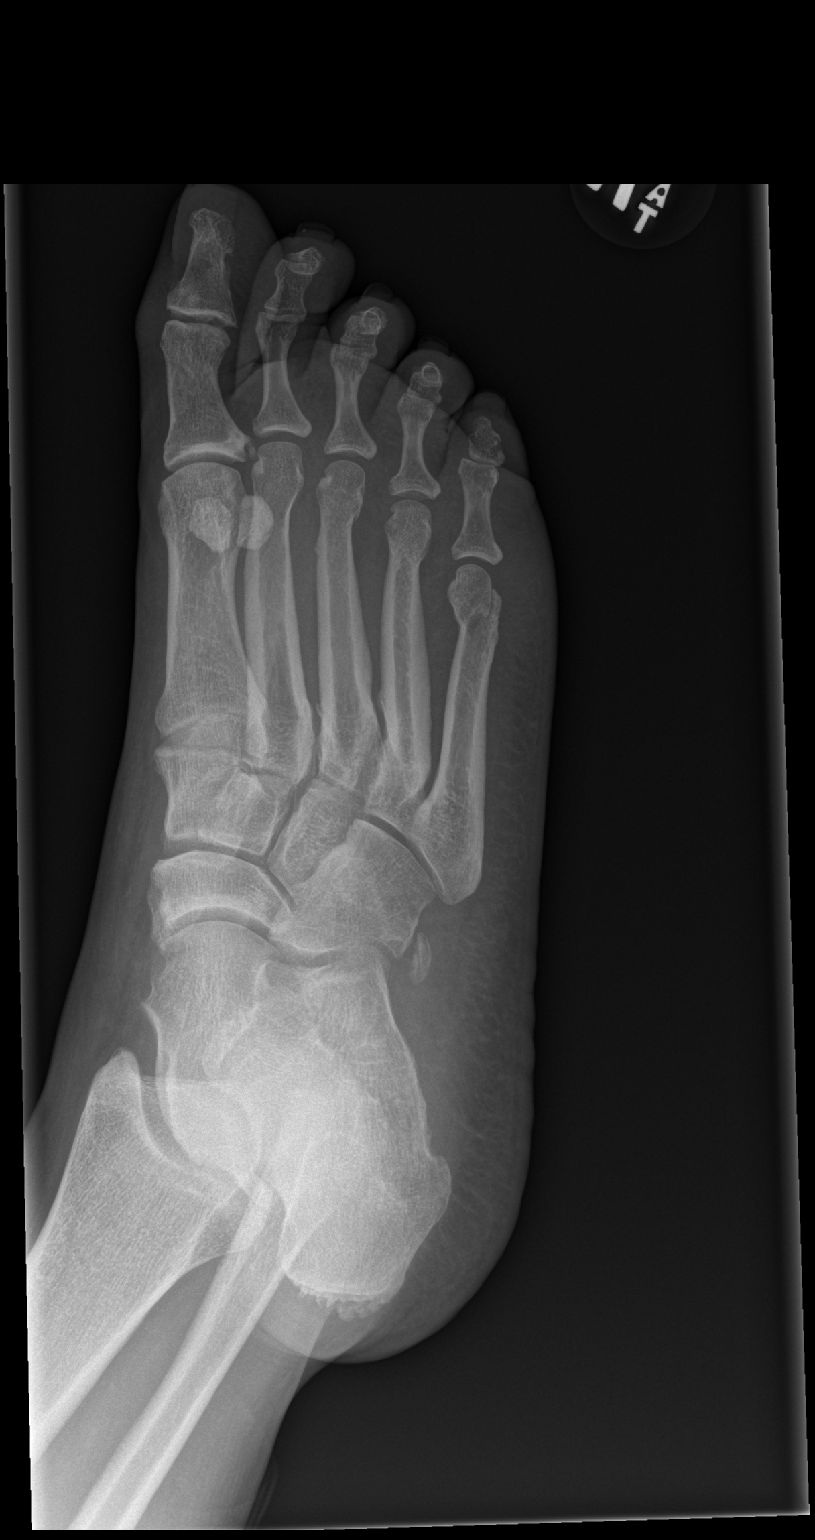

[x foot lat right]
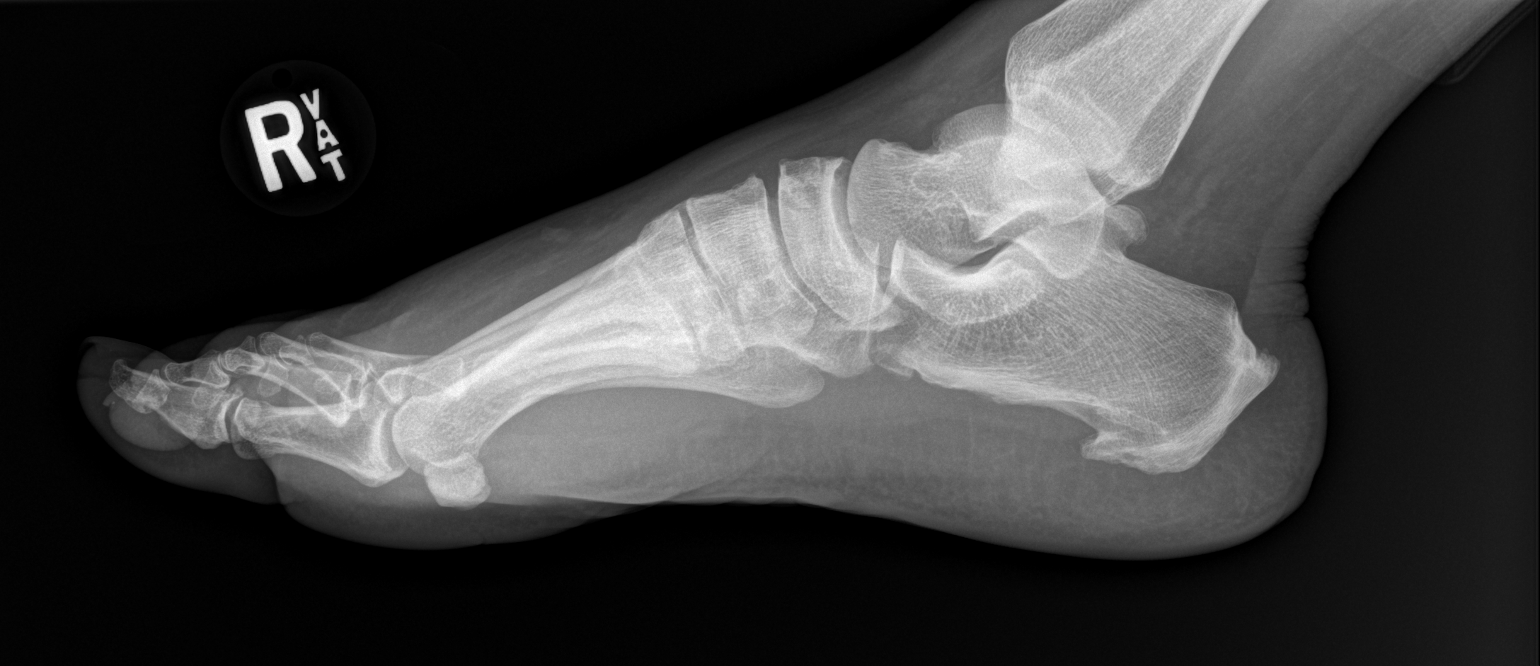

[3 of 3 positions shown; findings below may reference images not displayed]

FINDINGS: There is a minimally displaced oblique fracture of the fifth
metatarsal head. No other acute fracture identified. There is no
dislocation. Minimal spurring of the dorsal tarsal bones. The soft
tissues are unremarkable.
IMPRESSION: Minimally displaced oblique fracture of the fifth metatarsal head.

## 2020-08-30 ENCOUNTER — Encounter (INDEPENDENT_AMBULATORY_CARE_PROVIDER_SITE_OTHER): Payer: 59 | Admitting: Ophthalmology

## 2021-04-06 ENCOUNTER — Ambulatory Visit (INDEPENDENT_AMBULATORY_CARE_PROVIDER_SITE_OTHER): Payer: 59

## 2021-04-06 ENCOUNTER — Other Ambulatory Visit: Payer: Self-pay

## 2021-04-06 ENCOUNTER — Other Ambulatory Visit (HOSPITAL_COMMUNITY)
Admission: RE | Admit: 2021-04-06 | Discharge: 2021-04-06 | Disposition: A | Discharge status: Home / Self Care | Payer: 59 | Source: Ambulatory Visit | Attending: Family Medicine | Admitting: Family Medicine

## 2021-04-06 VITALS — BP 123/80 | HR 76 | Wt 289.7 lb

## 2021-04-06 DIAGNOSIS — N898 Other specified noninflammatory disorders of vagina: Secondary | ICD-10-CM | POA: Insufficient documentation

## 2021-04-06 DIAGNOSIS — Z113 Encounter for screening for infections with a predominantly sexual mode of transmission: Secondary | ICD-10-CM

## 2021-04-06 NOTE — Progress Notes (Signed)
Here today with complaint of abnormal vaginal discharge after using a friend's soap. Patient states she normally uses unscented Dove soap to prevent BV, is concerned that using the different soap caused infection. Denies any vaginal odor or itching. Pt desires vaginal swab to check for STD, BV, and yeast. States she has more clear vaginal discharge than normal and notices this each time before developing BV. Self swab instructions given and specimen obtained. Explained pt will be contacted with any abnormal results. Pt to return for annual visit in April.   Fleet Contras RN 04/06/21

## 2021-04-09 ENCOUNTER — Telehealth: Payer: Self-pay | Admitting: Family Medicine

## 2021-04-09 DIAGNOSIS — B9689 Other specified bacterial agents as the cause of diseases classified elsewhere: Secondary | ICD-10-CM

## 2021-04-09 LAB — CERVICOVAGINAL ANCILLARY ONLY
Bacterial Vaginitis (gardnerella): POSITIVE — AB
Candida Glabrata: NEGATIVE
Candida Vaginitis: NEGATIVE
Chlamydia: NEGATIVE
Comment: NEGATIVE
Comment: NEGATIVE
Comment: NEGATIVE
Comment: NEGATIVE
Comment: NEGATIVE
Comment: NORMAL
Neisseria Gonorrhea: NEGATIVE
Trichomonas: NEGATIVE

## 2021-04-09 MED ORDER — METRONIDAZOLE 500 MG PO TABS: 500.0000 mg | ORAL_TABLET | Freq: Two times a day (BID) | ORAL | 0 refills | Status: DC

## 2021-04-09 NOTE — Telephone Encounter (Signed)
Called pt; reviewed vaginal swab result 04/06/21 positive for BV. Flagyl sent per protocol.

## 2021-04-09 NOTE — Telephone Encounter (Signed)
Patient calling about test results.  

## 2021-04-19 ENCOUNTER — Other Ambulatory Visit: Payer: Self-pay | Admitting: Obstetrics and Gynecology

## 2021-04-19 ENCOUNTER — Telehealth: Payer: Self-pay

## 2021-04-19 DIAGNOSIS — T3695XA Adverse effect of unspecified systemic antibiotic, initial encounter: Secondary | ICD-10-CM

## 2021-04-19 DIAGNOSIS — B9689 Other specified bacterial agents as the cause of diseases classified elsewhere: Secondary | ICD-10-CM

## 2021-04-19 DIAGNOSIS — B379 Candidiasis, unspecified: Secondary | ICD-10-CM

## 2021-04-19 MED ORDER — FLUCONAZOLE 150 MG PO TABS: 150.0000 mg | ORAL_TABLET | Freq: Once | ORAL | 0 refills | Status: AC

## 2021-04-19 NOTE — Telephone Encounter (Signed)
Regina Watkins office transferred patient's call to me. Patient stated she was calling because she was prescribed an antibiotic for BV. Patient states she is now developing a yeast infection after taking antibiotic. Patient requested treatment for yeast infection. I informed patient Diflucan will be sent to her pharmacy. Patient verbalized understanding and denies any other concerns or questions.   Paulina Fusi, RN 04/19/21

## 2021-06-11 NOTE — Progress Notes (Signed)
? ?ANNUAL EXAM ?Patient name: Regina Watkins MRN [id number]  Date of birth: January 14, 1986 ?Chief Complaint:   ?Gynecologic Exam ? ?History of Present Illness:   ?Regina Watkins is a 36 y.o. G78P0010 female being seen today for a routine annual exam.  ? ?Current complaints: Notes vaginal discharge today - itching but no odor.  ? ?She does get BV intermittently, recently treated in February with yeast infection after that responded to diflucan.  ? ?No period since December. She has not done UPT. Tender breasts but worse on the left. She used to do Megace but had hair loss on it so she takes it when heavy bleeding.  ? ? ?No LMP recorded. (Menstrual status: Irregular Periods). ? ? ?The pregnancy intention screening data noted above was reviewed. Potential methods of contraception were discussed. The patient elected to proceed with No data recorded.  ? ?Last pap 04/2019. Results were: NILM w/ HRHPV negative. H/O abnormal pap: no ? ?Health Maintenance Due  ?Topic Date Due  ? COVID-19 Vaccine (1) Never done  ? OPHTHALMOLOGY EXAM  Never done  ? HEMOGLOBIN A1C  12/07/2020  ? FOOT EXAM  06/07/2021  ? URINE MICROALBUMIN  06/07/2021  ? ? ? ? ?  06/07/2020  ?  3:01 PM 06/06/2020  ?  2:28 PM 11/16/2019  ?  9:38 AM 08/26/2019  ?  4:23 PM 05/12/2019  ?  9:48 AM  ?Depression screen PHQ 2/9  ?Decreased Interest 0 1 1 0 0  ?Down, Depressed, Hopeless 0 0 0 0 0  ?PHQ - 2 Score 0 1 1 0 0  ?Altered sleeping 0 1 1 1    ?Tired, decreased energy 0 1 2 1    ?Change in appetite 0 0 0 0   ?Feeling bad or failure about yourself  0 0 1 0   ?Trouble concentrating 0 0 1 0   ?Moving slowly or fidgety/restless 0 0 0 0   ?Suicidal thoughts 0 0 0 0   ?PHQ-9 Score 0 3 6 2    ? ?  ? ?  06/07/2020  ?  3:01 PM 06/06/2020  ?  2:28 PM 08/26/2019  ?  4:23 PM  ?GAD 7 : Generalized Anxiety Score  ?Nervous, Anxious, on Edge 0 0 0  ?Control/stop worrying 0 0 0  ?Worry too much - different things 0 0 0  ?Trouble relaxing 0 1 0  ?Restless 0 0 0  ?Easily annoyed or irritable 0 0  0  ?Afraid - awful might happen 0 0 0  ?Total GAD 7 Score 0 1 0  ? ? ? ?Review of Systems:   ?Pertinent items are noted in HPI ?Denies any headaches, blurred vision, fatigue, shortness of breath, chest pain, abdominal pain, bowel movements, urination, or intercourse unless otherwise stated above.  ?Pertinent History Reviewed:  ?Reviewed past medical,surgical, social and family history.  ?Reviewed problem list, medications and allergies. ?Physical Assessment:  ? ?Vitals:  ? 06/13/21 1454  ?BP: 115/78  ?Pulse: 83  ?Weight: 292 lb 9.6 oz (132.7 kg)  ?Body mass index is 47.23 kg/m?. ?  ?Physical Examination:  ?General appearance - well appearing, and in no distress ?Mental status - alert, oriented to person, place, and time ?Psych:  She has a normal mood and affect ?Skin - warm and dry, normal color, no suspicious lesions noted ?Chest - effort normal, all lung fields clear to auscultation bilaterally ?Heart - normal rate and regular rhythm ?Neck:  midline trachea, no thyromegaly or nodules ?Breasts - breasts appear normal,  no suspicious masses, no skin or nipple changes or axillary nodes ?Abdomen - soft, nontender, nondistended, no masses or organomegaly ?Pelvic -  ?VULVA: normal appearing vulva with no masses, tenderness or lesions   ?VAGINA: normal appearing vagina with normal color and discharge, no lesions   ?CERVIX: normal appearing cervix without discharge or lesions, no CMT ?UTERUS: uterus is felt to be normal size, shape, consistency and nontender  ?ADNEXA: No adnexal masses or tenderness noted. ?Extremities:  No swelling or varicosities noted ? ?Chaperone present for exam ? ?No results found for this or any previous visit (from the past 24 hour(s)).  ?Assessment & Plan:  ?[name] was seen today for gynecologic exam. ? ?Diagnoses and all orders for this visit: ? ?Encounter for annual routine gynecological examination ?- Cervical cancer screening: Discussed guidelines. Pap with HPV wnl 04/2019 ?- Gardasil:  Declines ?- GC/CT: accepts ?- Birth Control: Tubal ligation ?- Breast Health: Encouraged self breast awareness/SBE. Teaching provided.  ?- F/U 12 months and prn ?-     Cytology - PAP( [facility]) ? ?Routine screening for STI (sexually transmitted infection) ?-     Cervicovaginal ancillary only( [place]) ?-     HIV Antibody (routine testing w rflx) ?-     RPR ?-     Hepatitis B Surface AntiGEN ?-     Hepatitis C Antibody ? ?Breast tenderness in female ?-     MM 3D SCREEN BREAST BILATERAL; Future ? ?Family history of breast cancer ?-     MM 3D SCREEN BREAST BILATERAL; Future ? ?Vaginal discharge ?-     Cervicovaginal ancillary only( [place]) ? ?Secondary amenorrhea ?-     Progesterone ?-     POCT urine pregnancy ? ? ? ?Orders Placed This Encounter  ?Procedures  ? MM 3D SCREEN BREAST BILATERAL  ? HIV Antibody (routine testing w rflx)  ? RPR  ? Hepatitis B Surface AntiGEN  ? Hepatitis C Antibody  ? Progesterone  ? POCT urine pregnancy  ? ? ?Meds: No orders of the defined types were placed in this encounter. ? ? ?Follow-up: Return in about 1 year (around 06/14/2022), or if symptoms worsen or fail to improve, for annual. ? ?Milas Hock, MD ?06/13/2021 ?3:16 PM ?

## 2021-06-13 ENCOUNTER — Ambulatory Visit (INDEPENDENT_AMBULATORY_CARE_PROVIDER_SITE_OTHER): Payer: 59 | Admitting: Obstetrics and Gynecology

## 2021-06-13 ENCOUNTER — Other Ambulatory Visit (HOSPITAL_COMMUNITY)
Admission: RE | Admit: 2021-06-13 | Discharge: 2021-06-13 | Disposition: A | Discharge status: Home / Self Care | Payer: 59 | Source: Ambulatory Visit | Attending: Obstetrics and Gynecology | Admitting: Obstetrics and Gynecology

## 2021-06-13 ENCOUNTER — Encounter: Payer: Self-pay | Admitting: Obstetrics and Gynecology

## 2021-06-13 VITALS — BP 115/78 | HR 83 | Wt 292.6 lb

## 2021-06-13 DIAGNOSIS — N644 Mastodynia: Secondary | ICD-10-CM | POA: Diagnosis not present

## 2021-06-13 DIAGNOSIS — Z01419 Encounter for gynecological examination (general) (routine) without abnormal findings: Secondary | ICD-10-CM

## 2021-06-13 DIAGNOSIS — Z803 Family history of malignant neoplasm of breast: Secondary | ICD-10-CM

## 2021-06-13 DIAGNOSIS — N898 Other specified noninflammatory disorders of vagina: Secondary | ICD-10-CM | POA: Insufficient documentation

## 2021-06-13 DIAGNOSIS — N911 Secondary amenorrhea: Secondary | ICD-10-CM

## 2021-06-13 DIAGNOSIS — Z113 Encounter for screening for infections with a predominantly sexual mode of transmission: Secondary | ICD-10-CM | POA: Diagnosis not present

## 2021-06-13 LAB — POCT PREGNANCY, URINE: Preg Test, Ur: NEGATIVE

## 2021-06-13 NOTE — Progress Notes (Signed)
Patient complaints: ? ?Vaginal irritation that consists of itch and burning, denies any odor ? ?Patient has not had period in 3-4 months. Last period was late December. [place] has not taken a pregnancy test  ? ?Tender breasts denies pain or discharge from nipples ? ?  ?

## 2021-06-14 ENCOUNTER — Telehealth: Payer: Self-pay | Admitting: Lactation Services

## 2021-06-14 ENCOUNTER — Telehealth: Payer: Self-pay | Admitting: Family Medicine

## 2021-06-14 LAB — CERVICOVAGINAL ANCILLARY ONLY
Bacterial Vaginitis (gardnerella): POSITIVE — AB
Candida Glabrata: NEGATIVE
Candida Vaginitis: NEGATIVE
Chlamydia: NEGATIVE
Comment: NEGATIVE
Comment: NEGATIVE
Comment: NEGATIVE
Comment: NEGATIVE
Comment: NEGATIVE
Comment: NORMAL
Neisseria Gonorrhea: NEGATIVE
Trichomonas: NEGATIVE

## 2021-06-14 LAB — PROGESTERONE: Progesterone: 0.1 ng/mL

## 2021-06-14 LAB — HIV ANTIBODY (ROUTINE TESTING W REFLEX): HIV Screen 4th Generation wRfx: NONREACTIVE

## 2021-06-14 LAB — HEPATITIS B SURFACE ANTIGEN: Hepatitis B Surface Ag: NEGATIVE

## 2021-06-14 LAB — HEPATITIS C ANTIBODY: Hep C Virus Ab: NONREACTIVE

## 2021-06-14 LAB — RPR: RPR Ser Ql: NONREACTIVE

## 2021-06-14 MED ORDER — MEDROXYPROGESTERONE ACETATE 10 MG PO TABS: 10.0000 mg | ORAL_TABLET | Freq: Every day | ORAL | 3 refills | Status: DC

## 2021-06-14 MED ORDER — METRONIDAZOLE 500 MG PO TABS: 500.0000 mg | ORAL_TABLET | Freq: Two times a day (BID) | ORAL | 0 refills | Status: DC

## 2021-06-14 NOTE — Telephone Encounter (Signed)
Returned patient back and reviewed lab results. She is requesting treatment for BV. She was informed about anovulation and that Provera has been sent to pharmacy to take for 7 days to reset periods. Patient voiced understanding with no other questions or concerns at this time.  ?

## 2021-06-14 NOTE — Telephone Encounter (Signed)
-----   Message from Milas Hock, MD sent at 06/14/2021  1:23 PM EDT ----- ?Please let her know her labs show no ovulation so I would recommend provera daily for 7 days and then period should ensue. Hopefully it will reset her period to be back on schedule.  ?Thanks, pad ?

## 2021-06-14 NOTE — Addendum Note (Signed)
Addended by: Milas Hock A on: 06/14/2021 01:23 PM ? ? Modules accepted: Orders ? ?

## 2021-06-14 NOTE — Telephone Encounter (Signed)
Addressed in another telephone encounter.

## 2021-06-14 NOTE — Telephone Encounter (Signed)
Patient called and request a call back from her visit yesterday  ?

## 2021-06-15 LAB — CYTOLOGY - PAP: Diagnosis: NEGATIVE

## 2021-06-20 ENCOUNTER — Telehealth: Payer: Self-pay | Admitting: General Practice

## 2021-06-20 NOTE — Telephone Encounter (Signed)
Called patient with mammogram appt information for [employer]. Discussed [facility] is the only place that accepts her [employer] insurance for mammograms. Reviewed location & appt information. Advised patient to contact her insurance to make sure mammogram would be covered due to her age. Patient verbalized understanding & states she is about to get [employer] but is waiting on her card information. Told patient more places accept [place] provided their information. Patient verbalized understanding & asked about pap & other results. Reviewed normal pap results & Dr [name] Class recommendation on her progesterone levels. Patient verbalized understanding & had no questions. ?

## 2021-07-31 ENCOUNTER — Telehealth: Payer: Self-pay | Admitting: Family Medicine

## 2021-07-31 NOTE — Telephone Encounter (Signed)
Patient state she requested a mammogram and she was told by the office since she have piercing's  they can't do it, she want to know if it can be changed to a ultra sound instead.

## 2021-07-31 NOTE — Telephone Encounter (Signed)
Returned call to patient. Patient answers and states she is at work and cannot talk. Request VM. Pt agreeable to MyChart message.

## 2021-08-09 ENCOUNTER — Ambulatory Visit (INDEPENDENT_AMBULATORY_CARE_PROVIDER_SITE_OTHER): Payer: 59

## 2021-08-09 DIAGNOSIS — Z803 Family history of malignant neoplasm of breast: Secondary | ICD-10-CM

## 2021-08-09 DIAGNOSIS — N644 Mastodynia: Secondary | ICD-10-CM | POA: Diagnosis not present

## 2021-09-25 ENCOUNTER — Other Ambulatory Visit: Payer: Self-pay | Admitting: Advanced Practice Midwife

## 2021-09-26 ENCOUNTER — Telehealth: Payer: Self-pay | Admitting: Family Medicine

## 2021-09-26 DIAGNOSIS — N921 Excessive and frequent menstruation with irregular cycle: Secondary | ICD-10-CM

## 2021-09-26 MED ORDER — MEGESTROL ACETATE 40 MG PO TABS: 40.0000 mg | ORAL_TABLET | Freq: Three times a day (TID) | ORAL | 3 refills | Status: DC

## 2021-09-26 NOTE — Telephone Encounter (Signed)
Refill on Megace    [facility]

## 2021-09-26 NOTE — Telephone Encounter (Signed)
Reviewed with Para March MD who gives verbal order for refill of Megace. Pt calls into front office for update and is notified by front office staff that refill has been sent.

## 2021-10-10 ENCOUNTER — Encounter (INDEPENDENT_AMBULATORY_CARE_PROVIDER_SITE_OTHER): Payer: Self-pay

## 2021-10-11 ENCOUNTER — Encounter (HOSPITAL_COMMUNITY): Payer: Self-pay

## 2021-10-11 ENCOUNTER — Emergency Department (HOSPITAL_COMMUNITY)
Admission: EM | Admit: 2021-10-11 | Discharge: 2021-10-11 | Disposition: A | Discharge status: Home / Self Care | Payer: Commercial Managed Care - HMO | Attending: Emergency Medicine | Admitting: Emergency Medicine

## 2021-10-11 ENCOUNTER — Other Ambulatory Visit: Payer: Self-pay

## 2021-10-11 DIAGNOSIS — N9489 Other specified conditions associated with female genital organs and menstrual cycle: Secondary | ICD-10-CM | POA: Insufficient documentation

## 2021-10-11 DIAGNOSIS — N939 Abnormal uterine and vaginal bleeding, unspecified: Secondary | ICD-10-CM | POA: Insufficient documentation

## 2021-10-11 DIAGNOSIS — Z91040 Latex allergy status: Secondary | ICD-10-CM | POA: Diagnosis not present

## 2021-10-11 LAB — CBC WITH DIFFERENTIAL/PLATELET
Abs Immature Granulocytes: 0.01 10*3/uL (ref 0.00–0.07)
Basophils Absolute: 0 10*3/uL (ref 0.0–0.1)
Basophils Relative: 0 %
Eosinophils Absolute: 0.2 10*3/uL (ref 0.0–0.5)
Eosinophils Relative: 2 %
HCT: 38.3 % (ref 36.0–46.0)
Hemoglobin: 12.8 g/dL (ref 12.0–15.0)
Immature Granulocytes: 0 %
Lymphocytes Relative: 33 %
Lymphs Abs: 2.8 10*3/uL (ref 0.7–4.0)
MCH: 27.4 pg (ref 26.0–34.0)
MCHC: 33.4 g/dL (ref 30.0–36.0)
MCV: 81.8 fL (ref 80.0–100.0)
Monocytes Absolute: 0.5 10*3/uL (ref 0.1–1.0)
Monocytes Relative: 6 %
Neutro Abs: 5 10*3/uL (ref 1.7–7.7)
Neutrophils Relative %: 59 %
Platelets: 333 10*3/uL (ref 150–400)
RBC: 4.68 MIL/uL (ref 3.87–5.11)
RDW: 14.4 % (ref 11.5–15.5)
WBC: 8.6 10*3/uL (ref 4.0–10.5)
nRBC: 0 % (ref 0.0–0.2)

## 2021-10-11 LAB — URINALYSIS, ROUTINE W REFLEX MICROSCOPIC
Bacteria, UA: NONE SEEN
Bilirubin Urine: NEGATIVE
Glucose, UA: NEGATIVE mg/dL
Ketones, ur: NEGATIVE mg/dL
Leukocytes,Ua: NEGATIVE
Nitrite: NEGATIVE
Protein, ur: NEGATIVE mg/dL
Specific Gravity, Urine: 1.015 (ref 1.005–1.030)
pH: 6 (ref 5.0–8.0)

## 2021-10-11 LAB — COMPREHENSIVE METABOLIC PANEL
ALT: 28 U/L (ref 0–44)
AST: 16 U/L (ref 15–41)
Albumin: 3.9 g/dL (ref 3.5–5.0)
Alkaline Phosphatase: 57 U/L (ref 38–126)
Anion gap: 8 (ref 5–15)
BUN: 12 mg/dL (ref 6–20)
CO2: 24 mmol/L (ref 22–32)
Calcium: 9.2 mg/dL (ref 8.9–10.3)
Chloride: 110 mmol/L (ref 98–111)
Creatinine, Ser: 0.77 mg/dL (ref 0.44–1.00)
GFR, Estimated: >60 mL/min (ref >60)
Glucose, Bld: 162 mg/dL — ABNORMAL HIGH (ref 70–99)
Potassium: 3.9 mmol/L (ref 3.5–5.1)
Sodium: 142 mmol/L (ref 135–145)
Total Bilirubin: 1 mg/dL (ref 0.3–1.2)
Total Protein: 7.7 g/dL (ref 6.5–8.1)

## 2021-10-11 LAB — I-STAT BETA HCG BLOOD, ED (MC, WL, AP ONLY): I-stat hCG, quantitative: <5 m[IU]/mL (ref <5)

## 2021-10-11 NOTE — ED Provider Notes (Signed)
[facility]-EMERGENCY DEPT Provider Note   CSN: [id number] Arrival date & time: 10/11/21  2107     History  Chief Complaint  Patient presents with   Vaginal Bleeding    Regina Watkins is a 36 y.o. female.  The history is provided by the patient and medical records.  Vaginal Bleeding Regina Watkins is a 36 y.o. female who presents to the Emergency Department complaining of vaginal bleeding.  She presents to the emergency department for evaluation of 3 weeks of vaginal bleeding.  She has been taking medroxyprogesterone as well as Megace.  1 week ago she had cramping and large blood clots, which are now resolved.  She is on her ninth day of Megace and her bleeding had stopped for 2 days but today there was a little bit of blood when she wiped.  No fevers, shortness of breath, dysuria.  No new sexual partners.  No vaginal discharge.  She is followed by OB/GYN.      Home Medications Prior to Admission medications   Medication Sig Start Date End Date Taking? Authorizing Provider  medroxyPROGESTERone (PROVERA) 10 MG tablet Take 1 tablet (10 mg total) by mouth daily. 06/14/21  Yes Milas Hock, MD  megestrol (MEGACE) 40 MG tablet Take 1 tablet (40 mg total) by mouth in the morning, at noon, and at bedtime. 09/26/21  Yes Milas Hock, MD  metroNIDAZOLE (FLAGYL) 500 MG tablet Take 1 tablet (500 mg total) by mouth 2 (two) times daily. Patient not taking: Reported on 10/11/2021 06/14/21   Milas Hock, MD      Allergies    Latex    Review of Systems   Review of Systems  Genitourinary:  Positive for vaginal bleeding.  All other systems reviewed and are negative.   Physical Exam Updated Vital Signs BP (!) 142/80 (BP Location: Right Arm)   Pulse 80   Temp 98.5 F (36.9 C) (Oral)   Resp 18   Ht 5\' 6"  (1.676 m)   Wt 127 kg   SpO2 100%   BMI 45.19 kg/m  Physical Exam Vitals and nursing note reviewed.  Constitutional:      Appearance: She is  well-developed.  HENT:     Head: Normocephalic and atraumatic.  Cardiovascular:     Rate and Rhythm: Normal rate and regular rhythm.  Pulmonary:     Effort: Pulmonary effort is normal. No respiratory distress.  Abdominal:     Palpations: Abdomen is soft.     Tenderness: There is no abdominal tenderness. There is no guarding or rebound.  Genitourinary:    Comments: Scant blood in vaginal vault.  Os closed.  No CMT. Musculoskeletal:        General: No tenderness.  Skin:    General: Skin is warm and dry.  Neurological:     Mental Status: She is alert and oriented to person, place, and time.  Psychiatric:        Behavior: Behavior normal.     ED Results / Procedures / Treatments   Labs (all labs ordered are listed, but only abnormal results are displayed) Labs Reviewed  COMPREHENSIVE METABOLIC PANEL - Abnormal; Notable for the following components:      Result Value   Glucose, Bld 162 (*)    All other components within normal limits  URINALYSIS, ROUTINE W REFLEX MICROSCOPIC - Abnormal; Notable for the following components:   APPearance HAZY (*)    Hgb urine dipstick MODERATE (*)    All other components  within normal limits  CBC WITH DIFFERENTIAL/PLATELET  I-STAT BETA HCG BLOOD, ED ([facility], [facility], [facility] ONLY)    EKG None  Radiology No results found.  Procedures Procedures    Medications Ordered in ED Medications - No data to display  ED Course/ Medical Decision Making/ A&P                           Medical Decision Making Amount and/or Complexity of Data Reviewed Labs: ordered.   Patient here for evaluation of vaginal bleeding, has been on treatment for this for the last 3 weeks.  She has scant blood on examination, no evidence of hemorrhage or acute infectious process.  Her hemoglobin is normal.  Discussed continuing her current medications and following up with OB/GYN.        Final Clinical Impression(s) / ED Diagnoses Final diagnoses:  Vaginal bleeding     Rx / DC Orders ED Discharge Orders     None         Tilden Fossa, MD 10/12/21 [phone]

## 2021-10-11 NOTE — ED Triage Notes (Signed)
Pt reports with vaginal bleeding with clots x 3 weeks. Pt states that she takes a medication to stop vaginal bleeding but its not working. Pt reports taking birth control as well.

## 2021-10-15 ENCOUNTER — Ambulatory Visit: Payer: 59 | Admitting: Family Medicine

## 2021-10-23 NOTE — Progress Notes (Signed)
Patient ID: Regina Watkins, female    DOB: 02-27-1986  MRN: [id number]  CC: Weight Loss   Subjective: Regina Watkins is a 36 y.o. female who presents for weight loss.  Her concerns today include:  Goal weight 200 pounds. She is trying to monitor what she eats and exercise when able to do so. In the past referred to nutrition class but unable to attend every 2 weeks because of schedule constraints. She does have a family history of thyroid cancer, mother and hypertension. Reports in the past she was diagnosed with diabetes. Reports as time progressed hemoglobin A1c improved to prediabetic numbers and she is not taking any prescriptions for management. She is established with gynecology for abnormal uterine bleeding and reports they plan to recheck her thyroid function soon. No further issues or concerns.    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 (Patient not taking: Reported on 11/01/2021) 7 tablet 3   megestrol (MEGACE) 40 MG tablet Take 1 tablet (40 mg total) by mouth in the morning, at noon, and at bedtime. 90 tablet 3   No current facility-administered medications on file prior to visit.    Allergies  Allergen Reactions   Latex Other (See Comments)    Latex condoms cause bacterial infections and irritation.    Social History   Socioeconomic History   Marital status: Single    Spouse name: Not on file   Number of children: Not on file   Years of education: Not on file   Highest education level: Not on file  Occupational History    Occupation: [employer]  Tobacco Use   Smoking status: Never   Smokeless tobacco: Never  Vaping Use   Vaping Use: Never used  Substance and Sexual Activity   Alcohol use: No   Drug use: No   Sexual activity: Yes    Birth control/protection: Condom    Comment: monogamous rlship wiht female partner of 8 years; last intercourse one month ago  Other Topics Concern   Not on file  Social History Narrative   Not on file   Social Determinants of Health   Financial Resource Strain: Not on file  Food Insecurity: No Food Insecurity (10/31/2021)   Hunger Vital Sign    Worried About Running Out of Food in the Last Year: Never true    Ran Out of Food in the Last Year: Never true  Transportation Needs: No Transportation Needs (10/31/2021)   PRAPARE - Administrator, Civil Service (Medical): No    Lack of Transportation (Non-Medical): No  Physical Activity: Not on file  Stress: Not on file  Social Connections: Not on file  Intimate Partner Violence: Not on file    Family History  Problem Relation Age of Onset   Heart disease Father        CHF   Hypertension Father    Sleep apnea Father    Diabetes Maternal Grandmother    Hypertension Maternal Grandmother    Cancer Maternal Grandmother        breast  Cancer Paternal Grandmother    Hypertension Mother    Thyroid disease Mother    Anesthesia problems Neg Hx    Hypotension Neg Hx    Malignant hyperthermia Neg Hx    Pseudochol deficiency Neg Hx     Past Surgical History:  Procedure Laterality Date   SALPINGECTOMY     removal of ectopic preg and tube   TUBAL LIGATION N/A    Phreesia 10/25/2019    ROS: Review of Systems Negative except as stated above  PHYSICAL EXAM: BP 108/75 (BP Location: Left Arm, Patient Position: Sitting, Cuff Size: Large)   Pulse 78   Temp 98.3 F (36.8 C)   Resp 16   Ht 5\' 6"  (1.676 m)   Wt 284 lb (128.8 kg)   LMP 10/05/2021 (Approximate)   SpO2 96%   BMI 45.84 kg/m   Physical  Exam HENT:     Head: Normocephalic and atraumatic.  Eyes:     Extraocular Movements: Extraocular movements intact.     Conjunctiva/sclera: Conjunctivae normal.     Pupils: Pupils are equal, round, and reactive to light.  Cardiovascular:     Rate and Rhythm: Normal rate and regular rhythm.     Pulses: Normal pulses.     Heart sounds: Normal heart sounds.  Pulmonary:     Effort: Pulmonary effort is normal.     Breath sounds: Normal breath sounds.  Musculoskeletal:     Cervical back: Normal range of motion and neck supple.  Neurological:     General: No focal deficit present.     Mental Status: She is alert and oriented to person, place, and time.  Psychiatric:        Mood and Affect: Mood normal.        Behavior: Behavior normal.    ASSESSMENT AND PLAN: 1. Encounter for weight management - Considering patient's personal and family history referral to Medical Weight Management for further evaluation and management.  - Amb Ref to Medical Weight Management    Patient was given the opportunity to ask questions.  Patient verbalized understanding of the plan and was able to repeat key elements of the plan. Patient was given clear instructions to go to Emergency Department or return to medical center if symptoms don't improve, worsen, or new problems develop.The patient verbalized understanding.   Orders Placed This Encounter  Procedures   Amb Ref to Medical Weight Management     Return in about 4 weeks (around 11/29/2021) for Follow-Up or next available chronic care mgmt with 12/01/2021, MD.  Georganna Skeans, NP

## 2021-10-31 ENCOUNTER — Ambulatory Visit (INDEPENDENT_AMBULATORY_CARE_PROVIDER_SITE_OTHER): Payer: Commercial Managed Care - HMO | Admitting: Family Medicine

## 2021-10-31 ENCOUNTER — Encounter: Payer: Self-pay | Admitting: Family Medicine

## 2021-10-31 ENCOUNTER — Other Ambulatory Visit (HOSPITAL_COMMUNITY)
Admission: RE | Admit: 2021-10-31 | Discharge: 2021-10-31 | Disposition: A | Discharge status: Home / Self Care | Payer: Commercial Managed Care - HMO | Source: Ambulatory Visit | Attending: Family Medicine | Admitting: Family Medicine

## 2021-10-31 ENCOUNTER — Other Ambulatory Visit: Payer: Self-pay

## 2021-10-31 VITALS — BP 131/89 | HR 85 | Wt 286.0 lb

## 2021-10-31 DIAGNOSIS — N760 Acute vaginitis: Secondary | ICD-10-CM | POA: Diagnosis not present

## 2021-10-31 DIAGNOSIS — N939 Abnormal uterine and vaginal bleeding, unspecified: Secondary | ICD-10-CM | POA: Diagnosis present

## 2021-10-31 DIAGNOSIS — Z3202 Encounter for pregnancy test, result negative: Secondary | ICD-10-CM

## 2021-10-31 LAB — POCT PREGNANCY, URINE: Preg Test, Ur: NEGATIVE

## 2021-10-31 NOTE — Progress Notes (Signed)
GYNECOLOGY OFFICE VISIT NOTE  History:   Regina Watkins is a 36 y.o. G1P0010 here today for vaginal bleeding x 4 weeks. Initially, bleeding was heavy/clots, Megace not helping, Provera helped, then stopped and took megace. Now light bleeding but not stopping despite medication use. She has chronically has heavy regular periods lasting up to 2 weeks, but they were successfully controlled on the megace. She desired pregnancy and therefore did not take provera, however, when megace did not work, she took provera and then her bleeding decreased, not stopped.  She is tired and not desiring medications to stop her periods. Admits cramping and + sex active. Last [place] 2021 for AUB was normal.   Past Medical History:  Diagnosis Date   Anemia    Back pain    BV (bacterial vaginosis)    Diabetes (HCC)    Ectopic pregnancy    Infection    UTI   No pertinent past medical history    Obesity    Prediabetes    Sleep apnea    Strep throat    Trichomonas     Past Surgical History:  Procedure Laterality Date   SALPINGECTOMY     removal of ectopic preg and tube   TUBAL LIGATION N/A    Phreesia 10/25/2019    The following portions of the patient's history were reviewed and updated as appropriate: allergies, current medications, past family history, past medical history, past social history, past surgical history and problem list.   Health Maintenance:  Normal pap and negative HRHPV on 06/13/21.  Normal mammogram on 08/09/21.   Review of Systems:  Pertinent items noted in HPI and remainder of comprehensive ROS otherwise negative.  Physical Exam:  BP 131/89   Pulse 85   Wt 286 lb (129.7 kg)   LMP 10/05/2021 (Approximate)   BMI 46.16 kg/m  CONSTITUTIONAL: Well-developed, well-nourished female in no acute distress.  HEENT:  Normocephalic, atraumatic. External right and left ear normal. No scleral icterus.  NECK: Normal range of motion, supple, no masses noted on observation SKIN: No rash  noted. Not diaphoretic. No erythema. No pallor. MUSCULOSKELETAL: Normal range of motion. No edema noted. NEUROLOGIC: Alert and oriented to person, place, and time. Normal muscle tone coordination.  PSYCHIATRIC: Normal mood and affect. Normal behavior. Normal judgment and thought content. CARDIOVASCULAR: Normal heart rate noted RESPIRATORY: Effort and breath sounds normal, no problems with respiration noted ABDOMEN: No masses noted. No other overt distention noted.   PELVIC: Normal appearing external genitalia; normal urethral meatus; normal appearing vaginal mucosa and cervix.  Mild blood tinged cervical discharge noted.  Normal uterine size, no other palpable masses, no uterine or adnexal tenderness. Performed in the presence of a chaperone  Labs and Imaging No results found for this or any previous visit (from the past 168 hour(s)). No results found.    Assessment and Plan:  1. Abnormal uterine bleeding (AUB) DDX metabolic issue vs UPT vs endometrial pathology. Change in regularity of bleeding is concerning and therefore EMB appropriate given her age and BMI. STI testing done today, labs and orders per below.  - Cervicovaginal ancillary only( [place]) - TSH + free T4 - Prolactin - US Transvaginal Non-OB; Future - Estrogens, Total - Progesterone   Routine preventative health maintenance measures emphasized. Please refer to After Visit Summary for other counseling recommendations.   Return in about 2 weeks (around 11/14/2021) for Emdometrial biopsy.    Myrtie Hawk, DO OB Fellow, Faculty Practice [facility] 10/31/2021 12:17 PM

## 2021-10-31 NOTE — Progress Notes (Signed)
Patient stated that she has been bleeding for about [redacted] weeks along with clots and pelvic cramps which prompted her to go to the ED 10/11/21  LMP: 10/05/21  Started taking Megace: years ago for irregular periods (as needed)  Birth control pills: 08/07-08/17  Patient is concerned she may have fibroids and/or PCOS due to her irregular periods and pelvic pain.   She does wish to get pregnant in the near future

## 2021-11-01 ENCOUNTER — Other Ambulatory Visit: Payer: Self-pay | Admitting: Family Medicine

## 2021-11-01 ENCOUNTER — Ambulatory Visit (INDEPENDENT_AMBULATORY_CARE_PROVIDER_SITE_OTHER): Payer: Commercial Managed Care - HMO | Admitting: Family

## 2021-11-01 VITALS — BP 108/75 | HR 78 | Temp 98.3°F | Resp 16 | Ht 66.0 in | Wt 284.0 lb

## 2021-11-01 DIAGNOSIS — B9689 Other specified bacterial agents as the cause of diseases classified elsewhere: Secondary | ICD-10-CM

## 2021-11-01 DIAGNOSIS — Z7689 Persons encountering health services in other specified circumstances: Secondary | ICD-10-CM

## 2021-11-01 LAB — CERVICOVAGINAL ANCILLARY ONLY
Bacterial Vaginitis (gardnerella): POSITIVE — AB
Candida Glabrata: NEGATIVE
Candida Vaginitis: NEGATIVE
Chlamydia: NEGATIVE
Comment: NEGATIVE
Comment: NEGATIVE
Comment: NEGATIVE
Comment: NEGATIVE
Comment: NEGATIVE
Comment: NORMAL
Neisseria Gonorrhea: NEGATIVE
Trichomonas: NEGATIVE

## 2021-11-01 LAB — PROLACTIN: Prolactin: 13.5 ng/mL (ref 4.8–23.3)

## 2021-11-01 LAB — TSH+FREE T4
Free T4: 1.2 ng/dL (ref 0.82–1.77)
TSH: 1.28 u[IU]/mL (ref 0.450–4.500)

## 2021-11-01 MED ORDER — METRONIDAZOLE 500 MG PO TABS: 500.0000 mg | ORAL_TABLET | Freq: Two times a day (BID) | ORAL | 0 refills | Status: AC

## 2021-11-01 NOTE — Progress Notes (Signed)
Pt presents for weight loss options -interested in weight loss injection  -goal weight 200lbs

## 2021-11-01 NOTE — Patient Instructions (Signed)
Mediterranean Diet ?A Mediterranean diet refers to food and lifestyle choices that are based on the traditions of countries located on the [place] Sea. It focuses on eating more fruits, vegetables, whole grains, beans, nuts, seeds, and heart-healthy fats, and eating less dairy, meat, eggs, and processed foods with added sugar, salt, and fat. This way of eating has been shown to help prevent certain conditions and improve outcomes for people who have chronic diseases, like kidney disease and heart disease. ?What are tips for following this plan? ?Reading food labels ?Check the serving size of packaged foods. For foods such as rice and pasta, the serving size refers to the amount of cooked product, not dry. ?Check the total fat in packaged foods. Avoid foods that have saturated fat or trans fats. ?Check the ingredient list for added sugars, such as corn syrup. ?Shopping ? ?Buy a variety of foods that offer a balanced diet, including: ?Fresh fruits and vegetables (produce). ?Grains, beans, nuts, and seeds. Some of these may be available in unpackaged forms or large amounts (in bulk). ?Fresh seafood. ?Poultry and eggs. ?Low-fat dairy products. ?Buy whole ingredients instead of prepackaged foods. ?Buy fresh fruits and vegetables in-season from local farmers markets. ?Buy plain frozen fruits and vegetables. ?If you do not have access to quality fresh seafood, buy precooked frozen shrimp or canned fish, such as tuna, salmon, or sardines. ?Stock your pantry so you always have certain foods on hand, such as olive oil, canned tuna, canned tomatoes, rice, pasta, and beans. ?Cooking ?Cook foods with extra-virgin olive oil instead of using butter or other vegetable oils. ?Have meat as a side dish, and have vegetables or grains as your main dish. This means having meat in small portions or adding small amounts of meat to foods like pasta or stew. ?Use beans or vegetables instead of meat in common dishes like chili or  lasagna. ?Experiment with different cooking methods. Try roasting, broiling, steaming, and saut?ing vegetables. ?Add frozen vegetables to soups, stews, pasta, or rice. ?Add nuts or seeds for added healthy fats and plant protein at each meal. You can add these to yogurt, salads, or vegetable dishes. ?Marinate fish or vegetables using olive oil, lemon juice, garlic, and fresh herbs. ?Meal planning ?Plan to eat one vegetarian meal one day each week. Try to work up to two vegetarian meals, if possible. ?Eat seafood two or more times a week. ?Have healthy snacks readily available, such as: ?Vegetable sticks with hummus. ?Greek yogurt. ?Fruit and nut trail mix. ?Eat balanced meals throughout the week. This includes: ?Fruit: 2-3 servings a day. ?Vegetables: 4-5 servings a day. ?Low-fat dairy: 2 servings a day. ?Fish, poultry, or lean meat: 1 serving a day. ?Beans and legumes: 2 or more servings a week. ?Nuts and seeds: 1-2 servings a day. ?Whole grains: 6-8 servings a day. ?Extra-virgin olive oil: 3-4 servings a day. ?Limit red meat and sweets to only a few servings a month. ?Lifestyle ? ?Cook and eat meals together with your family, when possible. ?Drink enough fluid to keep your urine pale yellow. ?Be physically active every day. This includes: ?Aerobic exercise like running or swimming. ?Leisure activities like gardening, walking, or housework. ?Get 7-8 hours of sleep each night. ?If recommended by your health care provider, drink red wine in moderation. This means 1 glass a day for nonpregnant women and 2 glasses a day for men. A glass of wine equals 5 oz (150 mL). ?What foods should I eat? ?Fruits ?Apples. Apricots. Avocado. Berries. Bananas. Cherries. Dates.   Figs. Grapes. Lemons. Melon. Oranges. Peaches. Plums. Pomegranate. ?Vegetables ?Artichokes. Beets. Broccoli. Cabbage. Carrots. Eggplant. Green beans. Chard. Kale. Spinach. Onions. Leeks. Peas. Squash. Tomatoes. Peppers. Radishes. ?Grains ?Whole-grain pasta. Brown  rice. Bulgur wheat. Polenta. Couscous. Whole-wheat bread. Oatmeal. Quinoa. ?Meats and other proteins ?Beans. Almonds. Sunflower seeds. Pine nuts. Peanuts. Cod. Salmon. Scallops. Shrimp. Tuna. Tilapia. Clams. Oysters. Eggs. Poultry without skin. ?Dairy ?Low-fat milk. Cheese. Greek yogurt. ?Fats and oils ?Extra-virgin olive oil. Avocado oil. Grapeseed oil. ?Beverages ?Water. Red wine. Herbal tea. ?Sweets and desserts ?Greek yogurt with honey. Baked apples. Poached pears. Trail mix. ?Seasonings and condiments ?Basil. Cilantro. Coriander. Cumin. Mint. Parsley. Sage. Rosemary. Tarragon. Garlic. Oregano. Thyme. Pepper. Balsamic vinegar. Tahini. Hummus. Tomato sauce. Olives. Mushrooms. ?The items listed above may not be a complete list of foods and beverages you can eat. Contact a dietitian for more information. ?What foods should I limit? ?This is a list of foods that should be eaten rarely or only on special occasions. ?Fruits ?Fruit canned in syrup. ?Vegetables ?Deep-fried potatoes (french fries). ?Grains ?Prepackaged pasta or rice dishes. Prepackaged cereal with added sugar. Prepackaged snacks with added sugar. ?Meats and other proteins ?Beef. Pork. Lamb. Poultry with skin. Hot dogs. Bacon. ?Dairy ?Ice cream. Sour cream. Whole milk. ?Fats and oils ?Butter. Canola oil. Vegetable oil. Beef fat (tallow). Lard. ?Beverages ?Juice. Sugar-sweetened soft drinks. Beer. Liquor and spirits. ?Sweets and desserts ?Cookies. Cakes. Pies. Candy. ?Seasonings and condiments ?Mayonnaise. Pre-made sauces and marinades. ?The items listed above may not be a complete list of foods and beverages you should limit. Contact a dietitian for more information. ?Summary ?The Mediterranean diet includes both food and lifestyle choices. ?Eat a variety of fresh fruits and vegetables, beans, nuts, seeds, and whole grains. ?Limit the amount of red meat and sweets that you eat. ?If recommended by your health care provider, drink red wine in moderation.  This means 1 glass a day for nonpregnant women and 2 glasses a day for men. A glass of wine equals 5 oz (150 mL). ?This information is not intended to replace advice given to you by your health care provider. Make sure you discuss any questions you have with your health care provider. ?Document Revised: 03/26/2019 Document Reviewed: 01/21/2019 ?Elsevier Patient Education ? 2023 Elsevier Inc. ? ?

## 2021-11-02 LAB — ESTROGENS, TOTAL: Estrogen: 66 pg/mL

## 2021-11-02 LAB — PROGESTERONE: Progesterone: <0.1 ng/mL

## 2021-11-12 ENCOUNTER — Ambulatory Visit
Admission: RE | Admit: 2021-11-12 | Discharge: 2021-11-12 | Disposition: A | Discharge status: Home / Self Care | Payer: Commercial Managed Care - HMO | Source: Ambulatory Visit | Attending: Family Medicine | Admitting: Family Medicine

## 2021-11-12 DIAGNOSIS — N939 Abnormal uterine and vaginal bleeding, unspecified: Secondary | ICD-10-CM | POA: Insufficient documentation

## 2021-11-14 ENCOUNTER — Telehealth: Payer: Self-pay | Admitting: Family Medicine

## 2021-11-14 NOTE — Telephone Encounter (Signed)
Patient called in wanting to discuss her test results with a nurse.

## 2021-11-15 NOTE — Telephone Encounter (Signed)
Call placed back to pt. Spoke with pt. Pt given results of all labs and imaging. Pt verbalized understanding. Pt also  states having slight odor and now itching since finished Rx Flagyl for BV. Pt advised can use OTC Monistat. Pt agreeable and will return call to office if has lasting symptoms or not resolved.  Deaundra Dupriest,RNC

## 2021-11-19 ENCOUNTER — Other Ambulatory Visit (HOSPITAL_COMMUNITY)
Admission: RE | Admit: 2021-11-19 | Discharge: 2021-11-19 | Disposition: A | Discharge status: Home / Self Care | Payer: Commercial Managed Care - HMO | Source: Ambulatory Visit | Attending: Obstetrics and Gynecology | Admitting: Obstetrics and Gynecology

## 2021-11-19 ENCOUNTER — Ambulatory Visit (INDEPENDENT_AMBULATORY_CARE_PROVIDER_SITE_OTHER): Payer: Commercial Managed Care - HMO | Admitting: Obstetrics and Gynecology

## 2021-11-19 ENCOUNTER — Encounter: Payer: Self-pay | Admitting: Obstetrics and Gynecology

## 2021-11-19 ENCOUNTER — Other Ambulatory Visit: Payer: Self-pay

## 2021-11-19 ENCOUNTER — Encounter: Payer: Self-pay | Admitting: Family

## 2021-11-19 DIAGNOSIS — N939 Abnormal uterine and vaginal bleeding, unspecified: Secondary | ICD-10-CM | POA: Insufficient documentation

## 2021-11-19 DIAGNOSIS — Z3202 Encounter for pregnancy test, result negative: Secondary | ICD-10-CM | POA: Diagnosis not present

## 2021-11-19 LAB — POCT PREGNANCY, URINE: Preg Test, Ur: NEGATIVE

## 2021-11-19 NOTE — Progress Notes (Signed)
Menarche age 36,  ENDOMETRIAL BIOPSY      Regina Watkins is a 36 y.o. G1P0010 here for endometrial biopsy.  The indications for endometrial biopsy were reviewed.  Risks of the biopsy including cramping, bleeding, infection, uterine perforation, inadequate specimen and need for additional procedures were discussed. The patient states she understands and agrees to undergo procedure today. Consent was signed. Time out was performed.   Indications: AUB Urine HCG: negative  A bivalve speculum was placed into the vagina and the cervix was easily visualized and was prepped with Betadine x2. A single-toothed tenaculum was placed on the anterior lip of the cervix to stabilize it. The 3 mm pipelle was introduced into the endometrial cavity without difficulty to a depth of 10 cm, and a moderate amount of tissue was obtained and sent to pathology. This was repeated for a total of 2 passes. The instruments were removed from the patient's vagina. Minimal bleeding from the cervix at the tenaculum was noted.   The patient tolerated the procedure well. Routine post-procedure instructions were given to the patient.    Will base further management on results of biopsy.  Briefly discussed PCOS with patient including future treatment including weight loss, possible GLP injectables +/- metformin.  Goal would be 200-220 lbs.  Pt will discuss with her PCP.  Hold further treatments until pt decides on how she will approach her weight loss.  Lynnda Shields, MD Faculty Attending, center for [facility]

## 2021-11-20 NOTE — Telephone Encounter (Signed)
Patient was referred to [facility]. Please provide patient with referral contact information listed below. For any further issues or concerns offer patient appointment with her PCP Dorna Mai, MD.  Referral contact information: [facility]  [address] [place] [address] Phone #: [phone]

## 2021-11-21 LAB — SURGICAL PATHOLOGY

## 2021-11-29 ENCOUNTER — Encounter: Payer: Commercial Managed Care - HMO | Admitting: Family Medicine

## 2021-12-11 ENCOUNTER — Other Ambulatory Visit: Payer: Self-pay | Admitting: Obstetrics and Gynecology

## 2021-12-11 DIAGNOSIS — N911 Secondary amenorrhea: Secondary | ICD-10-CM

## 2021-12-17 ENCOUNTER — Telehealth: Payer: Self-pay | Admitting: Family Medicine

## 2021-12-17 DIAGNOSIS — N939 Abnormal uterine and vaginal bleeding, unspecified: Secondary | ICD-10-CM

## 2021-12-17 NOTE — Telephone Encounter (Signed)
Patient said her pharmacy has sent over multiple requests for her  PROVERA.

## 2021-12-18 ENCOUNTER — Other Ambulatory Visit: Payer: Self-pay | Admitting: Obstetrics and Gynecology

## 2021-12-18 ENCOUNTER — Telehealth: Payer: Self-pay | Admitting: Family Medicine

## 2021-12-18 DIAGNOSIS — N911 Secondary amenorrhea: Secondary | ICD-10-CM

## 2021-12-18 DIAGNOSIS — N921 Excessive and frequent menstruation with irregular cycle: Secondary | ICD-10-CM

## 2021-12-18 MED ORDER — MEGESTROL ACETATE 40 MG PO TABS: 40.0000 mg | ORAL_TABLET | Freq: Every day | ORAL | 6 refills | Status: DC

## 2021-12-18 NOTE — Telephone Encounter (Signed)
Patient request a call back regarding her prescription that was supposed to be approved for the pharmacy.

## 2021-12-18 NOTE — Telephone Encounter (Signed)
Addressed in separate encounter.

## 2021-12-18 NOTE — Progress Notes (Signed)
Provera cancelled,  rx for megace given

## 2021-12-25 MED ORDER — MEDROXYPROGESTERONE ACETATE 10 MG PO TABS: 20.0000 mg | ORAL_TABLET | Freq: Every day | ORAL | 3 refills | Status: DC

## 2021-12-25 NOTE — Addendum Note (Signed)
Addended by: Annabell Howells on: 12/25/2021 08:25 AM   Modules accepted: Orders

## 2021-12-31 ENCOUNTER — Encounter: Payer: Self-pay | Admitting: Family Medicine

## 2021-12-31 ENCOUNTER — Ambulatory Visit (INDEPENDENT_AMBULATORY_CARE_PROVIDER_SITE_OTHER): Payer: Commercial Managed Care - HMO | Admitting: Family Medicine

## 2021-12-31 VITALS — BP 114/79 | HR 93 | Temp 98.1°F | Resp 16 | Ht 66.0 in | Wt 285.2 lb

## 2021-12-31 DIAGNOSIS — Z13220 Encounter for screening for lipoid disorders: Secondary | ICD-10-CM

## 2021-12-31 DIAGNOSIS — Z1329 Encounter for screening for other suspected endocrine disorder: Secondary | ICD-10-CM | POA: Diagnosis not present

## 2021-12-31 DIAGNOSIS — Z130 Encounter for screening for diseases of the blood and blood-forming organs and certain disorders involving the immune mechanism: Secondary | ICD-10-CM | POA: Diagnosis not present

## 2021-12-31 DIAGNOSIS — Z13228 Encounter for screening for other metabolic disorders: Secondary | ICD-10-CM

## 2021-12-31 DIAGNOSIS — Z0000 Encounter for general adult medical examination without abnormal findings: Secondary | ICD-10-CM | POA: Diagnosis not present

## 2022-01-01 ENCOUNTER — Encounter: Payer: Self-pay | Admitting: Family Medicine

## 2022-01-01 ENCOUNTER — Telehealth: Payer: Self-pay | Admitting: *Deleted

## 2022-01-01 LAB — LIPID PANEL
Chol/HDL Ratio: 3.7 ratio (ref 0.0–4.4)
Cholesterol, Total: 133 mg/dL (ref 100–199)
HDL: 36 mg/dL — ABNORMAL LOW (ref >39)
LDL Chol Calc (NIH): 69 mg/dL (ref 0–99)
Triglycerides: 164 mg/dL — ABNORMAL HIGH (ref 0–149)
VLDL Cholesterol Cal: 28 mg/dL (ref 5–40)

## 2022-01-01 LAB — CBC WITH DIFFERENTIAL/PLATELET
Basophils Absolute: 0 10*3/uL (ref 0.0–0.2)
Basos: 1 %
EOS (ABSOLUTE): 0.4 10*3/uL (ref 0.0–0.4)
Eos: 4 %
Hematocrit: 40.8 % (ref 34.0–46.6)
Hemoglobin: 13.2 g/dL (ref 11.1–15.9)
Immature Grans (Abs): 0 10*3/uL (ref 0.0–0.1)
Immature Granulocytes: 0 %
Lymphocytes Absolute: 2.2 10*3/uL (ref 0.7–3.1)
Lymphs: 27 %
MCH: 26.8 pg (ref 26.6–33.0)
MCHC: 32.4 g/dL (ref 31.5–35.7)
MCV: 83 fL (ref 79–97)
Monocytes Absolute: 0.4 10*3/uL (ref 0.1–0.9)
Monocytes: 4 %
Neutrophils Absolute: 5.1 10*3/uL (ref 1.4–7.0)
Neutrophils: 64 %
Platelets: 340 10*3/uL (ref 150–450)
RBC: 4.93 x10E6/uL (ref 3.77–5.28)
RDW: 13.2 % (ref 11.7–15.4)
WBC: 8 10*3/uL (ref 3.4–10.8)

## 2022-01-01 LAB — CMP14+EGFR
ALT: 20 IU/L (ref 0–32)
AST: 13 IU/L (ref 0–40)
Albumin/Globulin Ratio: 1.4 (ref 1.2–2.2)
Albumin: 4.1 g/dL (ref 3.9–4.9)
Alkaline Phosphatase: 81 IU/L (ref 44–121)
BUN/Creatinine Ratio: 11 (ref 9–23)
BUN: 10 mg/dL (ref 6–20)
Bilirubin Total: 0.9 mg/dL (ref 0.0–1.2)
CO2: 23 mmol/L (ref 20–29)
Calcium: 9.3 mg/dL (ref 8.7–10.2)
Chloride: 105 mmol/L (ref 96–106)
Creatinine, Ser: 0.9 mg/dL (ref 0.57–1.00)
Globulin, Total: 3 g/dL (ref 1.5–4.5)
Glucose: 243 mg/dL — ABNORMAL HIGH (ref 70–99)
Potassium: 4 mmol/L (ref 3.5–5.2)
Sodium: 141 mmol/L (ref 134–144)
Total Protein: 7.1 g/dL (ref 6.0–8.5)
eGFR: 85 mL/min/{1.73_m2} (ref >59)

## 2022-01-01 LAB — HEMOGLOBIN A1C
Est. average glucose Bld gHb Est-mCnc: 174 mg/dL
Hgb A1c MFr Bld: 7.7 % — ABNORMAL HIGH (ref 4.8–5.6)

## 2022-01-01 LAB — TSH: TSH: 0.773 u[IU]/mL (ref 0.450–4.500)

## 2022-01-01 NOTE — Telephone Encounter (Signed)
Call patient to make appt to go over lab results

## 2022-01-01 NOTE — Progress Notes (Signed)
Established Patient Office Visit  Subjective    Patient ID: Regina Watkins, female    DOB: 27-May-1985  Age: 36 y.o. MRN: [id number]  CC:  Chief Complaint  Patient presents with   Annual Exam    HPI Regina Watkins presents for routine annual physical Patient denies acute complaints or concerns.    Outpatient Encounter Medications as of 12/31/2021  Medication Sig   medroxyPROGESTERone (PROVERA) 10 MG tablet Take 2 tablets (20 mg total) by mouth daily.   megestrol (MEGACE) 40 MG tablet Take 1 tablet (40 mg total) by mouth daily.   No facility-administered encounter medications on file as of 12/31/2021.    Past Medical History:  Diagnosis Date   Anemia    Back pain    BV (bacterial vaginosis)    Diabetes ([place])    Ectopic pregnancy    Infection    UTI   No pertinent past medical history    Obesity    Prediabetes    Sleep apnea    Strep throat    Trichomonas     Past Surgical History:  Procedure Laterality Date   SALPINGECTOMY     removal of ectopic preg and tube   TUBAL LIGATION N/A    [employer]reesia 10/25/2019    Family History  Problem Relation Age of Onset   Heart disease Father        CHF   Hypertension Father    Sleep apnea Father    Diabetes Maternal Grandmother    Hypertension Maternal Grandmother    Cancer Maternal Grandmother        breast   Cancer Paternal Grandmother    Hypertension Mother    Thyroid disease Mother    Anesthesia problems Neg Hx    Hypotension Neg Hx    Malignant hyperthermia Neg Hx    Pseudochol deficiency Neg Hx     Social History   Socioeconomic History   Marital status: Single    Spouse name: Not on file   Number of children: Not on file   Years of education: Not on file   Highest education level: Not on file  Occupational History   Occupation: [employer]  Tobacco Use   Smoking status: Never   Smokeless tobacco: Never  Vaping Use   Vaping Use: Never used  Substance and Sexual Activity   Alcohol use: No    Drug use: No   Sexual activity: Yes    Birth control/protection: Condom    Comment: monogamous rlship wiht female partner of 8 years; last intercourse one month ago  Other Topics Concern   Not on file  Social History Narrative   Not on file   Social Determinants of Health   Financial Resource Strain: Not on file  Food Insecurity: No Food Insecurity (10/31/2021)   Hunger Vital Sign    Worried About Running Out of Food in the Last Year: Never true    Ran Out of Food in the Last Year: Never true  Transportation Needs: No Transportation Needs (10/31/2021)   PRAPARE - Hydrologist (Medical): No    Lack of Transportation (Non-Medical): No  Physical Activity: Not on file  Stress: Not on file  Social Connections: Not on file  Intimate Partner Violence: Not on file    Review of Systems  All other systems reviewed and are negative.       Objective    BP 114/79   Pulse 93   Temp 98.1 F (36.7  C) (Oral)   Resp 16   Ht _0  (1.676 m)   Wt 285 lb 3.2 oz (129.4 kg)   SpO2 95%   BMI 46.03 kg/m   Physical Exam Vitals and nursing note reviewed.  Constitutional:      General: She is not in acute distress.    Appearance: She is obese.  HENT:     Head: Normocephalic and atraumatic.     Right Ear: Tympanic membrane, ear canal and external ear normal.     Left Ear: Tympanic membrane, ear canal and external ear normal.     Nose: Nose normal.     Mouth/Throat:     Mouth: Mucous membranes are moist.     Pharynx: Oropharynx is clear.  Eyes:     Conjunctiva/sclera: Conjunctivae normal.     Pupils: Pupils are equal, round, and reactive to light.  Neck:     Thyroid: No thyromegaly.  Cardiovascular:     Rate and Rhythm: Normal rate and regular rhythm.     Heart sounds: Normal heart sounds. No murmur heard. Pulmonary:     Effort: Pulmonary effort is normal. No respiratory distress.     Breath sounds: Normal breath sounds.  Abdominal:     General: There  is no distension.     Palpations: Abdomen is soft. There is no mass.     Tenderness: There is no abdominal tenderness.  Musculoskeletal:        General: Normal range of motion.     Cervical back: Normal range of motion and neck supple.  Skin:    General: Skin is warm and dry.  Neurological:     General: No focal deficit present.     Mental Status: She is alert and oriented to person, place, and time.  Psychiatric:        Mood and Affect: Mood normal.        Behavior: Behavior normal.         Assessment & Plan:   1. Annual physical exam  - CMP14+EGFR  2. Screening for deficiency anemia  - CBC with Differential  3. Screening for endocrine/metabolic/immunity disorders  - TSH - Hemoglobin A1c  4. Screening for lipid disorders  - Lipid Panel    Return in about 1 year (around 01/01/2023) for physical.   Becky Sax, MD

## 2022-01-03 NOTE — Telephone Encounter (Signed)
Open in error

## 2022-01-04 ENCOUNTER — Ambulatory Visit (INDEPENDENT_AMBULATORY_CARE_PROVIDER_SITE_OTHER): Payer: Commercial Managed Care - HMO | Admitting: Family Medicine

## 2022-01-04 VITALS — BP 129/90 | HR 82 | Temp 98.1°F | Resp 16 | Wt 285.3 lb

## 2022-01-04 DIAGNOSIS — E1169 Type 2 diabetes mellitus with other specified complication: Secondary | ICD-10-CM

## 2022-01-04 MED ORDER — METFORMIN HCL ER 750 MG PO TB24
750.0000 mg | ORAL_TABLET | Freq: Every day | ORAL | 1 refills | Status: DC
Start: 2022-01-04 — End: 2022-02-06

## 2022-01-08 ENCOUNTER — Encounter: Payer: Self-pay | Admitting: Family Medicine

## 2022-01-08 NOTE — Progress Notes (Signed)
Established Patient Office Visit  Subjective    Patient ID: Regina Watkins, female    DOB: 1985-03-05  Age: 36 y.o. MRN: [id number]  CC: No chief complaint on file.   HPI [place] TRINETTA FREYTES presents to review labwork and for start of metformin. Patient denies acute complaints or concerns.    Outpatient Encounter Medications as of 01/04/2022  Medication Sig   medroxyPROGESTERone (PROVERA) 10 MG tablet Take 2 tablets (20 mg total) by mouth daily.   megestrol (MEGACE) 40 MG tablet Take 1 tablet (40 mg total) by mouth daily.   metFORMIN (GLUCOPHAGE-XR) 750 MG 24 hr tablet Take 1 tablet (750 mg total) by mouth daily with breakfast.   No facility-administered encounter medications on file as of 01/04/2022.    Past Medical History:  Diagnosis Date   Anemia    Back pain    BV (bacterial vaginosis)    Diabetes ([place])    Ectopic pregnancy    Infection    UTI   No pertinent past medical history    Obesity    Prediabetes    Sleep apnea    Strep throat    Trichomonas     Past Surgical History:  Procedure Laterality Date   SALPINGECTOMY     removal of ectopic preg and tube   TUBAL LIGATION N/A    Phreesia 10/25/2019    Family History  Problem Relation Age of Onset   Heart disease Father        CHF   Hypertension Father    Sleep apnea Father    Diabetes Maternal Grandmother    Hypertension Maternal Grandmother    Cancer Maternal Grandmother        breast   Cancer Paternal Grandmother    Hypertension Mother    Thyroid disease Mother    Anesthesia problems Neg Hx    Hypotension Neg Hx    Malignant hyperthermia Neg Hx    Pseudochol deficiency Neg Hx     Social History   Socioeconomic History   Marital status: Single    Spouse name: Not on file   Number of children: Not on file   Years of education: Not on file   Highest education level: Not on file  Occupational History   Occupation: [employer]  Tobacco Use   Smoking status: Never   Smokeless tobacco: Never   Vaping Use   Vaping Use: Never used  Substance and Sexual Activity   Alcohol use: No   Drug use: No   Sexual activity: Yes    Birth control/protection: Condom    Comment: monogamous rlship wiht female partner of 8 years; last intercourse one month ago  Other Topics Concern   Not on file  Social History Narrative   Not on file   Social Determinants of Health   Financial Resource Strain: Not on file  Food Insecurity: No Food Insecurity (10/31/2021)   Hunger Vital Sign    Worried About Running Out of Food in the Last Year: Never true    Ran Out of Food in the Last Year: Never true  Transportation Needs: No Transportation Needs (10/31/2021)   PRAPARE - Hydrologist (Medical): No    Lack of Transportation (Non-Medical): No  Physical Activity: Not on file  Stress: Not on file  Social Connections: Not on file  Intimate Partner Violence: Not on file    Review of Systems  All other systems reviewed and are negative.  Objective    BP (!) 129/90   Pulse 82   Temp 98.1 F (36.7 C) (Oral)   Resp 16   Wt 285 lb 4.8 oz (129.4 kg)   SpO2 97%   BMI 46.05 kg/m   Physical Exam Vitals and nursing note reviewed.  Constitutional:      General: She is not in acute distress. Cardiovascular:     Rate and Rhythm: Normal rate and regular rhythm.  Pulmonary:     Effort: Pulmonary effort is normal.     Breath sounds: Normal breath sounds.  Abdominal:     Palpations: Abdomen is soft.     Tenderness: There is no abdominal tenderness.  Neurological:     General: No focal deficit present.     Mental Status: She is alert and oriented to person, place, and time.         Assessment & Plan:   1. Type 2 diabetes mellitus with other specified complication, without long-term current use of insulin (HCC) Discussed dietary and activity options as well as compliance. Metformin prescribed. monitor    Return in about 3 months (around 04/06/2022) for follow  up.   Becky Sax, MD

## 2022-01-14 ENCOUNTER — Ambulatory Visit (INDEPENDENT_AMBULATORY_CARE_PROVIDER_SITE_OTHER): Payer: Self-pay | Admitting: Internal Medicine

## 2022-01-14 VITALS — BP 126/87 | HR 79 | Temp 98.1°F | Ht 66.0 in | Wt 277.0 lb

## 2022-01-14 DIAGNOSIS — Z0289 Encounter for other administrative examinations: Secondary | ICD-10-CM

## 2022-01-14 DIAGNOSIS — G4733 Obstructive sleep apnea (adult) (pediatric): Secondary | ICD-10-CM

## 2022-01-14 DIAGNOSIS — Z6841 Body Mass Index (BMI) 40.0 and over, adult: Secondary | ICD-10-CM

## 2022-01-14 DIAGNOSIS — Z7984 Long term (current) use of oral hypoglycemic drugs: Secondary | ICD-10-CM

## 2022-01-14 DIAGNOSIS — E1169 Type 2 diabetes mellitus with other specified complication: Secondary | ICD-10-CM

## 2022-01-14 DIAGNOSIS — E559 Vitamin D deficiency, unspecified: Secondary | ICD-10-CM

## 2022-01-14 NOTE — Progress Notes (Signed)
Office: [phone]  /  Fax: [phone]   Initial Visit  Regina Watkins was seen in clinic today to evaluate for obesity. She is interested in losing weight to improve overall health and reduce the risk of weight related complications. She presents today to review program treatment options, initial physical assessment, and evaluation.  She had been a former patient about 2 years ago but had dropped out of the program after a few visits due to inability to attend appointments.  She reports has been prescribed Wegovy by PCP but is unable to obtain medication.  She is currently on metformin for her type 2 diabetes.  She was referred by: PCP  When asked what else they would like to accomplish? She states: Adopt healthier eating patterns, Improve existing medical conditions, Reduce number of medications, Improve quality of life, and Improve appearance  When asked how has your weight affected you? She states: Contributed to medical problems, Having fatigue, Having poor endurance, and Problems with eating patterns  Some associated conditions: Hyperlipidemia, OSA, Diabetes, and Other: Vitamin D deficiency  Contributing factors: Family history, Nutritional, Reduced physical activity, and Eating patterns  Weight promoting medications identified: Contraceptives or hormonal therapy, she is on Provera but is only been taking it for 2 months.  Current nutrition plan: None  Current level of physical activity: Walking  Current or previous pharmacotherapy: Metformin  Response to medication: Other: Noticed improvement in appetite   Past medical history includes:   Past Medical History:  Diagnosis Date   Anemia    Back pain    BV (bacterial vaginosis)    Diabetes (HCC)    Ectopic pregnancy    Infection    UTI   No pertinent past medical history    Obesity    Prediabetes    Sleep apnea    Strep throat    Trichomonas      Objective:   BP 126/87   Pulse 79   Temp 98.1 F (36.7  C)   Ht 5\' 6"  (1.676 m)   Wt 277 lb (125.6 kg)   SpO2 98%   BMI 44.71 kg/m  She was weighed on the bioimpedance scale: Body mass index is 44.71 kg/m.  Peak Weight: 285,Visceral Fat Rating: 15, Body Fat%: 50, Weight trend over the last 12 months: Decreasing  General:  Alert, oriented and cooperative. Patient is in no acute distress.  Respiratory: Normal respiratory effort, no problems with respiration noted  Extremities: Normal range of motion.    Mental Status: Normal mood and affect. Normal behavior. Normal judgment and thought content.   Assessment and Plan:  1. Class 3 severe obesity with serious comorbidity and body mass index (BMI) of 40.0 to 44.9 in adult, unspecified obesity type (HCC) We reviewed weight, biometrics, associated medical conditions and contributing factors with patient. She would benefit from weight loss therapy via a modified calorie, low-carb, high-protein nutritional plan tailored to their REE (resting energy expenditure) which will be determined by indirect calorimetry.  We will also assess for cardiometabolic risk and nutritional derangements via fasting serologies at her next appointment.   2. OSA (obstructive sleep apnea) AHI 17, on CPAP.  Losing 15% of total body weight may improve condition  3. Type 2 diabetes mellitus with other specified complication, without long-term current use of insulin (HCC) Most recent A1c was 7.7.  We reviewed goals of care.  She is currently on metformin without any side effects.  She had been prescribed Wegovy by PCP but she states medication  is not available so has not started.  She has cut down on processed sugars but could do better.  Metformin could be increased for additional appetite suppression.  Weight loss therapy with a low glycemic index diet will assist with diabetes management  4. Vitamin D deficiency Her last vitamin D levels were 15.1 in 2021.  She reports receiving a few months duration and is currently not on  supplementation.  This is associated with adiposity and may contribute to leptin resistance and weight gain.  We will check vitamin D levels with her intake labs.       Obesity Treatment / Action Plan:  Patient will work on garnering support from family and friends to begin weight loss journey. Will work on eliminating or reducing the presence of highly palatable, calorie dense foods in the home. Will complete provided nutritional and psychosocial assessment questionnaire before the next appointment. Will be scheduled for indirect calorimetry to determine resting energy expenditure in a fasting state.  This will allow [place] to create a reduced calorie, high-protein meal plan to promote loss of fat mass while preserving muscle mass. Will think about ideas on how to incorporate physical activity into their daily routine. Will work on reducing intake of added sugars, simple sugars and processed carbs. Was counseled on nutritional approaches to weight loss and benefits of complex carbs and high quality protein as part of nutritional weight management.  Obesity Education Performed Today:  She was weighed on the bioimpedance scale and results were discussed and documented in the synopsis.  We discussed obesity as a disease and the importance of a more detailed evaluation of all the factors contributing to the disease.  We discussed the importance of long term lifestyle changes which include nutrition, exercise and behavioral modifications as well as the importance of customizing this to her specific health and social needs.  We discussed the benefits of reaching a healthier weight to alleviate the symptoms of existing conditions and reduce the risks of the biomechanical, metabolic and psychological effects of obesity.  Regina Watkins appears to be in the action stage of change and states they are ready to start intensive lifestyle modifications and behavioral modifications.  30 minutes was  spent today on this visit including the above counseling, pre-visit chart review, and post-visit documentation.  Reviewed by clinician on day of visit: allergies, medications, problem list, medical history, surgical history, family history, social history, and previous encounter notes.      I have reviewed the above documentation for accuracy and completeness, and I agree with the above.  Worthy Rancher, MD

## 2022-02-04 ENCOUNTER — Ambulatory Visit (INDEPENDENT_AMBULATORY_CARE_PROVIDER_SITE_OTHER): Payer: Commercial Managed Care - HMO | Admitting: Family Medicine

## 2022-02-04 VITALS — BP 119/78 | HR 76 | Temp 97.6°F | Resp 16 | Wt 282.0 lb

## 2022-02-04 DIAGNOSIS — E1169 Type 2 diabetes mellitus with other specified complication: Secondary | ICD-10-CM | POA: Diagnosis not present

## 2022-02-04 DIAGNOSIS — Z6841 Body Mass Index (BMI) 40.0 and over, adult: Secondary | ICD-10-CM | POA: Diagnosis not present

## 2022-02-04 DIAGNOSIS — Z7689 Persons encountering health services in other specified circumstances: Secondary | ICD-10-CM | POA: Diagnosis not present

## 2022-02-04 MED ORDER — WEGOVY 0.25 MG/0.5ML ~~LOC~~ SOAJ: 0.2500 mg | SUBCUTANEOUS | 0 refills | Status: DC

## 2022-02-04 NOTE — Progress Notes (Unsigned)
Patient is here for their 1 month follow-up Patient has no concerns today Care gaps have been discussed with patient  

## 2022-02-05 ENCOUNTER — Encounter: Payer: Self-pay | Admitting: Family Medicine

## 2022-02-05 NOTE — Progress Notes (Signed)
Established Patient Office Visit  Subjective    Patient ID: Regina Watkins, female    DOB: 03-14-1985  Age: 36 y.o. MRN: [id number]  CC:  Chief Complaint  Patient presents with   Follow-up   Diabetes    HPI [name] T Lichtenberger presents for routine weight management. Patient denies acute complaints or concerns.    Outpatient Encounter Medications as of 02/04/2022  Medication Sig   medroxyPROGESTERone (PROVERA) 10 MG tablet Take 2 tablets (20 mg total) by mouth daily.   megestrol (MEGACE) 40 MG tablet Take 1 tablet (40 mg total) by mouth daily.   metFORMIN (GLUCOPHAGE-XR) 750 MG 24 hr tablet Take 1 tablet (750 mg total) by mouth daily with breakfast.   Semaglutide-Weight Management (WEGOVY) 0.25 MG/0.5ML SOAJ Inject 0.25 mg into the skin every 7 (seven) days.   No facility-administered encounter medications on file as of 02/04/2022.    Past Medical History:  Diagnosis Date   Anemia    Back pain    BV (bacterial vaginosis)    Diabetes (HCC)    Ectopic pregnancy    Infection    UTI   No pertinent past medical history    Obesity    Prediabetes    Sleep apnea    Strep throat    Trichomonas     Past Surgical History:  Procedure Laterality Date   SALPINGECTOMY     removal of ectopic preg and tube   TUBAL LIGATION N/A    [employer] 10/25/2019    Family History  Problem Relation Age of Onset   Heart disease Father        CHF   Hypertension Father    Sleep apnea Father    Diabetes Maternal Grandmother    Hypertension Maternal Grandmother    Cancer Maternal Grandmother        breast   Cancer Paternal Grandmother    Hypertension Mother    Thyroid disease Mother    Anesthesia problems Neg Hx    Hypotension Neg Hx    Malignant hyperthermia Neg Hx    Pseudochol deficiency Neg Hx     Social History   Socioeconomic History   Marital status: Single    Spouse name: Not on file   Number of children: Not on file   Years of education: Not on file   Highest  education level: Not on file  Occupational History   Occupation: [employer]  Tobacco Use   Smoking status: Never   Smokeless tobacco: Never  Vaping Use   Vaping Use: Never used  Substance and Sexual Activity   Alcohol use: No   Drug use: No   Sexual activity: Yes    Birth control/protection: Condom    Comment: monogamous rlship wiht female partner of 8 years; last intercourse one month ago  Other Topics Concern   Not on file  Social History Narrative   Not on file   Social Determinants of Health   Financial Resource Strain: Not on file  Food Insecurity: No Food Insecurity (10/31/2021)   Hunger Vital Sign    Worried About Running Out of Food in the Last Year: Never true    Ran Out of Food in the Last Year: Never true  Transportation Needs: No Transportation Needs (10/31/2021)   PRAPARE - Administrator, Civil Service (Medical): No    Lack of Transportation (Non-Medical): No  Physical Activity: Not on file  Stress: Not on file  Social Connections: Not on file  Intimate Partner  Violence: Not on file    Review of Systems  All other systems reviewed and are negative.       Objective    BP 119/78   Pulse 76   Temp 97.6 F (36.4 C) (Oral)   Resp 16   Wt 282 lb (127.9 kg)   SpO2 97%   BMI 45.52 kg/m   Physical Exam Vitals and nursing note reviewed.  Constitutional:      General: She is not in acute distress. Cardiovascular:     Rate and Rhythm: Normal rate and regular rhythm.  Pulmonary:     Effort: Pulmonary effort is normal.     Breath sounds: Normal breath sounds.  Neurological:     General: No focal deficit present.     Mental Status: She is alert and oriented to person, place, and time.         Assessment & Plan:   1. Encounter for weight management Wegovy 0.25mg  prescribed. Patient goal is 3-5lbs/mo wt loss.   2. Class 3 severe obesity due to excess calories without serious comorbidity with body mass index (BMI) of 45.0 to 49.9 in adult  [facility]) As above  3. Type 2 diabetes mellitus with other specified complication, without long-term current use of insulin (HCC) Doing well with metfomrin. continue    Return in about 4 weeks (around 03/04/2022) for follow up.   Tommie Raymond, MD

## 2022-02-06 ENCOUNTER — Ambulatory Visit (INDEPENDENT_AMBULATORY_CARE_PROVIDER_SITE_OTHER): Payer: Commercial Managed Care - HMO | Admitting: Internal Medicine

## 2022-02-06 ENCOUNTER — Encounter (INDEPENDENT_AMBULATORY_CARE_PROVIDER_SITE_OTHER): Payer: Self-pay | Admitting: Internal Medicine

## 2022-02-06 VITALS — BP 129/86 | HR 99 | Temp 97.8°F | Ht 66.0 in | Wt 273.0 lb

## 2022-02-06 DIAGNOSIS — Z7985 Long-term (current) use of injectable non-insulin antidiabetic drugs: Secondary | ICD-10-CM

## 2022-02-06 DIAGNOSIS — E1169 Type 2 diabetes mellitus with other specified complication: Secondary | ICD-10-CM | POA: Diagnosis not present

## 2022-02-06 DIAGNOSIS — Z7984 Long term (current) use of oral hypoglycemic drugs: Secondary | ICD-10-CM

## 2022-02-06 DIAGNOSIS — Z6841 Body Mass Index (BMI) 40.0 and over, adult: Secondary | ICD-10-CM

## 2022-02-06 DIAGNOSIS — R0602 Shortness of breath: Secondary | ICD-10-CM | POA: Diagnosis not present

## 2022-02-06 DIAGNOSIS — R5383 Other fatigue: Secondary | ICD-10-CM

## 2022-02-06 DIAGNOSIS — Z1331 Encounter for screening for depression: Secondary | ICD-10-CM

## 2022-02-06 DIAGNOSIS — G4733 Obstructive sleep apnea (adult) (pediatric): Secondary | ICD-10-CM

## 2022-02-06 MED ORDER — METFORMIN HCL ER 750 MG PO TB24: 750.0000 mg | ORAL_TABLET | Freq: Two times a day (BID) | ORAL | 0 refills | Status: DC

## 2022-02-07 LAB — COMPREHENSIVE METABOLIC PANEL
ALT: 25 IU/L (ref 0–32)
AST: 21 IU/L (ref 0–40)
Albumin/Globulin Ratio: 1.8 (ref 1.2–2.2)
Albumin: 4.6 g/dL (ref 3.9–4.9)
Alkaline Phosphatase: 73 IU/L (ref 44–121)
BUN/Creatinine Ratio: 10 (ref 9–23)
BUN: 8 mg/dL (ref 6–20)
Bilirubin Total: 1 mg/dL (ref 0.0–1.2)
CO2: 24 mmol/L (ref 20–29)
Calcium: 9.3 mg/dL (ref 8.7–10.2)
Chloride: 104 mmol/L (ref 96–106)
Creatinine, Ser: 0.84 mg/dL (ref 0.57–1.00)
Globulin, Total: 2.6 g/dL (ref 1.5–4.5)
Glucose: 127 mg/dL — ABNORMAL HIGH (ref 70–99)
Potassium: 4 mmol/L (ref 3.5–5.2)
Sodium: 143 mmol/L (ref 134–144)
Total Protein: 7.2 g/dL (ref 6.0–8.5)
eGFR: 92 mL/min/{1.73_m2} (ref >59)

## 2022-02-07 LAB — VITAMIN B12: Vitamin B-12: 355 pg/mL (ref 232–1245)

## 2022-02-07 LAB — INSULIN, RANDOM: INSULIN: 35.5 u[IU]/mL — ABNORMAL HIGH (ref 2.6–24.9)

## 2022-02-07 LAB — VITAMIN D 25 HYDROXY (VIT D DEFICIENCY, FRACTURES): Vit D, 25-Hydroxy: 20 ng/mL — ABNORMAL LOW (ref 30.0–100.0)

## 2022-02-20 NOTE — Progress Notes (Signed)
Chief Complaint:   OBESITY Regina Watkins (MR# [id number]) is a 36 y.o. female who presents for evaluation and treatment of obesity and related comorbidities. Current BMI is Body mass index is 44.06 kg/m. [name] has been struggling with her weight for many years and has been unsuccessful in either losing weight, maintaining weight loss, or reaching her healthy weight goal.  [name] is currently in the action stage of change and ready to dedicate time achieving and maintaining a healthier weight. [name] is interested in becoming our patient and working on intensive lifestyle modifications including (but not limited to) diet and exercise for weight loss.  [name]'s habits were reviewed today and are as follows: Her family eats meals together, she thinks her family will eat healthier with her, her desired weight loss is 53-73 lbs, she has been heavy most of her life, her heaviest weight ever was 285 pounds, she has significant food cravings issues, she snacks frequently in the evenings, she skips meals frequently, she is frequently drinking liquids with calories, she frequently makes poor food choices, she frequently eats larger portions than normal, she has binge eating behaviors, and she struggles with emotional eating.  Depression Screen [name]'s Food and Mood (modified PHQ-9) score was 6.  Subjective:   1. Other fatigue [name] admits to daytime somnolence and admits to waking up still tired. Patient has a history of symptoms of daytime fatigue, morning fatigue, and morning headache. [name] generally gets 6 or 7 hours of sleep per night, and states that she has poor sleep quality. Snoring is present. Apneic episodes are not present. Epworth Sleepiness Score is 7.   2. SOB (shortness of breath) on exertion [name] notes increasing shortness of breath with exercising and seems to be worsening over time with weight gain. She notes getting out of breath sooner with activity than  she used to. This has gotten worse recently. [name] denies shortness of breath at rest or orthopnea.  3. OSA (obstructive sleep apnea) Patient has not received device (2 years waiting). Untreated.  4. Type 2 diabetes mellitus with other specified complication, without long-term current use of insulin (HCC) Discussed labs with patient today. A1c 7.7. Medications reviewed. Diabetic ROS: no polyuria or polydipsia, no chest pain, dyspnea or TIA's, no numbness, tingling or pain in extremities. BP close to goal. Pt is not on statin therapy with favorable lipid panel.  Assessment/Plan:   1. Other fatigue [name] does feel that her weight is causing her energy to be lower than it should be. Fatigue may be related to obesity, depression or many other causes. Labs will be ordered, and in the meanwhile, [name] will focus on self care including making healthy food choices, increasing physical activity and focusing on stress reduction.  - EKG 12-Lead  2. SOB (shortness of breath) on exertion [name] does feel that she gets out of breath more easily that she used to when she exercises. [name]'s shortness of breath appears to be obesity related and exercise induced. She has agreed to work on weight loss and gradually increase exercise to treat her exercise induced shortness of breath. Will continue to monitor closely.  3. OSA (obstructive sleep apnea) Advised pt to follow up with PCP and vendor. Weight loss of 10-15% will improve AHI.  4. Type 2 diabetes mellitus with other specified complication, without long-term current use of insulin (HCC) Increase Metformin to twice a day for diabetes mellitus and wight management. Would benefit from GLP-1, if it is not cost  prohibitive,   Lab/Orders today: - Vitamin B12 - Comprehensive metabolic panel - Insulin, random  Increase & Refill- metFORMIN (GLUCOPHAGE-XR) 750 MG 24 hr tablet; Take 1 tablet (750 mg total) by mouth 2 (two) times daily with a meal.   Dispense: 180 tablet; Refill: 0  5. Depression screen [name] had a positive depression screening. Depression is commonly associated with obesity and often results in emotional eating behaviors. We will monitor this closely and work on CBT to help improve the non-hunger eating patterns. Referral to Psychology may be required if no improvement is seen as she continues in our clinic.  6. Class 3 severe obesity with serious comorbidity and body mass index (BMI) of 40.0 to 44.9 in adult, unspecified obesity type (HCC) Lab/Orders today: - VITAMIN D 25 Hydroxy (Vit-D Deficiency, Fractures)  [name] is currently in the action stage of change and her goal is to continue with weight loss efforts. I recommend [name] begin the structured treatment plan as follows:  She has agreed to the Category 2 Plan with lunch options, protein snacks/smoothies.  Exercise goals:  As is and consider strength training.    Behavioral modification strategies: increasing lean protein intake, decreasing simple carbohydrates, increasing vegetables, increasing water intake, decreasing liquid calories, decreasing eating out, no skipping meals, meal planning and cooking strategies, keeping healthy foods in the home, better snacking choices, avoiding temptations, and planning for success.  She was informed of the importance of frequent follow-up visits to maximize her success with intensive lifestyle modifications for her multiple health conditions. She was informed we would discuss her lab results at her next visit unless there is a critical issue that needs to be addressed sooner. [name] agreed to keep her next visit at the agreed upon time to discuss these results.  Objective:   Blood pressure 129/86, pulse 99, temperature 97.8 F (36.6 C), height 5\' 6"  (1.676 m), weight 273 lb (123.8 kg), last menstrual period 01/02/2022, SpO2 95 %. Body mass index is 44.06 kg/m.  EKG: Normal sinus rhythm, rate 109-  tachycardia.  Indirect Calorimeter completed today shows a VO2 of 272 and a REE of 1872.  Her calculated basal metabolic rate is 13/03/2021 thus her basal metabolic rate is worse than expected.  General: Cooperative, alert, well developed, in no acute distress. HEENT: Conjunctivae and lids unremarkable. Cardiovascular: Regular rhythm.  Lungs: Normal work of breathing. Neurologic: No focal deficits.   Lab Results  Component Value Date   CREATININE 0.84 02/06/2022   BUN 8 02/06/2022   NA 143 02/06/2022   K 4.0 02/06/2022   CL 104 02/06/2022   CO2 24 02/06/2022   Lab Results  Component Value Date   ALT 25 02/06/2022   AST 21 02/06/2022   ALKPHOS 73 02/06/2022   BILITOT 1.0 02/06/2022   Lab Results  Component Value Date   HGBA1C 7.7 (H) 12/31/2021   HGBA1C 6.4 (H) 06/07/2020   HGBA1C 6.2 (H) 10/29/2019   HGBA1C 6.0 (A) 07/22/2019   HGBA1C 7.1 (H) 04/13/2019   Lab Results  Component Value Date   INSULIN 35.5 (H) 02/06/2022   INSULIN 31.5 (H) 11/16/2019   Lab Results  Component Value Date   TSH 0.773 12/31/2021   Lab Results  Component Value Date   CHOL 133 12/31/2021   HDL 36 (L) 12/31/2021   LDLCALC 69 12/31/2021   TRIG 164 (H) 12/31/2021   CHOLHDL 3.7 12/31/2021   Lab Results  Component Value Date   WBC 8.0 12/31/2021   HGB 13.2 12/31/2021  HCT 40.8 12/31/2021   MCV 83 12/31/2021   PLT 340 12/31/2021   Attestation Statements:   Reviewed by clinician on day of visit: allergies, medications, problem list, medical history, surgical history, family history, social history, and previous encounter notes.  Time spent on visit including pre-visit chart review and post-visit charting and care was 40 minutes.   I, Kyung Rudd, BS, CMA, am acting as transcriptionist for Worthy Rancher, MD.  I have reviewed the above documentation for accuracy and completeness, and I agree with the above. -Worthy Rancher, MD

## 2022-02-27 ENCOUNTER — Ambulatory Visit (INDEPENDENT_AMBULATORY_CARE_PROVIDER_SITE_OTHER): Payer: Commercial Managed Care - HMO | Admitting: Internal Medicine

## 2022-02-27 ENCOUNTER — Encounter (INDEPENDENT_AMBULATORY_CARE_PROVIDER_SITE_OTHER): Payer: Self-pay | Admitting: Internal Medicine

## 2022-02-27 VITALS — BP 130/76 | HR 101 | Temp 98.3°F | Ht 66.0 in | Wt 276.2 lb

## 2022-02-27 DIAGNOSIS — E1169 Type 2 diabetes mellitus with other specified complication: Secondary | ICD-10-CM

## 2022-02-27 DIAGNOSIS — Z6841 Body Mass Index (BMI) 40.0 and over, adult: Secondary | ICD-10-CM

## 2022-02-27 DIAGNOSIS — E559 Vitamin D deficiency, unspecified: Secondary | ICD-10-CM | POA: Diagnosis not present

## 2022-02-27 DIAGNOSIS — Z7985 Long-term (current) use of injectable non-insulin antidiabetic drugs: Secondary | ICD-10-CM

## 2022-02-27 DIAGNOSIS — E669 Obesity, unspecified: Secondary | ICD-10-CM | POA: Diagnosis not present

## 2022-02-27 MED ORDER — OZEMPIC (0.25 OR 0.5 MG/DOSE) 2 MG/1.5ML ~~LOC~~ SOPN: 0.2500 mg | PEN_INJECTOR | SUBCUTANEOUS | 0 refills | Status: DC

## 2022-02-27 MED ORDER — CHOLECALCIFEROL 1.25 MG (50000 UT) PO TABS: 50000.0000 [IU] | ORAL_TABLET | ORAL | 0 refills | Status: DC

## 2022-02-27 NOTE — Progress Notes (Signed)
Interim history: Patient presents today for short interval follow-up following initiation of meal plan.  She did meal plan for about a week and felt she was doing well but had a lapse during the holidays.  She felt the plan easy and satisfying.  She has reduced sweets, sugary drinks and has replaced some processed foods with fruits.  We had increased her metformin for her diabetes and also incretin effect and she denies any side effects.  Her doctor had prescribed Wegovy but according to patient medication was out of stock.  She does note cravings for sweets and increased hunger signals in between meals been following the plan.  Plan: Patient would benefit from pharmacotherapy to improve glycemic control and assist with weight loss efforts.  After discussion of benefits, risks, common side effects, shared decision making and demonstration of use of device she will be started on Ozempic 0.25 mg subcu once a week for her diabetes and weight management.

## 2022-02-28 DIAGNOSIS — E559 Vitamin D deficiency, unspecified: Secondary | ICD-10-CM | POA: Insufficient documentation

## 2022-02-28 LAB — MICROALBUMIN / CREATININE URINE RATIO
Creatinine, Urine: 128 mg/dL
Microalb/Creat Ratio: 18 mg/g creat (ref 0–29)
Microalbumin, Urine: 22.9 ug/mL

## 2022-03-06 ENCOUNTER — Encounter (INDEPENDENT_AMBULATORY_CARE_PROVIDER_SITE_OTHER): Payer: Self-pay

## 2022-03-06 ENCOUNTER — Telehealth (INDEPENDENT_AMBULATORY_CARE_PROVIDER_SITE_OTHER): Payer: Self-pay | Admitting: Internal Medicine

## 2022-03-06 NOTE — Telephone Encounter (Signed)
Pt called 03/06/22 to see if we have received a fax from her pharmacy for authorization on [place].The Pharmacy ran without authorization it was 1000.00 dollars

## 2022-03-07 ENCOUNTER — Telehealth (INDEPENDENT_AMBULATORY_CARE_PROVIDER_SITE_OTHER): Payer: Self-pay | Admitting: Internal Medicine

## 2022-03-07 NOTE — Telephone Encounter (Signed)
Patient insurance company called to get a PA sent to [email or web address]. For ozempic

## 2022-03-11 NOTE — Telephone Encounter (Signed)
1/8 Patient stated that pharamacy fax over for approved for vitamin d to be put in capsule for because the dont have tablet form. Ardeen Fillers

## 2022-03-13 NOTE — Telephone Encounter (Signed)
It was faxed to Pharmacy  03/11/2022

## 2022-03-16 NOTE — Progress Notes (Unsigned)
Chief Complaint:   OBESITY Regina Watkins is here to discuss her progress with her obesity treatment plan along with follow-up of her obesity related diagnoses. Regina Watkins is on the Category 2 Plan with lunch options and states she is following her eating plan approximately 30% of the time. Regina Watkins states she is walking and doing home exercises for 40 minutes 4 times per week.  Today's visit was #: 2 Starting weight: 273 lbs Starting date: 02/06/2022 Today's weight: 276 lbs Today's date: 02/27/2022 Total lbs lost to date: 0 Total lbs lost since last in-office visit: 0  Interim History: Patient presents today for short interval follow-up following initiation of meal plan.  She did meal plan for about a week and felt she was doing well but had a lapse during the holidays.  She felt the plan easy and satisfying.  She has reduced sweets, sugary drinks and has replaced some processed foods with fruits.  We had increased her metformin for her diabetes and also incretin effect and she denies any side effects.  Her doctor had prescribed Wegovy but according to patient medication was out of stock.  She does note cravings for sweets and increased hunger signals in between meals been following the plan.  Subjective:   1. Type 2 diabetes mellitus with other specified complication, without long-term current use of insulin (HCC) Recent A1c was 7.7. Counseled on the disease state and preventative measures. Blood pressure <130/80. I discussed labs with the patient today.   2. Vitamin D deficiency Recent Vitamin D level was 20, goal 50-60. Associated with adiposity and leptin resistance.   Assessment/Plan:   1. Type 2 diabetes mellitus with other specified complication, without long-term current use of insulin ([place]) Checked VMA today. Schedule eye exam as advised by her PCP. Ruberta will continue metformin and start Ozempic 0.25 mg once weekly with no refills. Demonstrated the use of the pen device for the  patient. She also needs a foot exam and glucose meter. She will discuss with PCP for continuity.   - Semaglutide,0.25 or 0.5MG /DOS, (OZEMPIC, 0.25 OR 0.5 MG/DOSE,) 2 MG/1.5ML SOPN; Inject 0.25 mg into the skin once a week.  Dispense: 1.5 mL; Refill: 0 - Urine Microalbumin w/creat. ratio  2. Vitamin D deficiency Regina Watkins agreed to start prescription Vitamin D 50,000 IU once weekly with no refills.   - Cholecalciferol 1.25 MG (50000 UT) TABS; Take 50,000 Units by mouth once a week.  Dispense: 4 tablet; Refill: 0  3. Obesity with current BMI of 44.6 Patient would benefit from pharmacotherapy to improve glycemic control and assist with weight loss efforts.  After discussion of benefits, risks, common side effects, shared decision making and demonstration of use of device she will be started on Ozempic 0.25 mg subcu once a week for her diabetes and weight management.  Lynise is currently in the action stage of change. As such, her goal is to continue with weight loss efforts. She has agreed to the Category 2 Plan.   Exercise goals: As is.   Behavioral modification strategies: increasing lean protein intake, decreasing simple carbohydrates, increasing water intake, no skipping meals, meal planning and cooking strategies, better snacking choices, avoiding temptations, and planning for success.  Shandelle has agreed to follow-up with our clinic in 2 to 3 weeks. She was informed of the importance of frequent follow-up visits to maximize her success with intensive lifestyle modifications for her multiple health conditions.   Objective:   Blood pressure 130/76, pulse (!) 101, temperature 98.3  F (36.8 C), height 5\' 6"  (1.676 m), weight 276 lb 3.2 oz (125.3 kg), last menstrual period 01/02/2022, SpO2 95 %. Body mass index is 44.58 kg/m.  General: Cooperative, alert, well developed, in no acute distress. HEENT: Conjunctivae and lids unremarkable. Cardiovascular: Regular rhythm.  Lungs: Normal work of  breathing. Neurologic: No focal deficits.   Lab Results  Component Value Date   CREATININE 0.84 02/06/2022   BUN 8 02/06/2022   NA 143 02/06/2022   K 4.0 02/06/2022   CL 104 02/06/2022   CO2 24 02/06/2022   Lab Results  Component Value Date   ALT 25 02/06/2022   AST 21 02/06/2022   ALKPHOS 73 02/06/2022   BILITOT 1.0 02/06/2022   Lab Results  Component Value Date   HGBA1C 7.7 (H) 12/31/2021   HGBA1C 6.4 (H) 06/07/2020   HGBA1C 6.2 (H) 10/29/2019   HGBA1C 6.0 (A) 07/22/2019   HGBA1C 7.1 (H) 04/13/2019   Lab Results  Component Value Date   INSULIN 35.5 (H) 02/06/2022   INSULIN 31.5 (H) 11/16/2019   Lab Results  Component Value Date   TSH 0.773 12/31/2021   Lab Results  Component Value Date   CHOL 133 12/31/2021   HDL 36 (L) 12/31/2021   LDLCALC 69 12/31/2021   TRIG 164 (H) 12/31/2021   CHOLHDL 3.7 12/31/2021   Lab Results  Component Value Date   VD25OH 20.0 (L) 02/06/2022   VD25OH 15.1 (L) 06/07/2020   VD25OH 18.3 (L) 11/16/2019   Lab Results  Component Value Date   WBC 8.0 12/31/2021   HGB 13.2 12/31/2021   HCT 40.8 12/31/2021   MCV 83 12/31/2021   PLT 340 12/31/2021   No results found for: "IRON", "TIBC", "FERRITIN"  Attestation Statements:   Reviewed by clinician on day of visit: allergies, medications, problem list, medical history, surgical history, family history, social history, and previous encounter notes.   Wilhemena Durie, am acting as transcriptionist for Thomes Dinning, MD.  I have reviewed the above documentation for accuracy and completeness, and I agree with the above. -Thomes Dinning, MD

## 2022-03-18 ENCOUNTER — Telehealth (INDEPENDENT_AMBULATORY_CARE_PROVIDER_SITE_OTHER): Payer: Self-pay

## 2022-03-18 NOTE — Telephone Encounter (Signed)
PA done

## 2022-03-19 ENCOUNTER — Encounter (INDEPENDENT_AMBULATORY_CARE_PROVIDER_SITE_OTHER): Payer: Self-pay | Admitting: Internal Medicine

## 2022-03-19 ENCOUNTER — Ambulatory Visit (INDEPENDENT_AMBULATORY_CARE_PROVIDER_SITE_OTHER): Payer: Commercial Managed Care - HMO | Admitting: Internal Medicine

## 2022-03-19 VITALS — BP 122/77 | HR 85 | Temp 98.5°F | Ht 66.0 in | Wt 277.0 lb

## 2022-03-19 DIAGNOSIS — E669 Obesity, unspecified: Secondary | ICD-10-CM | POA: Diagnosis not present

## 2022-03-19 DIAGNOSIS — E1169 Type 2 diabetes mellitus with other specified complication: Secondary | ICD-10-CM

## 2022-03-19 DIAGNOSIS — R030 Elevated blood-pressure reading, without diagnosis of hypertension: Secondary | ICD-10-CM | POA: Insufficient documentation

## 2022-03-19 DIAGNOSIS — Z7984 Long term (current) use of oral hypoglycemic drugs: Secondary | ICD-10-CM

## 2022-03-19 DIAGNOSIS — Z6841 Body Mass Index (BMI) 40.0 and over, adult: Secondary | ICD-10-CM | POA: Diagnosis not present

## 2022-03-19 NOTE — Progress Notes (Signed)
Office: [phone]  /  Fax: [phone]  HPI  Chief Complaint: OBESITY  Regina Watkins is here to discuss her progress with her obesity treatment plan. She is on the the Category 2 Plan and states she is following her eating plan approximately 80 % of the time. She states she is exercising, doing cardio and strength for 60 minutes 3/7 times per week.  Today's visit was # 2 Starting weight: 273 b Starting date: 02/06/2022 Today's weight : 277 lbs  Today's date: 03/19/2022 Total lbs lost to date: 0 lbs  Total lbs lost since last in-office visit: 0 lbs  Interval History: Since last office she reports good adherence to prescribed reduced calorie nutritional plan.  Reports problems with appetite and hunger signals. Reports problems with satiety and satiation. Denies problems with eating patterns and portion control. Sleeping approximately 5 hours a day. Sleep described as interrupted. Stress levels are reported as high and due to due to child with special needs . Barriers identified lack of time for self-care and cost of medication.  Pharmacotherapy: Had been prescribed Ozempic but this is in the review process with her insurance.  Prior authorization has been submitted by our medical assistant.  In regards to medications that may cause weight gain she is taking Megace 3 times a day for management of menstrual flow.  This medication may increase appetite and make calorie reduction difficult.  She also is on Provera.  PHYSICAL EXAM:  Blood pressure 122/77, pulse 85, temperature 98.5 F (36.9 C), height 5\' 6"  (1.676 m), weight 277 lb (125.6 kg), SpO2 98 %. Body mass index is 44.71 kg/m.  General: She is overweight, cooperative, alert, well developed, and in no acute distress. PSYCH: Has normal mood, affect and thought process.   HEENT: EOMI, sclerae are anicteric. Lungs: Normal breathing effort, no conversational dyspnea. Extremities: No edema.  Neurologic: No gross sensory or motor  deficits. No tremors or fasciculations noted.    DIAGNOSTIC DATA REVIEWED:  BMET    Component Value Date/Time   NA 143 02/06/2022 1116   K 4.0 02/06/2022 1116   CL 104 02/06/2022 1116   CO2 24 02/06/2022 1116   GLUCOSE 127 (H) 02/06/2022 1116   GLUCOSE 162 (H) 10/11/2021 2150   BUN 8 02/06/2022 1116   CREATININE 0.84 02/06/2022 1116   CALCIUM 9.3 02/06/2022 1116   GFRNONAA >60 10/11/2021 2150   GFRAA 110 10/29/2019 0908   Lab Results  Component Value Date   HGBA1C 7.7 (H) 12/31/2021   HGBA1C 7.1 (H) 04/13/2019   Lab Results  Component Value Date   INSULIN 35.5 (H) 02/06/2022   INSULIN 31.5 (H) 11/16/2019   CBC    Component Value Date/Time   WBC 8.0 12/31/2021 1504   WBC 8.6 10/11/2021 2150   RBC 4.93 12/31/2021 1504   RBC 4.68 10/11/2021 2150   HGB 13.2 12/31/2021 1504   HCT 40.8 12/31/2021 1504   PLT 340 12/31/2021 1504   MCV 83 12/31/2021 1504   MCH 26.8 12/31/2021 1504   MCH 27.4 10/11/2021 2150   MCHC 32.4 12/31/2021 1504   MCHC 33.4 10/11/2021 2150   RDW 13.2 12/31/2021 1504   Iron/TIBC/Ferritin/ %Sat No results found for: "IRON", "TIBC", "FERRITIN", "IRONPCTSAT" Lipid Panel     Component Value Date/Time   CHOL 133 12/31/2021 1504   TRIG 164 (H) 12/31/2021 1504   HDL 36 (L) 12/31/2021 1504   CHOLHDL 3.7 12/31/2021 1504   LDLCALC 69 12/31/2021 1504   Hepatic Function Panel  Component Value Date/Time   PROT 7.2 02/06/2022 1116   ALBUMIN 4.6 02/06/2022 1116   AST 21 02/06/2022 1116   ALT 25 02/06/2022 1116   ALKPHOS 73 02/06/2022 1116   BILITOT 1.0 02/06/2022 1116      Component Value Date/Time   TSH 0.773 12/31/2021 1504     ASSESSMENT AND PLAN    Obesity with current BMI of 44.6  TREATMENT PLAN FOR OBESITY:  Recommended Dietary Goals  Harmoney is currently in the action stage of change. As such, her goal is to continue with weight loss efforts. She has agreed to the Category 2 Plan.  Behavioral Intervention  We discussed the  following Behavioral Modification Strategies today: increasing lean protein intake, increasing water intake, no skipping meals, meal planning and cooking strategies, keeping healthy foods in the home, avoiding temptations, and planning for success.  Resources provided today: Management of stress and sleep  Recommended Physical Activity Goals  Sharmon has been instructed to work up to 150 minutes of moderate intensity aerobic activity a week and strengthening exercises 2-3 times per week for cardiovascular health, weight loss maintenance and preservation of muscle mass.   She has agreed to continue current activity level.  Pharmacotherapy She will work with prescribing physicians to look at other alternatives that are more weight neutral for the management of menstrual bleeding.  .  We will await decision from insurance company in regards to coverage for GLP-1 treatment which I feel would be of benefit to patient who is affected by obesity and also type 2 diabetes.  This will aid her in her efforts in losing weight and improving overall health.  ASSOCIATED CONDITIONS ADDRESSED TODAY  1. Type 2 diabetes mellitus with other specified complication, without long-term current use of insulin (HCC) Last hemoglobin A1c was 7.7.  She is currently on metformin twice a day.  Were awaiting approval for incretin therapy we had recommended Ozempic but she may have to go with a different brand if approved.  She will continue to work on weight loss therapy and will work with primary care team to update diabetes preventive measures.  We recently checked UMA which was normal.  Her blood pressure today repeated was at goal.   2. Elevated blood pressure reading without diagnosis of hypertension Repeat blood pressure today was 126/76.  I recommend the patient monitor her blood pressure at home particularly in the morning and before bedtime.  She is under significant stress as she is caring for her child with  intellectual disability.  We will continue to monitor.  Oviya has agreed to follow-up with our clinic in 3 weeks. She was informed of the importance of frequent follow-up visits to maximize her success with intensive lifestyle modifications for her multiple health conditions.   Thomes Dinning, MD

## 2022-03-25 NOTE — Telephone Encounter (Signed)
PA denied.

## 2022-04-05 ENCOUNTER — Ambulatory Visit: Payer: Commercial Managed Care - HMO | Admitting: Family Medicine

## 2022-04-11 ENCOUNTER — Ambulatory Visit (INDEPENDENT_AMBULATORY_CARE_PROVIDER_SITE_OTHER): Payer: Commercial Managed Care - HMO | Admitting: Internal Medicine

## 2022-04-11 ENCOUNTER — Encounter (INDEPENDENT_AMBULATORY_CARE_PROVIDER_SITE_OTHER): Payer: Self-pay | Admitting: Internal Medicine

## 2022-04-11 VITALS — BP 126/84 | HR 81 | Temp 98.4°F | Ht 66.0 in | Wt 273.0 lb

## 2022-04-11 DIAGNOSIS — Z6841 Body Mass Index (BMI) 40.0 and over, adult: Secondary | ICD-10-CM | POA: Diagnosis not present

## 2022-04-11 DIAGNOSIS — R030 Elevated blood-pressure reading, without diagnosis of hypertension: Secondary | ICD-10-CM | POA: Diagnosis not present

## 2022-04-11 DIAGNOSIS — E1169 Type 2 diabetes mellitus with other specified complication: Secondary | ICD-10-CM | POA: Diagnosis not present

## 2022-04-11 DIAGNOSIS — E669 Obesity, unspecified: Secondary | ICD-10-CM

## 2022-04-11 DIAGNOSIS — Z7984 Long term (current) use of oral hypoglycemic drugs: Secondary | ICD-10-CM

## 2022-04-11 NOTE — Assessment & Plan Note (Signed)
HgbA1c is not at goal for age and comorbid conditions. Denies symptoms of hypoglycemia or hyperglycemia. On metformin twice a day with good adherence and no side effects.  Ozempic was denied by her insurance.  Counseled on goals of care, monitoring for complications and importance of staying updated on immunizations and diabetes preventive measures.  She has an appointment with her primary care team in the near future.  She will continue continue with reduced calorie meal plan low on processed crabs and simple sugars. Ongoing weight loss will improve insulin resistance and glycemic control  Lab Results  Component Value Date   HGBA1C 7.7 (H) 12/31/2021   HGBA1C 6.4 (H) 06/07/2020   HGBA1C 6.2 (H) 10/29/2019   Lab Results  Component Value Date   LDLCALC 69 12/31/2021   CREATININE 0.84 02/06/2022   We will consider starting Victoza at the next office visit.

## 2022-04-11 NOTE — Addendum Note (Signed)
Addended by: Glendale Chard on: 04/11/2022 05:30 PM   Modules accepted: Level of Service

## 2022-04-11 NOTE — Progress Notes (Signed)
Office: [phone]  /  Fax: [phone]  WEIGHT SUMMARY AND BIOMETRICS  Medical Weight Loss Height: 5\' 6"  (1.676 m) Weight: 273 lb (123.8 kg) Temp: 98.4 F (36.9 C) Pulse Rate: 81 BP: 126/84 SpO2: 98 % Fasting: n Labs: n Today's Visit #: 4 Weight at Last VIsit: 277 lb Weight Lost Since Last Visit: 4 lb  Body Fat %: 51.5 % Fat Mass (lbs): 141 lbs Muscle Mass (lbs): 126 lbs Total Body Water (lbs): 96 lbs Visceral Fat Rating : 15 Starting Date: 02/06/22 Starting Weight: 212 lb Total Weight Loss (lbs): 4 lb (1.814 kg)    HPI  Chief Complaint: OBESITY  Regina Watkins is here to discuss her progress with her obesity treatment plan. She is on the Category 2 Plan and states she is following her eating plan approximately 60-70 % of the time. She states she is exercising 40 minutes 4 times per week.   Interval History:  Since last office visit she has lost 4 pounds.  She had been prescribed Ozempic for her diabetes which her insurance denied.  They are requesting that she try Victoza first.  Her 24-hour recall: For breakfast she is having boiled eggs cheese and toast, for lunch she is having a [place] sandwich or salad with chicken.  She is drinking seltzer water and 0 sugar soda.  She is no longer taking Megace which was for control of menstrual flow.   Pharmacotherapy: None  PHYSICAL EXAM:  Blood pressure 126/84, pulse 81, temperature 98.4 F (36.9 C), height 5\' 6"  (1.676 m), weight 273 lb (123.8 kg), SpO2 98 %. Body mass index is 44.06 kg/m.  General: She is overweight, cooperative, alert, well developed, and in no acute distress. PSYCH: Has normal mood, affect and thought process.   HEENT: EOMI, sclerae are anicteric. Lungs: Normal breathing effort, no conversational dyspnea. Extremities: No edema.  Neurologic: No gross sensory or motor deficits. No tremors or fasciculations noted.    DIAGNOSTIC DATA REVIEWED:  BMET    Component Value Date/Time   NA 143 02/06/2022  1116   K 4.0 02/06/2022 1116   CL 104 02/06/2022 1116   CO2 24 02/06/2022 1116   GLUCOSE 127 (H) 02/06/2022 1116   GLUCOSE 162 (H) 10/11/2021 2150   BUN 8 02/06/2022 1116   CREATININE 0.84 02/06/2022 1116   CALCIUM 9.3 02/06/2022 1116   GFRNONAA >60 10/11/2021 2150   GFRAA 110 10/29/2019 0908   Lab Results  Component Value Date   HGBA1C 7.7 (H) 12/31/2021   HGBA1C 7.1 (H) 04/13/2019   Lab Results  Component Value Date   INSULIN 35.5 (H) 02/06/2022   INSULIN 31.5 (H) 11/16/2019   Lab Results  Component Value Date   TSH 0.773 12/31/2021   CBC    Component Value Date/Time   WBC 8.0 12/31/2021 1504   WBC 8.6 10/11/2021 2150   RBC 4.93 12/31/2021 1504   RBC 4.68 10/11/2021 2150   HGB 13.2 12/31/2021 1504   HCT 40.8 12/31/2021 1504   PLT 340 12/31/2021 1504   MCV 83 12/31/2021 1504   MCH 26.8 12/31/2021 1504   MCH 27.4 10/11/2021 2150   MCHC 32.4 12/31/2021 1504   MCHC 33.4 10/11/2021 2150   RDW 13.2 12/31/2021 1504   Iron Studies No results found for: "IRON", "TIBC", "FERRITIN", "IRONPCTSAT" Lipid Panel     Component Value Date/Time   CHOL 133 12/31/2021 1504   TRIG 164 (H) 12/31/2021 1504   HDL 36 (L) 12/31/2021 1504   CHOLHDL 3.7 12/31/2021  1504   LDLCALC 69 12/31/2021 1504   Hepatic Function Panel     Component Value Date/Time   PROT 7.2 02/06/2022 1116   ALBUMIN 4.6 02/06/2022 1116   AST 21 02/06/2022 1116   ALT 25 02/06/2022 1116   ALKPHOS 73 02/06/2022 1116   BILITOT 1.0 02/06/2022 1116      Component Value Date/Time   TSH 0.773 12/31/2021 1504   Nutritional Lab Results  Component Value Date   VD25OH 20.0 (L) 02/06/2022   VD25OH 15.1 (L) 06/07/2020   VD25OH 18.3 (L) 11/16/2019     ASSESSMENT AND PLAN  TREATMENT PLAN FOR OBESITY:  Recommended Dietary Goals  Regina Watkins is currently in the action stage of change. As such, her goal is to continue weight management plan. She has agreed to the Category 2 Plan.  Behavioral  Intervention  We discussed the following Behavioral Modification Strategies today: increasing lean protein intake, decreasing simple carbohydrates , increasing vegetables, increase water intake, work on meal planning and easy cooking plans, and think about ways to increase physical activity.  Additional resources provided today: NA  Recommended Physical Activity Goals  Regina Watkins has been advised to work up to 150 minutes of moderate intensity aerobic activity a week and strengthening exercises 2-3 times per week for cardiovascular health, weight loss maintenance and preservation of muscle mass.   She has agreed to continue physical activity as is.    Pharmacotherapy We discussed various medication options to help [place] with her weight loss efforts and we both agreed to to continue with nutritional and behavioral strategies..  She may benefit from GLP-1 therapy we will consider Victoza at the next office visit.  Continue metformin for now.  ASSOCIATED CONDITIONS ADDRESSED TODAY  Type 2 diabetes mellitus with other specified complication, without long-term current use of insulin (HCC) Assessment & Plan: HgbA1c is not at goal for age and comorbid conditions. Denies symptoms of hypoglycemia or hyperglycemia. On metformin twice a day with good adherence and no side effects.  Ozempic was denied by her insurance.  Counseled on goals of care, monitoring for complications and importance of staying updated on immunizations and diabetes preventive measures.  She has an appointment with her primary care team in the near future.  She will continue continue with reduced calorie meal plan low on processed crabs and simple sugars. Ongoing weight loss will improve insulin resistance and glycemic control  Lab Results  Component Value Date   HGBA1C 7.7 (H) 12/31/2021   HGBA1C 6.4 (H) 06/07/2020   HGBA1C 6.2 (H) 10/29/2019   Lab Results  Component Value Date   LDLCALC 69 12/31/2021   CREATININE 0.84  02/06/2022   We will consider starting Victoza at the next office visit.    Obesity with current BMI of 44.6  Elevated blood pressure reading without diagnosis of hypertension Assessment & Plan: Blood pressure today has improved.  Continue to monitor losing 10% of body weight may improve condition.       Return in about 2 weeks (around 04/25/2022) for For Weight Mangement with Dr. Gerarda Fraction.Marland Kitchen She was informed of the importance of frequent follow up visits to maximize her success with intensive lifestyle modifications for her multiple health conditions.   ATTESTASTION STATEMENTS:  Reviewed by clinician on day of visit: allergies, medications, problem list, medical history, surgical history, family history, social history, and previous encounter notes.   Time spent on visit including pre-visit chart review and post-visit care and charting was 30 minutes.    Thomes Dinning, MD

## 2022-04-11 NOTE — Assessment & Plan Note (Signed)
Blood pressure today has improved.  Continue to monitor losing 10% of body weight may improve condition.

## 2022-04-29 ENCOUNTER — Ambulatory Visit (INDEPENDENT_AMBULATORY_CARE_PROVIDER_SITE_OTHER): Payer: Commercial Managed Care - HMO | Admitting: Internal Medicine

## 2022-04-29 ENCOUNTER — Encounter (INDEPENDENT_AMBULATORY_CARE_PROVIDER_SITE_OTHER): Payer: Self-pay | Admitting: Internal Medicine

## 2022-04-29 VITALS — BP 128/84 | HR 78 | Temp 98.3°F | Ht 66.0 in | Wt 273.0 lb

## 2022-04-29 DIAGNOSIS — R030 Elevated blood-pressure reading, without diagnosis of hypertension: Secondary | ICD-10-CM | POA: Diagnosis not present

## 2022-04-29 DIAGNOSIS — E669 Obesity, unspecified: Secondary | ICD-10-CM | POA: Diagnosis not present

## 2022-04-29 DIAGNOSIS — Z6841 Body Mass Index (BMI) 40.0 and over, adult: Secondary | ICD-10-CM

## 2022-04-29 DIAGNOSIS — G4733 Obstructive sleep apnea (adult) (pediatric): Secondary | ICD-10-CM | POA: Diagnosis not present

## 2022-04-29 DIAGNOSIS — E1169 Type 2 diabetes mellitus with other specified complication: Secondary | ICD-10-CM | POA: Diagnosis not present

## 2022-04-29 DIAGNOSIS — Z7984 Long term (current) use of oral hypoglycemic drugs: Secondary | ICD-10-CM

## 2022-04-29 MED ORDER — LIRAGLUTIDE 18 MG/3ML ~~LOC~~ SOPN: PEN_INJECTOR | SUBCUTANEOUS | 0 refills | Status: DC

## 2022-04-29 NOTE — Progress Notes (Unsigned)
Office: [phone]  /  Fax: [phone]  WEIGHT SUMMARY AND BIOMETRICS  Vitals Temp: 98.3 F (36.8 C) BP: 128/84 Pulse Rate: 78 SpO2: 99 %   Anthropometric Measurements Height: '5\' 6"'$  (1.676 m) Weight: 273 lb (123.8 kg) BMI (Calculated): 44.08 Weight at Last Visit: 273 lb Weight Lost Since Last Visit: o lb Starting Weight: 212 lb Total Weight Loss (lbs): 4 lb (1.814 kg)   Body Composition  Body Fat %: 50.5 % Fat Mass (lbs): 138.2 lbs Muscle Mass (lbs): 128.6 lbs Total Body Water (lbs): 95.4 lbs Visceral Fat Rating : 15   Other Clinical Data Fasting: No Labs: No Today's Visit #: 5 Starting Date: 02/06/22    HPI  Chief Complaint: OBESITY  Regina Watkins is here to discuss her progress with her obesity treatment plan. She is on the the Category 2 Plan and states she is following her eating plan approximately 60 % of the time. She states she is exercising 40 minutes 3 times per week.  Interval History:  Since last office visit she has ***. She reports fair. to prescribed reduced calorie nutrition plan. Has been working on drinking more water and diet drinks.  {ACTIONS;DENIES/REPORTS:21021675::"Denies"} problems with appetite and hunger signals.  {ACTIONS;DENIES/REPORTS:21021675::"Denies"} problems with satiety and satiation.  {ACTIONS;DENIES/REPORTS:21021675::"Denies"} problems with eating patterns and portion control.  Sleeping approximately {Numbers; 1-10:17898} hours a day.  Sleep described as {EMSLEEPREFR:28840}.  Stress levels are reported as {EMSTRESS:28843::"low and manageable"}.  Barriers identified {EMOBESITYBARRIERS:28841::"none"}.  Atkins and apple Lunch: fruit bowl, [place] sandwich lettuce and tomato. Dinner salad and piece of chicken.  No snacking, but may eat skinny popcorn.  Pharmacotherapy for weight loss: She is currently taking {EMPharmaco:28845::"no anti-obesity medication"}.   Weight promoting medications identified: Contraceptives or  hormonal therapy.  ASSESSMENT AND PLAN  TREATMENT PLAN FOR OBESITY:  Recommended Dietary Goals  Regina Watkins is currently in the action stage of change. As such, her goal is to continue weight management plan. She has agreed to the Category 2 Plan.  Behavioral Intervention  We discussed the following Behavioral Modification Strategies today: increasing lean protein intake, increasing vegetables, increasing water intake, and work on meal planning and easy cooking plans.  Additional resources provided today: NA  Recommended Physical Activity Goals  Regina Watkins has been advised to work up to 150 minutes of moderate intensity aerobic activity a week and strengthening exercises 2-3 times per week for cardiovascular health, weight loss maintenance and preservation of muscle mass.   She has agreed to : ***  Pharmacotherapy We discussed various medication options to help Regina Watkins with her weight loss efforts and we both agreed to ***.  ASSOCIATED CONDITIONS ADDRESSED TODAY  There are no diagnoses linked to this encounter.   PHYSICAL EXAM:  Blood pressure 128/84, pulse 78, temperature 98.3 F (36.8 C), height '5\' 6"'$  (1.676 m), weight 273 lb (123.8 kg), SpO2 99 %. Body mass index is 44.06 kg/m.  General: She is overweight, cooperative, alert, well developed, and in no acute distress. PSYCH: Has normal mood, affect and thought process.   HEENT: EOMI, sclerae are anicteric. Lungs: Normal breathing effort, no conversational dyspnea. Extremities: No edema.  Neurologic: No gross sensory or motor deficits. No tremors or fasciculations noted.    DIAGNOSTIC DATA REVIEWED:  BMET    Component Value Date/Time   NA 143 02/06/2022 1116   K 4.0 02/06/2022 1116   CL 104 02/06/2022 1116   CO2 24 02/06/2022 1116   GLUCOSE 127 (H) 02/06/2022 1116   GLUCOSE 162 (H) 10/11/2021 2150  BUN 8 02/06/2022 1116   CREATININE 0.84 02/06/2022 1116   CALCIUM 9.3 02/06/2022 1116   GFRNONAA >60 10/11/2021 2150    GFRAA 110 10/29/2019 0908   Lab Results  Component Value Date   HGBA1C 7.7 (H) 12/31/2021   HGBA1C 7.1 (H) 04/13/2019   Lab Results  Component Value Date   INSULIN 35.5 (H) 02/06/2022   INSULIN 31.5 (H) 11/16/2019   Lab Results  Component Value Date   TSH 0.773 12/31/2021   CBC    Component Value Date/Time   WBC 8.0 12/31/2021 1504   WBC 8.6 10/11/2021 2150   RBC 4.93 12/31/2021 1504   RBC 4.68 10/11/2021 2150   HGB 13.2 12/31/2021 1504   HCT 40.8 12/31/2021 1504   PLT 340 12/31/2021 1504   MCV 83 12/31/2021 1504   MCH 26.8 12/31/2021 1504   MCH 27.4 10/11/2021 2150   MCHC 32.4 12/31/2021 1504   MCHC 33.4 10/11/2021 2150   RDW 13.2 12/31/2021 1504   Iron Studies No results found for: "IRON", "TIBC", "FERRITIN", "IRONPCTSAT" Lipid Panel     Component Value Date/Time   CHOL 133 12/31/2021 1504   TRIG 164 (H) 12/31/2021 1504   HDL 36 (L) 12/31/2021 1504   CHOLHDL 3.7 12/31/2021 1504   LDLCALC 69 12/31/2021 1504   Hepatic Function Panel     Component Value Date/Time   PROT 7.2 02/06/2022 1116   ALBUMIN 4.6 02/06/2022 1116   AST 21 02/06/2022 1116   ALT 25 02/06/2022 1116   ALKPHOS 73 02/06/2022 1116   BILITOT 1.0 02/06/2022 1116      Component Value Date/Time   TSH 0.773 12/31/2021 1504   Nutritional Lab Results  Component Value Date   VD25OH 20.0 (L) 02/06/2022   VD25OH 15.1 (L) 06/07/2020   VD25OH 18.3 (L) 11/16/2019     No follow-ups on file.Marland Kitchen She was informed of the importance of frequent follow up visits to maximize her success with intensive lifestyle modifications for her multiple health conditions.   ATTESTASTION STATEMENTS:  Reviewed by clinician on day of visit: allergies, medications, problem list, medical history, surgical history, family history, social history, and previous encounter notes.   Time spent on visit including pre-visit chart review and post-visit care and charting was *** minutes.    Thomes Dinning, MD

## 2022-04-29 NOTE — Assessment & Plan Note (Signed)
We will reach out to her primary care team.  This patient had a sleep study 2 years ago that showed moderate sleep apnea and benefits from PAP therapy.  Losing 15% of body weight may improve condition but in the meantime I recommend that she begin CPAP therapy.

## 2022-04-30 ENCOUNTER — Ambulatory Visit (INDEPENDENT_AMBULATORY_CARE_PROVIDER_SITE_OTHER): Payer: Commercial Managed Care - HMO | Admitting: Family Medicine

## 2022-04-30 VITALS — BP 115/81 | HR 81 | Temp 98.1°F

## 2022-04-30 DIAGNOSIS — G4733 Obstructive sleep apnea (adult) (pediatric): Secondary | ICD-10-CM

## 2022-04-30 DIAGNOSIS — Z6841 Body Mass Index (BMI) 40.0 and over, adult: Secondary | ICD-10-CM

## 2022-04-30 DIAGNOSIS — E1169 Type 2 diabetes mellitus with other specified complication: Secondary | ICD-10-CM | POA: Diagnosis not present

## 2022-04-30 LAB — POCT GLYCOSYLATED HEMOGLOBIN (HGB A1C): Hemoglobin A1C: 6.8 % — AB (ref 4.0–5.6)

## 2022-04-30 MED ORDER — BD PEN NEEDLE NANO 2ND GEN 32G X 4 MM MISC: 1.0000 | Freq: Every day | 0 refills | Status: DC

## 2022-04-30 NOTE — Progress Notes (Unsigned)
Patient is here for their 3 month follow-up Patient has no concerns today Care gaps have been discussed with patient

## 2022-04-30 NOTE — Assessment & Plan Note (Signed)
Blood pressure today is improved.  Continue to monitor for now.  Recent kidney function and electrolytes within normal limits

## 2022-04-30 NOTE — Assessment & Plan Note (Signed)
Most recent hemoglobin A1c is 7.7 .  She is currently on metformin twice a day without any adverse effects.  Patient would benefit from liraglutide for diabetes management and assistance with weight loss.  After discussion of benefits and side effects she will be started on liraglutide 0.6 mg daily for 1 week then increase to 1.2 mg for 1 week and then 1.8 mg a day and stay at that dose until follow-up.

## 2022-05-02 ENCOUNTER — Encounter: Payer: Self-pay | Admitting: Family Medicine

## 2022-05-02 NOTE — Progress Notes (Signed)
Established Patient Office Visit  Subjective    Patient ID: Regina Watkins, female    DOB: [birth year]  Age: 37 y.o. MRN: [id number]  CC:  Chief Complaint  Patient presents with   Follow-up    HPI Regina Watkins presents for follow up of chronic med issues. Patient denies acute complaints or concerns.    Outpatient Encounter Medications as of 04/30/2022  Medication Sig   megestrol (MEGACE) 40 MG tablet Take 1 tablet (40 mg total) by mouth daily.   metFORMIN (GLUCOPHAGE-XR) 750 MG 24 hr tablet Take 1 tablet (750 mg total) by mouth 2 (two) times daily with a meal.   Insulin Pen Needle (BD PEN NEEDLE NANO 2ND GEN) 32G X 4 MM MISC Inject 1 Package as directed daily before breakfast. Check 2 times daily   [DISCONTINUED] Cholecalciferol 1.25 MG (50000 UT) TABS Take 50,000 Units by mouth once a week.   [DISCONTINUED] liraglutide (VICTOZA) 18 MG/3ML SOPN Inject 0.6 mg into the skin daily for 7 days, THEN 1.2 mg daily for 7 days, THEN 1.8 mg daily for 14 days.   [DISCONTINUED] medroxyPROGESTERone (PROVERA) 10 MG tablet Take 2 tablets (20 mg total) by mouth daily.   No facility-administered encounter medications on file as of 04/30/2022.    Past Medical History:  Diagnosis Date   Anemia    Back pain    BV (bacterial vaginosis)    Diabetes ([place])    Diabetes ([place])    Ectopic pregnancy    Infection    UTI   No pertinent past medical history    Obesity    Prediabetes    Sleep apnea    Strep throat    Trichomonas     Past Surgical History:  Procedure Laterality Date   SALPINGECTOMY     removal of ectopic preg and tube   TUBAL LIGATION N/A    [employer] 10/25/2019    Family History  Problem Relation Age of Onset   Hypertension Mother    Thyroid disease Mother    Diabetes Mother    Cancer Mother    Heart disease Father        CHF   Hypertension Father    Sleep apnea Father    Alcoholism Father    Obesity Father    Diabetes Maternal Grandmother    Hypertension  Maternal Grandmother    Cancer Maternal Grandmother        breast   Cancer Paternal Grandmother    Anesthesia problems Neg Hx    Hypotension Neg Hx    Malignant hyperthermia Neg Hx    Pseudochol deficiency Neg Hx     Social History   Socioeconomic History   Marital status: Single    Spouse name: Not on file   Number of children: Not on file   Years of education: Not on file   Highest education level: Not on file  Occupational History   Occupation: Paediatric nurse   Occupation: special needs at a day program  Tobacco Use   Smoking status: Never   Smokeless tobacco: Never  Vaping Use   Vaping Use: Never used  Substance and Sexual Activity   Alcohol use: No   Drug use: No   Sexual activity: Yes    Birth control/protection: Condom    Comment: monogamous rlship wiht female partner of 8 years; last intercourse one month ago  Other Topics Concern   Not on file  Social History Narrative   Not on file   Social Determinants  of Health   Financial Resource Strain: Not on file  Food Insecurity: No Food Insecurity (10/31/2021)   Hunger Vital Sign    Worried About Running Out of Food in the Last Year: Never true    Ran Out of Food in the Last Year: Never true  Transportation Needs: No Transportation Needs (10/31/2021)   PRAPARE - Hydrologist (Medical): No    Lack of Transportation (Non-Medical): No  Physical Activity: Not on file  Stress: Not on file  Social Connections: Not on file  Intimate Partner Violence: Not on file    Review of Systems  All other systems reviewed and are negative.       Objective    BP 115/81   Pulse 81   Temp 98.1 F (36.7 C) (Oral)   Physical Exam Vitals and nursing note reviewed.  Constitutional:      General: She is not in acute distress.    Appearance: She is obese.  Cardiovascular:     Rate and Rhythm: Normal rate and regular rhythm.  Pulmonary:     Effort: Pulmonary effort is normal.     Breath sounds:  Normal breath sounds.  Abdominal:     Palpations: Abdomen is soft.     Tenderness: There is no abdominal tenderness.  Musculoskeletal:     Right lower leg: No edema.     Left lower leg: No edema.  Neurological:     General: No focal deficit present.     Mental Status: She is alert and oriented to person, place, and time.         Assessment & Plan:   1. Type 2 diabetes mellitus with other specified complication, without long-term current use of insulin (HCC) Improved A1c and now at goal. Continue  - POCT glycosylated hemoglobin (Hb A1C)  2. Class 3 severe obesity due to excess calories without serious comorbidity with body mass index (BMI) of 40.0 to 44.9 in adult [facility]) Patient to take a 2-3 month hiatus on phentermine. Patient goal is 2-3 lbs/mo wt loss  3. OSA (obstructive sleep apnea) Patient with documented sleep apnea. Referred for CPAP - For home use only DME continuous positive airway pressure (CPAP)    Return in about 3 months (around 07/29/2022) for follow up.   Becky Sax, MD

## 2022-06-03 ENCOUNTER — Encounter (INDEPENDENT_AMBULATORY_CARE_PROVIDER_SITE_OTHER): Payer: Self-pay | Admitting: Internal Medicine

## 2022-06-03 ENCOUNTER — Ambulatory Visit (INDEPENDENT_AMBULATORY_CARE_PROVIDER_SITE_OTHER): Payer: Commercial Managed Care - HMO | Admitting: Internal Medicine

## 2022-06-03 VITALS — BP 116/71 | HR 100 | Temp 98.3°F | Ht 66.0 in | Wt 272.0 lb

## 2022-06-03 DIAGNOSIS — G4733 Obstructive sleep apnea (adult) (pediatric): Secondary | ICD-10-CM

## 2022-06-03 DIAGNOSIS — E669 Obesity, unspecified: Secondary | ICD-10-CM

## 2022-06-03 DIAGNOSIS — Z6841 Body Mass Index (BMI) 40.0 and over, adult: Secondary | ICD-10-CM | POA: Diagnosis not present

## 2022-06-03 DIAGNOSIS — E1169 Type 2 diabetes mellitus with other specified complication: Secondary | ICD-10-CM | POA: Diagnosis not present

## 2022-06-03 DIAGNOSIS — Z7985 Long-term (current) use of injectable non-insulin antidiabetic drugs: Secondary | ICD-10-CM

## 2022-06-03 NOTE — Assessment & Plan Note (Signed)
Patient has been referred for sleep study or to obtain supplies.

## 2022-06-03 NOTE — Assessment & Plan Note (Signed)
Currently asymptomatic not regularly monitoring.  Her insurance has denied Victoza they are wanting her to try Trulicity.  At present time patient is not interested in injectables.  She will continue on metformin XR 750 twice a day.  Continue with nutritional and behavioral strategies for weight loss

## 2022-06-03 NOTE — Progress Notes (Signed)
Office: [phone]  /  Fax: [phone]  WEIGHT SUMMARY AND BIOMETRICS  Vitals Temp: 98.3 F (36.8 C) BP: 116/71 Pulse Rate: 100 SpO2: 98 %   Anthropometric Measurements Height: 5\' 6"  (1.676 m) Weight: 272 lb (123.4 kg) BMI (Calculated): 43.92 Weight at Last Visit: 273 lb Weight Lost Since Last Visit: 1 lb Starting Weight: 212 lb Total Weight Loss (lbs): 5 lb (2.268 kg)   Body Composition  Body Fat %: 49.5 % Fat Mass (lbs): 134.8 lbs Muscle Mass (lbs): 130.4 lbs Total Body Water (lbs): 92.8 lbs Visceral Fat Rating : 14    HPI  Chief Complaint: OBESITY  Regina Watkins is here to discuss her progress with her obesity treatment plan. She is on the the Category 2 Plan and states she is following her eating plan approximately 50 % of the time. She states she is exercising 30 minutes 4 times per week.  Interval History:  Since last office visit she has lost 1 pound. She reports fair adherence to reduced calorie nutritional plan. She has been working on not skipping meals, increasing protein at every meal, eating more fruits, eating more vegetables, and drinking more water Denies problems with appetite and hunger signals.  Denies problems with satiety and satiation.  Denies problems with eating patterns and portion control.  Denies abnormal cravings. Denies feeling deprived or restricted.  Protein shake for breakfast, sandwich for lunch, no appetite for dinner.   Barriers identified: work schedule and predilection for convenience or prepackaged foods.   Pharmacotherapy for weight loss: She is currently taking no anti-obesity medication.    ASSESSMENT AND PLAN  TREATMENT PLAN FOR OBESITY:  Recommended Dietary Goals  Regina Watkins is currently in the action stage of change. As such, her goal is to continue weight management plan. She has agreed to: continue current plan  Behavioral Intervention  We discussed the following Behavioral Modification Strategies today:  increasing lean protein intake, decreasing simple carbohydrates , increasing vegetables, increasing lower glycemic fruits, increasing fiber rich foods, and avoiding skipping meals.  Additional resources provided today: None  Recommended Physical Activity Goals  Regina Watkins has been advised to work up to 150 minutes of moderate intensity aerobic activity a week and strengthening exercises 2-3 times per week for cardiovascular health, weight loss maintenance and preservation of muscle mass.   She has agreed to :  Think about ways to increase physical activity  Pharmacotherapy We discussed various medication options to help Regina Watkins with her weight loss efforts and we both agreed to : continue with nutritional and behavioral strategies  ASSOCIATED CONDITIONS ADDRESSED TODAY  Obesity with current BMI of 44.6  Type 2 diabetes mellitus with other specified complication, without long-term current use of insulin Assessment & Plan: Currently asymptomatic not regularly monitoring.  Her insurance has denied Victoza they are wanting her to try Trulicity.  At present time patient is not interested in injectables.  She will continue on metformin XR 750 twice a day.  Continue with nutritional and behavioral strategies for weight loss   OSA (obstructive sleep apnea) Assessment & Plan: Patient has been referred for sleep study or to obtain supplies.      PHYSICAL EXAM:  Blood pressure 116/71, pulse 100, temperature 98.3 F (36.8 C), height 5\' 6"  (1.676 m), weight 272 lb (123.4 kg), SpO2 98 %. Body mass index is 43.9 kg/m.  General: She is overweight, cooperative, alert, well developed, and in no acute distress. PSYCH: Has normal mood, affect and thought process.   HEENT: EOMI, sclerae are  anicteric. Lungs: Normal breathing effort, no conversational dyspnea. Extremities: No edema.  Neurologic: No gross sensory or motor deficits. No tremors or fasciculations noted.    DIAGNOSTIC DATA  REVIEWED:  BMET    Component Value Date/Time   NA 143 02/06/2022 1116   K 4.0 02/06/2022 1116   CL 104 02/06/2022 1116   CO2 24 02/06/2022 1116   GLUCOSE 127 (H) 02/06/2022 1116   GLUCOSE 162 (H) 10/11/2021 2150   BUN 8 02/06/2022 1116   CREATININE 0.84 02/06/2022 1116   CALCIUM 9.3 02/06/2022 1116   GFRNONAA >60 10/11/2021 2150   GFRAA 110 10/29/2019 0908   Lab Results  Component Value Date   HGBA1C 6.8 (A) 04/30/2022   HGBA1C 7.1 (H) 04/13/2019   Lab Results  Component Value Date   INSULIN 35.5 (H) 02/06/2022   INSULIN 31.5 (H) 11/16/2019   Lab Results  Component Value Date   TSH 0.773 12/31/2021   CBC    Component Value Date/Time   WBC 8.0 12/31/2021 1504   WBC 8.6 10/11/2021 2150   RBC 4.93 12/31/2021 1504   RBC 4.68 10/11/2021 2150   HGB 13.2 12/31/2021 1504   HCT 40.8 12/31/2021 1504   PLT 340 12/31/2021 1504   MCV 83 12/31/2021 1504   MCH 26.8 12/31/2021 1504   MCH 27.4 10/11/2021 2150   MCHC 32.4 12/31/2021 1504   MCHC 33.4 10/11/2021 2150   RDW 13.2 12/31/2021 1504   Iron Studies No results found for: "IRON", "TIBC", "FERRITIN", "IRONPCTSAT" Lipid Panel     Component Value Date/Time   CHOL 133 12/31/2021 1504   TRIG 164 (H) 12/31/2021 1504   HDL 36 (L) 12/31/2021 1504   CHOLHDL 3.7 12/31/2021 1504   LDLCALC 69 12/31/2021 1504   Hepatic Function Panel     Component Value Date/Time   PROT 7.2 02/06/2022 1116   ALBUMIN 4.6 02/06/2022 1116   AST 21 02/06/2022 1116   ALT 25 02/06/2022 1116   ALKPHOS 73 02/06/2022 1116   BILITOT 1.0 02/06/2022 1116      Component Value Date/Time   TSH 0.773 12/31/2021 1504   Nutritional Lab Results  Component Value Date   VD25OH 20.0 (L) 02/06/2022   VD25OH 15.1 (L) 06/07/2020   VD25OH 18.3 (L) 11/16/2019     Return in about 3 weeks (around 06/24/2022) for For Weight Mangement with Dr. Gerarda Fraction - Follow-up tracking and journaling calories.. She was informed of the importance of frequent follow up  visits to maximize her success with intensive lifestyle modifications for her multiple health conditions.   ATTESTASTION STATEMENTS:  Reviewed by clinician on day of visit: allergies, medications, problem list, medical history, surgical history, family history, social history, and previous encounter notes.     Thomes Dinning, MD

## 2022-06-06 ENCOUNTER — Telehealth (INDEPENDENT_AMBULATORY_CARE_PROVIDER_SITE_OTHER): Payer: Self-pay | Admitting: Internal Medicine

## 2022-06-06 NOTE — Telephone Encounter (Signed)
PA for Victoza Denied. Pt needs to fail trulicity and or DM2 with cardiovascular dx

## 2022-06-24 ENCOUNTER — Encounter (INDEPENDENT_AMBULATORY_CARE_PROVIDER_SITE_OTHER): Payer: Self-pay | Admitting: Internal Medicine

## 2022-06-24 ENCOUNTER — Ambulatory Visit (INDEPENDENT_AMBULATORY_CARE_PROVIDER_SITE_OTHER): Payer: Commercial Managed Care - HMO | Admitting: Internal Medicine

## 2022-06-24 VITALS — BP 132/82 | HR 62 | Temp 98.7°F | Ht 66.0 in | Wt 272.0 lb

## 2022-06-24 DIAGNOSIS — E1169 Type 2 diabetes mellitus with other specified complication: Secondary | ICD-10-CM | POA: Diagnosis not present

## 2022-06-24 DIAGNOSIS — Z6841 Body Mass Index (BMI) 40.0 and over, adult: Secondary | ICD-10-CM

## 2022-06-24 DIAGNOSIS — G4733 Obstructive sleep apnea (adult) (pediatric): Secondary | ICD-10-CM | POA: Diagnosis not present

## 2022-06-24 DIAGNOSIS — Z7985 Long-term (current) use of injectable non-insulin antidiabetic drugs: Secondary | ICD-10-CM

## 2022-06-24 MED ORDER — SEMAGLUTIDE-WEIGHT MANAGEMENT 0.25 MG/0.5ML ~~LOC~~ SOAJ: 0.2500 mg | SUBCUTANEOUS | 0 refills | Status: AC

## 2022-06-24 NOTE — Progress Notes (Signed)
Office: [phone]  /  Fax: [phone]  WEIGHT SUMMARY AND BIOMETRICS  Vitals Temp: 98.7 F (37.1 C) BP: 132/82 Pulse Rate: 62 SpO2: 100 %   Anthropometric Measurements Height:  (1.676 m) Weight: 272 lb (123.4 kg) BMI (Calculated): 43.92 Weight at Last Visit: 272 lb Weight Lost Since Last Visit: 0 lb Weight Gained Since Last Visit: 0 lb Starting Weight: 273 lb Total Weight Loss (lbs): 1 lb (0.454 kg)   Body Composition  Body Fat %: 50.1 % Fat Mass (lbs): 136.4 lbs Muscle Mass (lbs): 129 lbs Total Body Water (lbs): 94.4 lbs Visceral Fat Rating : 14    No data recorded Today's Visit #: 7  Starting Date: 02/06/22   HPI  Chief Complaint: OBESITY  Regina Watkins is here to discuss her progress with her obesity treatment plan. She is on the the Category 2 Plan and states she is following her eating plan approximately 60 % of the time. She states she is exercising 40 minutes 4 times per week.  Interval History:  Since last office visit she has remained weight neutral. She reports good adherence to reduced calorie nutritional plan. She has been working on reading food labels, not skipping meals, increasing protein intake at every meal, and drinking more water Reports problems with appetite and hunger signals.  Reports problems with satiety and satiation.  Denies problems with eating patterns and portion control.  Reports abnormal cravings. Denies feeling deprived or restricted.   Barriers identified: having difficulty preparing healthy meals, having difficulty with meal prep and planning, exposure to enticing environments and or relationships, and predilection for convenience or prepackaged foods.   Pharmacotherapy for weight loss: She is currently taking Metformin (off label use for incretin effect and / or insulin resistance and / or diabetes prevention) .    ASSESSMENT AND PLAN  TREATMENT PLAN FOR OBESITY:  Recommended Dietary Goals  Regina Watkins is  currently in the action stage of change. As such, her goal is to continue weight management plan. She has agreed to: continue current plan  Behavioral Intervention  We discussed the following Behavioral Modification Strategies today: increasing lean protein intake, decreasing simple carbohydrates , increasing vegetables, increasing lower glycemic fruits, increasing water intake, continue to practice mindfulness when eating, and planning for success.  Additional resources provided today: None  Recommended Physical Activity Goals  Regina Watkins has been advised to work up to 150 minutes of moderate intensity aerobic activity a week and strengthening exercises 2-3 times per week for cardiovascular health, weight loss maintenance and preservation of muscle mass.   She has agreed to :  Think about ways to increase physical activity  Pharmacotherapy We discussed various medication options to help Regina Watkins with her weight loss efforts and we both agreed to : start anti-obesity medication. In addition to reduced calorie nutrition plan (RCNP), behavioral strategies and physical activity, Regina Watkins would benefit from pharmacotherapy to assist with hunger signals, satiety and cravings. This will reduce obesity-related health risks by inducing weight loss, and help reduce food consumption and adherence to [facility]) . It may also improve QOL by improving self-confidence and reduce the  setbacks associated with metabolic adaptations.  After discussion of treatment options, mechanisms of action, benefits, side effects, contraindications and shared decision making she is agreeable to starting Wegovy 0.25 mg once a week.  This will help over with weight loss and control of her diabetes.  Her insurance had denied Ozempic and does not cover Mounjaro.  Trulicity is not indicated for weight management which  is what is covered by her insurance.  Patient also made aware that medication is indicated for long-term management of  obesity and the risk of weight regain following discontinuation of treatment and hence the importance of adhering to medical weight loss plan.  We demonstrated use of device and patient using teach back method was able to demonstrate proper technique.  ASSOCIATED CONDITIONS ADDRESSED TODAY  Class 3 severe obesity without serious comorbidity with body mass index (BMI) of 40.0 to 44.9 in adult, unspecified obesity type -     Semaglutide-Weight Management; Inject 0.25 mg into the skin once a week for 28 days.  Dispense: 2 mL; Refill: 0  OSA (obstructive sleep apnea) Assessment & Plan: Patient still has not started CPAP therapy not sure if this is on the supplier or patient end.  Advised to follow-up also counseled on the risk associated with untreated sleep apnea.  Patient benefits from weight loss.  Orders: -     Semaglutide-Weight Management; Inject 0.25 mg into the skin once a week for 28 days.  Dispense: 2 mL; Refill: 0  Type 2 diabetes mellitus with other specified complication, without long-term current use of insulin Assessment & Plan: Currently on metformin 750 mg twice daily without any adverse effects.  Patient benefits from incretin therapy as she has multiple obesity related medical comorbidities.  We will prescribe Wegovy.      PHYSICAL EXAM:  Blood pressure 132/82, pulse 62, temperature 98.7 F (37.1 C), height 5\' 6"  (1.676 m), weight 272 lb (123.4 kg), SpO2 100 %. Body mass index is 43.9 kg/m.  General: She is overweight, cooperative, alert, well developed, and in no acute distress. PSYCH: Has normal mood, affect and thought process.   HEENT: EOMI, sclerae are anicteric. Lungs: Normal breathing effort, no conversational dyspnea. Extremities: No edema.  Neurologic: No gross sensory or motor deficits. No tremors or fasciculations noted.    DIAGNOSTIC DATA REVIEWED:  BMET    Component Value Date/Time   NA 143 02/06/2022 1116   K 4.0 02/06/2022 1116   CL 104  02/06/2022 1116   CO2 24 02/06/2022 1116   GLUCOSE 127 (H) 02/06/2022 1116   GLUCOSE 162 (H) 10/11/2021 2150   BUN 8 02/06/2022 1116   CREATININE 0.84 02/06/2022 1116   CALCIUM 9.3 02/06/2022 1116   GFRNONAA >60 10/11/2021 2150   GFRAA 110 10/29/2019 0908   Lab Results  Component Value Date   HGBA1C 6.8 (A) 04/30/2022   HGBA1C 7.1 (H) 04/13/2019   Lab Results  Component Value Date   INSULIN 35.5 (H) 02/06/2022   INSULIN 31.5 (H) 11/16/2019   Lab Results  Component Value Date   TSH 0.773 12/31/2021   CBC    Component Value Date/Time   WBC 8.0 12/31/2021 1504   WBC 8.6 10/11/2021 2150   RBC 4.93 12/31/2021 1504   RBC 4.68 10/11/2021 2150   HGB 13.2 12/31/2021 1504   HCT 40.8 12/31/2021 1504   PLT 340 12/31/2021 1504   MCV 83 12/31/2021 1504   MCH 26.8 12/31/2021 1504   MCH 27.4 10/11/2021 2150   MCHC 32.4 12/31/2021 1504   MCHC 33.4 10/11/2021 2150   RDW 13.2 12/31/2021 1504   Iron Studies No results found for: "IRON", "TIBC", "FERRITIN", "IRONPCTSAT" Lipid Panel     Component Value Date/Time   CHOL 133 12/31/2021 1504   TRIG 164 (H) 12/31/2021 1504   HDL 36 (L) 12/31/2021 1504   CHOLHDL 3.7 12/31/2021 1504   LDLCALC 69 12/31/2021 1504  Hepatic Function Panel     Component Value Date/Time   PROT 7.2 02/06/2022 1116   ALBUMIN 4.6 02/06/2022 1116   AST 21 02/06/2022 1116   ALT 25 02/06/2022 1116   ALKPHOS 73 02/06/2022 1116   BILITOT 1.0 02/06/2022 1116      Component Value Date/Time   TSH 0.773 12/31/2021 1504   Nutritional Lab Results  Component Value Date   VD25OH 20.0 (L) 02/06/2022   VD25OH 15.1 (L) 06/07/2020   VD25OH 18.3 (L) 11/16/2019     Return in about 2 weeks (around 07/08/2022) for For Weight Mangement with Dr. Rikki Spearing.Marland Kitchen She was informed of the importance of frequent follow up visits to maximize her success with intensive lifestyle modifications for her multiple health conditions.   ATTESTASTION STATEMENTS:  Reviewed by  clinician on day of visit: allergies, medications, problem list, medical history, surgical history, family history, social history, and previous encounter notes.     Worthy Rancher, MD

## 2022-06-25 NOTE — Assessment & Plan Note (Signed)
Patient still has not started CPAP therapy not sure if this is on the supplier or patient end.  Advised to follow-up also counseled on the risk associated with untreated sleep apnea.  Patient benefits from weight loss.

## 2022-06-25 NOTE — Assessment & Plan Note (Signed)
Currently on metformin 750 mg twice daily without any adverse effects.  Patient benefits from incretin therapy as she has multiple obesity related medical comorbidities.  We will prescribe Wegovy.

## 2022-07-18 ENCOUNTER — Ambulatory Visit (INDEPENDENT_AMBULATORY_CARE_PROVIDER_SITE_OTHER): Payer: Commercial Managed Care - HMO | Admitting: Internal Medicine

## 2022-07-30 ENCOUNTER — Ambulatory Visit: Payer: Commercial Managed Care - HMO | Admitting: Family Medicine

## 2022-08-16 ENCOUNTER — Encounter: Payer: Self-pay | Admitting: Family Medicine

## 2022-08-16 ENCOUNTER — Other Ambulatory Visit (HOSPITAL_COMMUNITY)
Admission: RE | Admit: 2022-08-16 | Discharge: 2022-08-16 | Disposition: A | Discharge status: Home / Self Care | Payer: Commercial Managed Care - HMO | Source: Ambulatory Visit | Attending: Family Medicine | Admitting: Family Medicine

## 2022-08-16 ENCOUNTER — Ambulatory Visit (INDEPENDENT_AMBULATORY_CARE_PROVIDER_SITE_OTHER): Payer: Commercial Managed Care - HMO

## 2022-08-16 VITALS — BP 109/69 | HR 83 | Ht 66.0 in | Wt 267.2 lb

## 2022-08-16 DIAGNOSIS — Z113 Encounter for screening for infections with a predominantly sexual mode of transmission: Secondary | ICD-10-CM | POA: Diagnosis present

## 2022-08-16 DIAGNOSIS — N898 Other specified noninflammatory disorders of vagina: Secondary | ICD-10-CM

## 2022-08-16 MED ORDER — METRONIDAZOLE 500 MG PO TABS: 500.0000 mg | ORAL_TABLET | Freq: Two times a day (BID) | ORAL | 0 refills | Status: DC

## 2022-08-16 MED ORDER — FLUCONAZOLE 150 MG PO TABS: 150.0000 mg | ORAL_TABLET | Freq: Once | ORAL | 0 refills | Status: AC

## 2022-08-16 NOTE — Progress Notes (Signed)
Regina Watkins is here with concern of STD testing, just routine. Patient does report discomfort and vaginal itching after using [employer]; symptoms present for 4 days. Patient tried to prevent by taking cranberry juice and pills--unsuccessful.   Pertinent history: recurrent BV   Plan of care: Self swab instructions given and specimen obtained. Explained patient will be contacted with any abnormal results. STD bloodwork also drawn by lab. Patient requested we go ahead and send in treatment for BV. Rx sent to pharmacy on file.   Patient is due for annual exam; offered for patient to schedule during checkout.  Meryl Crutch, RN 08/16/2022  8:45 AM

## 2022-08-17 LAB — HIV ANTIBODY (ROUTINE TESTING W REFLEX): HIV Screen 4th Generation wRfx: NONREACTIVE

## 2022-08-17 LAB — HEPATITIS C ANTIBODY: Hep C Virus Ab: NONREACTIVE

## 2022-08-17 LAB — RPR: RPR Ser Ql: NONREACTIVE

## 2022-08-17 LAB — HEPATITIS B SURFACE ANTIGEN: Hepatitis B Surface Ag: NEGATIVE

## 2022-08-19 ENCOUNTER — Telehealth: Payer: Self-pay | Admitting: Family Medicine

## 2022-08-19 LAB — CERVICOVAGINAL ANCILLARY ONLY
Bacterial Vaginitis (gardnerella): NEGATIVE
Candida Glabrata: NEGATIVE
Candida Vaginitis: NEGATIVE
Chlamydia: NEGATIVE
Comment: NEGATIVE
Comment: NEGATIVE
Comment: NEGATIVE
Comment: NEGATIVE
Comment: NEGATIVE
Comment: NORMAL
Neisseria Gonorrhea: NEGATIVE
Trichomonas: NEGATIVE

## 2022-08-19 NOTE — Telephone Encounter (Signed)
Called pt to let her know paperwork for medical evaluation is ready for pick up, left voicemail on pts phone

## 2022-08-21 ENCOUNTER — Encounter (INDEPENDENT_AMBULATORY_CARE_PROVIDER_SITE_OTHER): Payer: Self-pay | Admitting: Internal Medicine

## 2022-08-21 ENCOUNTER — Ambulatory Visit (INDEPENDENT_AMBULATORY_CARE_PROVIDER_SITE_OTHER): Payer: Commercial Managed Care - HMO | Admitting: Internal Medicine

## 2022-08-21 VITALS — BP 121/81 | HR 75 | Temp 98.3°F | Ht 66.0 in | Wt 268.0 lb

## 2022-08-21 DIAGNOSIS — Z6841 Body Mass Index (BMI) 40.0 and over, adult: Secondary | ICD-10-CM | POA: Diagnosis not present

## 2022-08-21 DIAGNOSIS — G4733 Obstructive sleep apnea (adult) (pediatric): Secondary | ICD-10-CM | POA: Diagnosis not present

## 2022-08-21 DIAGNOSIS — Z7984 Long term (current) use of oral hypoglycemic drugs: Secondary | ICD-10-CM

## 2022-08-21 DIAGNOSIS — E1169 Type 2 diabetes mellitus with other specified complication: Secondary | ICD-10-CM

## 2022-08-21 MED ORDER — OZEMPIC (0.25 OR 0.5 MG/DOSE) 2 MG/3ML ~~LOC~~ SOPN
0.2500 mg | PEN_INJECTOR | SUBCUTANEOUS | 0 refills | Status: DC
Start: 2022-08-21 — End: 2022-11-13

## 2022-08-21 NOTE — Assessment & Plan Note (Addendum)
Patient still has not started CPAP therapy .  She is having problems with affordability of device she is now looking into an oral appliance.  She has an AHI of 17 which is moderate.  She has lost close to 9% of total body weight to improve condition she needs to lose at least 15%.  Incretin therapy would be helpful to meet this goal.

## 2022-08-21 NOTE — Progress Notes (Signed)
Office: [phone]  /  Fax: [phone]  WEIGHT SUMMARY AND BIOMETRICS  Vitals Temp: 98.3 F (36.8 C) BP: 121/81 Pulse Rate: 75 SpO2: 96 %   Anthropometric Measurements Height: 5\' 6"  (1.676 m) Weight: 268 lb (121.6 kg) BMI (Calculated): 43.28 Weight at Last Visit: 272 lb Weight Lost Since Last Visit: 4 lb Starting Weight: 273 lb Total Weight Loss (lbs): 5 lb (2.268 kg)   Body Composition  Body Fat %: 48.2 % Fat Mass (lbs): 129.6 lbs Muscle Mass (lbs): 132.2 lbs Total Body Water (lbs): 92.8 lbs Visceral Fat Rating : 14    No data recorded Today's Visit #: 8  Starting Date: 02/06/22   HPI  Chief Complaint: OBESITY  Regina Watkins is here to discuss her progress with her obesity treatment plan. She is on the the Category 2 Plan and states she is following her eating plan approximately 50 % of the time. She states she is exercising 30 minutes 5 times per week.  Interval History:  Since last office visit she has lost 4 pounds.  Unfortunately she recently lost her mother unexpectedly and has been grieving.  As a result she has been skipping some meals. She reports fair adherence to reduced calorie nutritional plan. She has been working on reading food labels, eating more fruits, and drinking more water  Orixegenic Control: Reports problems with appetite and hunger signals.  Denies problems with satiety and satiation.  Denies problems with eating patterns and portion control.  Reports abnormal cravings. Denies feeling deprived or restricted.   Barriers identified: strong hunger signals and appetite, having difficulty focusing on healthy eating, physical inactivity, and work schedule.   Pharmacotherapy for weight loss: She is currently taking Metformin (off label use for incretin effect and / or insulin resistance and / or diabetes prevention) with adequate clinical response  and without side effects..    ASSESSMENT AND PLAN  TREATMENT PLAN FOR  OBESITY:  Recommended Dietary Goals  Regina Watkins is currently in the action stage of change. As such, her goal is to continue weight management plan. She has agreed to: continue current plan  Behavioral Intervention  We discussed the following Behavioral Modification Strategies today: increasing lean protein intake, decreasing simple carbohydrates , increasing vegetables, increasing lower glycemic fruits, increasing fiber rich foods, avoiding skipping meals, increasing water intake, work on managing stress, creating time for self-care and relaxation measures, continue to work on implementation of reduced calorie nutritional plan, and planning for success.  Additional resources provided today: None  Recommended Physical Activity Goals  Regina Watkins has been advised to work up to 150 minutes of moderate intensity aerobic activity a week and strengthening exercises 2-3 times per week for cardiovascular health, weight loss maintenance and preservation of muscle mass.   She has agreed to :  Think about ways to increase daily physical activity and overcoming barriers to exercise and Increase physical activity in their day and reduce sedentary time (increase NEAT).  Pharmacotherapy We discussed various medication options to help Regina Watkins with her weight loss efforts and we both agreed to : Start incretin therapy with either semaglutide or tirzepatide.  We discussed various medication options to help Regina Watkins with her weight loss efforts and we both agreed to start incretin therapy in addition to reduced calorie nutrition plan (RCNP), behavioral strategies and physical activity, Regina Watkins would benefit from pharmacotherapy to assist with hunger signals, satiety and cravings. This will reduce obesity-related health risks by inducing weight loss, and help reduce food consumption and adherence to [name]) .  It may also improve QOL by improving self-confidence and reduce the  setbacks associated with metabolic  adaptations.   After discussion of treatment options, mechanisms of action, benefits, side effects, contraindications and shared decision making she is agreeable to starting semaglutide 0.25 mg once a week.  This will assist her with diabetes and management of her obesity she has multiple obesity related conditions including untreated OSA and type 2 diabetes .Trulicity is not indicated for weight management which is what is preferred by her insurance.  Patient also made aware that medication is indicated for long-term management of obesity and the risk of weight regain following discontinuation of treatment and hence the importance of adhering to medical weight loss plan.  We demonstrated use of device and patient using teach back method was able to demonstrate proper technique.  ASSOCIATED CONDITIONS ADDRESSED TODAY  Class 3 severe obesity without serious comorbidity with body mass index (BMI) of 40.0 to 44.9 in adult, unspecified obesity type Regina Watkins) Assessment & Plan: Patient has lost approximately 25 pounds since April close to 9% of total body weight with nutritional and behavioral strategies.  She benefits from incretin therapy.  We will prescribe semaglutide 0.25 mg once a week to improve glycemic control and assist patient with her weight loss efforts.   Type 2 diabetes mellitus with other specified complication, without long-term current use of insulin (HCC) Assessment & Plan: HgbA1c is not at goal for age and comorbid conditions.  I would like to see her A1c under 6.4.  She is currently on metformin twice daily without any adverse effects.  She benefits from incretin therapy as this will assist with glycemic control and weight loss efforts.  Counseled on goals of care, monitoring for complications and importance of staying updated on immunizations and diabetes preventive measures. Continue with reduced calorie meal plan low on processed crabs and simple sugars. Ongoing weight loss will improve  insulin resistance and glycemic control  Lab Results  Component Value Date   HGBA1C 6.8 (A) 04/30/2022   HGBA1C 7.7 (H) 12/31/2021   HGBA1C 6.4 (H) 06/07/2020   Lab Results  Component Value Date   LDLCALC 69 12/31/2021   CREATININE 0.84 02/06/2022      Orders: -     Ozempic (0.25 or 0.5 MG/DOSE); Inject 0.25 mg into the skin once a week.  Dispense: 3 mL; Refill: 0  OSA (obstructive sleep apnea) Assessment & Plan: Patient still has not started CPAP therapy .  She is having problems with affordability of device she is now looking into an oral appliance.  She has an AHI of 17 which is moderate.  She has lost close to 9% of total body weight to improve condition she needs to lose at least 15%.  Incretin therapy would be helpful to meet this goal.     PHYSICAL EXAM:  Blood pressure 121/81, pulse 75, temperature 98.3 F (36.8 C), height 5\' 6"  (1.676 m), weight 268 lb (121.6 kg), last menstrual period 07/11/2022, SpO2 96 %. Body mass index is 43.26 kg/m.  General: She is overweight, cooperative, alert, well developed, and in no acute distress. PSYCH: Has normal mood, affect and thought process.   HEENT: EOMI, sclerae are anicteric. Lungs: Normal breathing effort, no conversational dyspnea. Extremities: No edema.  Neurologic: No gross sensory or motor deficits. No tremors or fasciculations noted.    DIAGNOSTIC DATA REVIEWED:  BMET    Component Value Date/Time   NA 143 02/06/2022 1116   K 4.0 02/06/2022 1116  CL 104 02/06/2022 1116   CO2 24 02/06/2022 1116   GLUCOSE 127 (H) 02/06/2022 1116   GLUCOSE 162 (H) 10/11/2021 2150   BUN 8 02/06/2022 1116   CREATININE 0.84 02/06/2022 1116   CALCIUM 9.3 02/06/2022 1116   GFRNONAA >60 10/11/2021 2150   GFRAA 110 10/29/2019 0908   Lab Results  Component Value Date   HGBA1C 6.8 (A) 04/30/2022   HGBA1C 7.1 (H) 04/13/2019   Lab Results  Component Value Date   INSULIN 35.5 (H) 02/06/2022   INSULIN 31.5 (H) 11/16/2019   Lab  Results  Component Value Date   TSH 0.773 12/31/2021   CBC    Component Value Date/Time   WBC 8.0 12/31/2021 1504   WBC 8.6 10/11/2021 2150   RBC 4.93 12/31/2021 1504   RBC 4.68 10/11/2021 2150   HGB 13.2 12/31/2021 1504   HCT 40.8 12/31/2021 1504   PLT 340 12/31/2021 1504   MCV 83 12/31/2021 1504   MCH 26.8 12/31/2021 1504   MCH 27.4 10/11/2021 2150   MCHC 32.4 12/31/2021 1504   MCHC 33.4 10/11/2021 2150   RDW 13.2 12/31/2021 1504   Iron Studies No results found for: "IRON", "TIBC", "FERRITIN", "IRONPCTSAT" Lipid Panel     Component Value Date/Time   CHOL 133 12/31/2021 1504   TRIG 164 (H) 12/31/2021 1504   HDL 36 (L) 12/31/2021 1504   CHOLHDL 3.7 12/31/2021 1504   LDLCALC 69 12/31/2021 1504   Hepatic Function Panel     Component Value Date/Time   PROT 7.2 02/06/2022 1116   ALBUMIN 4.6 02/06/2022 1116   AST 21 02/06/2022 1116   ALT 25 02/06/2022 1116   ALKPHOS 73 02/06/2022 1116   BILITOT 1.0 02/06/2022 1116      Component Value Date/Time   TSH 0.773 12/31/2021 1504   Nutritional Lab Results  Component Value Date   VD25OH 20.0 (L) 02/06/2022   VD25OH 15.1 (L) 06/07/2020   VD25OH 18.3 (L) 11/16/2019     Return in about 3 weeks (around 09/11/2022) for For Weight Mangement with Dr. Rikki Spearing.Marland Kitchen She was informed of the importance of frequent follow up visits to maximize her success with intensive lifestyle modifications for her multiple health conditions.   ATTESTASTION STATEMENTS:  Reviewed by clinician on day of visit: allergies, medications, problem list, medical history, surgical history, family history, social history, and previous encounter notes.     Worthy Rancher, MD

## 2022-08-21 NOTE — Assessment & Plan Note (Signed)
HgbA1c is not at goal for age and comorbid conditions.  I would like to see her A1c under 6.4.  She is currently on metformin twice daily without any adverse effects.  She benefits from incretin therapy as this will assist with glycemic control and weight loss efforts.  Counseled on goals of care, monitoring for complications and importance of staying updated on immunizations and diabetes preventive measures. Continue with reduced calorie meal plan low on processed crabs and simple sugars. Ongoing weight loss will improve insulin resistance and glycemic control  Lab Results  Component Value Date   HGBA1C 6.8 (A) 04/30/2022   HGBA1C 7.7 (H) 12/31/2021   HGBA1C 6.4 (H) 06/07/2020   Lab Results  Component Value Date   LDLCALC 69 12/31/2021   CREATININE 0.84 02/06/2022

## 2022-08-21 NOTE — Assessment & Plan Note (Signed)
Patient has lost approximately 25 pounds since April close to 9% of total body weight with nutritional and behavioral strategies.  She benefits from incretin therapy.  We will prescribe semaglutide 0.25 mg once a week to improve glycemic control and assist patient with her weight loss efforts.

## 2022-09-04 ENCOUNTER — Ambulatory Visit (INDEPENDENT_AMBULATORY_CARE_PROVIDER_SITE_OTHER): Payer: Commercial Managed Care - HMO | Admitting: Family Medicine

## 2022-09-04 ENCOUNTER — Encounter: Payer: Self-pay | Admitting: Family Medicine

## 2022-09-04 VITALS — BP 119/82 | HR 80 | Temp 97.7°F | Resp 16 | Wt 273.0 lb

## 2022-09-04 DIAGNOSIS — Z7689 Persons encountering health services in other specified circumstances: Secondary | ICD-10-CM | POA: Diagnosis not present

## 2022-09-04 DIAGNOSIS — R300 Dysuria: Secondary | ICD-10-CM | POA: Diagnosis not present

## 2022-09-04 DIAGNOSIS — M545 Low back pain, unspecified: Secondary | ICD-10-CM

## 2022-09-04 DIAGNOSIS — Z6841 Body Mass Index (BMI) 40.0 and over, adult: Secondary | ICD-10-CM | POA: Diagnosis not present

## 2022-09-04 LAB — POCT URINALYSIS DIP (CLINITEK)
Bilirubin, UA: NEGATIVE
Glucose, UA: NEGATIVE mg/dL
Ketones, POC UA: NEGATIVE mg/dL
Nitrite, UA: NEGATIVE
POC PROTEIN,UA: NEGATIVE
Spec Grav, UA: 1.02 (ref 1.010–1.025)
Urobilinogen, UA: 0.2 E.U./dL
pH, UA: 5.5 (ref 5.0–8.0)

## 2022-09-04 MED ORDER — NITROFURANTOIN MONOHYD MACRO 100 MG PO CAPS: 100.0000 mg | ORAL_CAPSULE | Freq: Two times a day (BID) | ORAL | 0 refills | Status: DC

## 2022-09-04 NOTE — Progress Notes (Signed)
Established Patient Office Visit  Subjective    Patient ID: Regina Watkins, female    DOB: 25-Apr-1985  Age: 37 y.o. MRN: [id number]  CC:  Chief Complaint  Patient presents with   Follow-up   Diabetes    HPI Regina Watkins presents for follow up of chronic med issues. Patient also reports some flank pain and +/- dysuria. She denies fever/chills.    Outpatient Encounter Medications as of 09/04/2022  Medication Sig   Insulin Pen Needle (BD PEN NEEDLE NANO 2ND GEN) 32G X 4 MM MISC Inject 1 Package as directed daily before breakfast. Check 2 times daily   megestrol (MEGACE) 40 MG tablet Take 1 tablet (40 mg total) by mouth daily.   metFORMIN (GLUCOPHAGE-XR) 750 MG 24 hr tablet Take 1 tablet (750 mg total) by mouth 2 (two) times daily with a meal.   nitrofurantoin, macrocrystal-monohydrate, (MACROBID) 100 MG capsule Take 1 capsule (100 mg total) by mouth 2 (two) times daily.   Semaglutide,0.25 or 0.5MG /DOS, (OZEMPIC, 0.25 OR 0.5 MG/DOSE,) 2 MG/3ML SOPN Inject 0.25 mg into the skin once a week.   [DISCONTINUED] metroNIDAZOLE (FLAGYL) 500 MG tablet Take 1 tablet (500 mg total) by mouth 2 (two) times daily.   No facility-administered encounter medications on file as of 09/04/2022.    Past Medical History:  Diagnosis Date   Anemia    Back pain    BV (bacterial vaginosis)    Diabetes (HCC)    Diabetes (HCC)    Ectopic pregnancy    Infection    UTI   No pertinent past medical history    Obesity    Prediabetes    Sleep apnea    Strep throat    Trichomonas     Past Surgical History:  Procedure Laterality Date   SALPINGECTOMY     removal of ectopic preg and tube   TUBAL LIGATION N/A    Phreesia 10/25/2019    Family History  Problem Relation Age of Onset   Hypertension Mother    Thyroid disease Mother    Diabetes Mother    Cancer Mother    Heart disease Father        CHF   Hypertension Father    Sleep apnea Father    Alcoholism Father    Obesity Father     Diabetes Maternal Grandmother    Hypertension Maternal Grandmother    Cancer Maternal Grandmother        breast   Cancer Paternal Grandmother    Anesthesia problems Neg Hx    Hypotension Neg Hx    Malignant hyperthermia Neg Hx    Pseudochol deficiency Neg Hx     Social History   Socioeconomic History   Marital status: Single    Spouse name: Not on file   Number of children: Not on file   Years of education: Not on file   Highest education level: Not on file  Occupational History   Occupation: Statistician   Occupation: special needs at a day program  Tobacco Use   Smoking status: Never   Smokeless tobacco: Never  Vaping Use   Vaping Use: Never used  Substance and Sexual Activity   Alcohol use: No    Comment: occ   Drug use: No   Sexual activity: Yes    Birth control/protection: None    Comment: monogamous rlship wiht female partner of 8 years; last intercourse one month ago  Other Topics Concern   Not on file  Social History  Narrative   Not on file   Social Determinants of Health   Financial Resource Strain: Not on file  Food Insecurity: No Food Insecurity (10/31/2021)   Hunger Vital Sign    Worried About Running Out of Food in the Last Year: Never true    Ran Out of Food in the Last Year: Never true  Transportation Needs: No Transportation Needs (10/31/2021)   PRAPARE - Administrator, Civil Service (Medical): No    Lack of Transportation (Non-Medical): No  Physical Activity: Not on file  Stress: Not on file  Social Connections: Not on file  Intimate Partner Violence: Not on file    Review of Systems  Genitourinary:  Positive for dysuria.  Musculoskeletal:  Positive for back pain.  All other systems reviewed and are negative.       Objective    BP 119/82   Pulse 80   Temp 97.7 F (36.5 C) (Oral)   Resp 16   Wt 273 lb (123.8 kg)   LMP 07/11/2022 (Exact Date) Comment: lasted about 8 days  SpO2 98%   BMI 44.06 kg/m   Physical Exam Vitals  and nursing note reviewed.  Constitutional:      General: She is not in acute distress.    Appearance: She is obese.  Cardiovascular:     Rate and Rhythm: Normal rate and regular rhythm.  Pulmonary:     Effort: Pulmonary effort is normal.     Breath sounds: Normal breath sounds.  Abdominal:     Palpations: Abdomen is soft.     Tenderness: There is no abdominal tenderness.  Musculoskeletal:     Right lower leg: No edema.     Left lower leg: No edema.  Neurological:     General: No focal deficit present.     Mental Status: She is alert and oriented to person, place, and time.         Assessment & Plan:   1. Dysuria Macrobid prescribed - POCT URINALYSIS DIP (CLINITEK)  2. Encounter for weight management Discussed dietary and activity options again in detail. Patient gained weight. Goal is 3-5lbs st loss/mo  3. Class 3 severe obesity without serious comorbidity with body mass index (BMI) of 40.0 to 44.9 in adult, unspecified obesity type (HCC)     Return if symptoms worsen or fail to improve.   Tommie Raymond, MD

## 2022-09-09 ENCOUNTER — Telehealth: Payer: Self-pay | Admitting: *Deleted

## 2022-09-09 ENCOUNTER — Encounter: Payer: Self-pay | Admitting: Family

## 2022-09-09 NOTE — Telephone Encounter (Signed)
Pt given lab results per notes of A. Zonia Kief, NP from 09/06/22 on 09/09/22. Pt verbalized understanding no UTI but requesting why trace amounts blood and leukocytes noted. No worsening sx of back pain reported. Patient would like a call back or My Chart message regarding lab results.

## 2022-09-09 NOTE — Telephone Encounter (Signed)
Please advise the patient.

## 2022-09-11 ENCOUNTER — Encounter: Payer: Self-pay | Admitting: Family Medicine

## 2022-09-13 ENCOUNTER — Encounter: Payer: Self-pay | Admitting: Obstetrics and Gynecology

## 2022-09-13 ENCOUNTER — Encounter: Payer: Self-pay | Admitting: Family Medicine

## 2022-09-13 ENCOUNTER — Ambulatory Visit: Payer: Commercial Managed Care - HMO | Admitting: Obstetrics and Gynecology

## 2022-09-13 ENCOUNTER — Other Ambulatory Visit (HOSPITAL_COMMUNITY)
Admission: RE | Admit: 2022-09-13 | Discharge: 2022-09-13 | Disposition: A | Discharge status: Home / Self Care | Payer: Commercial Managed Care - HMO | Source: Ambulatory Visit | Attending: Obstetrics and Gynecology | Admitting: Obstetrics and Gynecology

## 2022-09-13 ENCOUNTER — Other Ambulatory Visit: Payer: Self-pay

## 2022-09-13 VITALS — BP 113/77 | HR 85 | Ht 66.0 in | Wt 273.0 lb

## 2022-09-13 DIAGNOSIS — N898 Other specified noninflammatory disorders of vagina: Secondary | ICD-10-CM | POA: Insufficient documentation

## 2022-09-13 DIAGNOSIS — M545 Low back pain, unspecified: Secondary | ICD-10-CM

## 2022-09-13 LAB — POCT URINALYSIS DIP (DEVICE)
Bilirubin Urine: NEGATIVE
Glucose, UA: NEGATIVE mg/dL
Ketones, ur: NEGATIVE mg/dL
Leukocytes,Ua: NEGATIVE
Nitrite: NEGATIVE
Protein, ur: NEGATIVE mg/dL
Specific Gravity, Urine: >=1.03 (ref 1.005–1.030)
Urobilinogen, UA: 1 mg/dL (ref 0.0–1.0)
pH: 6 (ref 5.0–8.0)

## 2022-09-13 MED ORDER — CYCLOBENZAPRINE HCL 10 MG PO TABS
10.0000 mg | ORAL_TABLET | Freq: Three times a day (TID) | ORAL | 2 refills | Status: AC | PRN
Start: 2022-09-13 — End: ?

## 2022-09-13 NOTE — Progress Notes (Signed)
  CC: back pain Subjective:    Patient ID: Regina Watkins, female    DOB: 03/14/1985, 37 y.o.   MRN: [id number]  HPI Pt was initially concerned about back and lower abdomen pain, but realized that her menses was starting.  Pt does have left CVA tenderness, but was recently worked up for UTI and culture was negative.    Review of Systems     Objective:   Physical Exam Vitals:   09/13/22 0933  BP: 113/77  Pulse: 85   Left CVA tenderness. Urine dipstick shows positive for RBC's.   Urine culture pending       Assessment & Plan:   1. Vaginal irritation Swab per pt request - Cervicovaginal ancillary only( [place])  2. Acute left-sided low back pain without sciatica Will check urine culture, hold abx unit culture returns, will treat sore back with flexeril for now - Urine Culture - cyclobenzaprine (FLEXERIL) 10 MG tablet; Take 1 tablet (10 mg total) by mouth 3 (three) times daily as needed for muscle spasms.  Dispense: 30 tablet; Refill: 2    Regina Fillers, MD Faculty Attending, Center for [facility]

## 2022-09-15 LAB — URINE CULTURE

## 2022-09-16 ENCOUNTER — Encounter: Payer: Self-pay | Admitting: Lactation Services

## 2022-09-16 ENCOUNTER — Other Ambulatory Visit: Payer: Self-pay | Admitting: Lactation Services

## 2022-09-16 LAB — CERVICOVAGINAL ANCILLARY ONLY
Bacterial Vaginitis (gardnerella): POSITIVE — AB
Candida Glabrata: NEGATIVE
Candida Vaginitis: POSITIVE — AB
Comment: NEGATIVE
Comment: NEGATIVE
Comment: NEGATIVE
Comment: NEGATIVE
Trichomonas: NEGATIVE

## 2022-09-16 MED ORDER — METRONIDAZOLE 500 MG PO TABS: 500.0000 mg | ORAL_TABLET | Freq: Two times a day (BID) | ORAL | 0 refills | Status: DC

## 2022-09-16 MED ORDER — FLUCONAZOLE 150 MG PO TABS: 150.0000 mg | ORAL_TABLET | Freq: Once | ORAL | 0 refills | Status: AC

## 2022-09-18 ENCOUNTER — Ambulatory Visit (INDEPENDENT_AMBULATORY_CARE_PROVIDER_SITE_OTHER): Payer: Commercial Managed Care - HMO | Admitting: Internal Medicine

## 2022-09-18 ENCOUNTER — Telehealth: Payer: Self-pay | Admitting: General Practice

## 2022-09-18 NOTE — Telephone Encounter (Signed)
 Opened in error

## 2022-09-18 NOTE — Telephone Encounter (Signed)
-----   Message from Warden Fillers sent at 09/18/2022  9:34 AM EDT ----- Yeast and BV noted on swab, will offer treatment

## 2022-10-09 ENCOUNTER — Other Ambulatory Visit (HOSPITAL_COMMUNITY)
Admission: RE | Admit: 2022-10-09 | Discharge: 2022-10-09 | Disposition: A | Discharge status: Home / Self Care | Payer: Commercial Managed Care - HMO | Source: Ambulatory Visit | Attending: Family Medicine | Admitting: Family Medicine

## 2022-10-09 ENCOUNTER — Encounter: Payer: Self-pay | Admitting: Obstetrics and Gynecology

## 2022-10-09 ENCOUNTER — Ambulatory Visit (INDEPENDENT_AMBULATORY_CARE_PROVIDER_SITE_OTHER): Payer: Commercial Managed Care - HMO

## 2022-10-09 ENCOUNTER — Other Ambulatory Visit: Payer: Self-pay

## 2022-10-09 VITALS — BP 138/88 | HR 91 | Wt 274.0 lb

## 2022-10-09 DIAGNOSIS — N898 Other specified noninflammatory disorders of vagina: Secondary | ICD-10-CM | POA: Diagnosis not present

## 2022-10-09 DIAGNOSIS — R300 Dysuria: Secondary | ICD-10-CM

## 2022-10-09 LAB — POCT URINALYSIS DIP (DEVICE)
Bilirubin Urine: NEGATIVE
Glucose, UA: NEGATIVE mg/dL
Ketones, ur: NEGATIVE mg/dL
Nitrite: NEGATIVE
Protein, ur: NEGATIVE mg/dL
Specific Gravity, Urine: 1.02 (ref 1.005–1.030)
Urobilinogen, UA: 0.2 mg/dL (ref 0.0–1.0)
pH: 5.5 (ref 5.0–8.0)

## 2022-10-09 MED ORDER — NITROFURANTOIN MONOHYD MACRO 100 MG PO CAPS: 100.0000 mg | ORAL_CAPSULE | Freq: Two times a day (BID) | ORAL | 0 refills | Status: DC

## 2022-10-09 NOTE — Progress Notes (Signed)
Pt reports burning with urination no pain.  Pt states that she wipes she has old blood on the tissue.  UA results moderate blood.  Pt e-prescribed Macrobid 100 mg po bid x 7 day, urine sent for culture, and pt advised that if we need to treat her with something different we will call her.  Pt explained how to obtain self swab as pt questions for BV.  Pt advised that we will call with abnormal results. Pt verbalized understanding with no further questions.   Addison Naegeli, RN  10/09/22

## 2022-10-14 ENCOUNTER — Ambulatory Visit (INDEPENDENT_AMBULATORY_CARE_PROVIDER_SITE_OTHER): Payer: Commercial Managed Care - HMO | Admitting: Internal Medicine

## 2022-11-06 ENCOUNTER — Ambulatory Visit: Payer: Commercial Managed Care - HMO | Admitting: Obstetrics and Gynecology

## 2022-11-13 ENCOUNTER — Ambulatory Visit (INDEPENDENT_AMBULATORY_CARE_PROVIDER_SITE_OTHER): Payer: Commercial Managed Care - HMO | Admitting: Obstetrics and Gynecology

## 2022-11-13 ENCOUNTER — Encounter: Payer: Self-pay | Admitting: Obstetrics and Gynecology

## 2022-11-13 ENCOUNTER — Other Ambulatory Visit (HOSPITAL_COMMUNITY)
Admission: RE | Admit: 2022-11-13 | Discharge: 2022-11-13 | Disposition: A | Discharge status: Home / Self Care | Payer: Commercial Managed Care - HMO | Source: Ambulatory Visit | Attending: Obstetrics and Gynecology | Admitting: Obstetrics and Gynecology

## 2022-11-13 VITALS — BP 144/107 | HR 98 | Wt 271.0 lb

## 2022-11-13 DIAGNOSIS — Z1330 Encounter for screening examination for mental health and behavioral disorders, unspecified: Secondary | ICD-10-CM | POA: Diagnosis not present

## 2022-11-13 DIAGNOSIS — Z113 Encounter for screening for infections with a predominantly sexual mode of transmission: Secondary | ICD-10-CM

## 2022-11-13 DIAGNOSIS — Z01419 Encounter for gynecological examination (general) (routine) without abnormal findings: Secondary | ICD-10-CM | POA: Insufficient documentation

## 2022-11-13 NOTE — Progress Notes (Signed)
ANNUAL EXAM Patient name: Regina Watkins MRN [id number]  Date of birth: 04-Jul-1985 Chief Complaint:   Gynecologic Exam  History of Present Illness:   Regina Watkins is a 37 y.o. G1P0010 being seen today for a routine annual exam.  Current complaints: left sided back pain, spotting  Menstrual concerns? No  takes megace prn Breast or nipple changes? No - breast soreness around time of menses  Contraception use? Yes s/p TL Sexually active? Yes   Patient's last menstrual period was 07/28/2022 (approximate).   The pregnancy intention screening data noted above was reviewed. Potential methods of contraception were discussed. The patient elected to proceed with No data recorded.   Last pap     Component Value Date/Time   DIAGPAP  06/13/2021 1514    - Negative for intraepithelial lesion or malignancy (NILM)   DIAGPAP  04/13/2019 0939    - Negative for intraepithelial lesion or malignancy (NILM)   HPVHIGH Negative 04/13/2019 0939   ADEQPAP  06/13/2021 1514    Satisfactory for evaluation; transformation zone component PRESENT.   ADEQPAP  04/13/2019 0939    Satisfactory for evaluation; transformation zone component ABSENT.   No history of abnormal pap smear   Last mammogram:  Last colonoscopy: n/a.      11/13/2022   10:05 AM 09/13/2022    9:41 AM 09/04/2022    2:26 PM 04/30/2022    9:19 AM 02/04/2022    9:25 AM  Depression screen PHQ 2/9  Decreased Interest 0 0 0 1 0  Down, Depressed, Hopeless 0 0 0 0 0  PHQ - 2 Score 0 0 0 1 0  Altered sleeping 2 1 0 1 2  Tired, decreased energy 1 0 0 0 1  Change in appetite 0 0 0 0 0  Feeling bad or failure about yourself  0 0 0 0 0  Trouble concentrating 0 0 0 0 0  Moving slowly or fidgety/restless 0 0 0 0 0  Suicidal thoughts 0 0 0 0 0  PHQ-9 Score 3 1 0 2 3  Difficult doing work/chores   Not difficult at all Not difficult at all Not difficult at all        11/13/2022   10:06 AM 09/13/2022    9:41 AM 09/04/2022    2:26 PM  04/30/2022    9:19 AM  GAD 7 : Generalized Anxiety Score  Nervous, Anxious, on Edge 0 0 0 1  Control/stop worrying 0 0 0 0  Worry too much - different things 1 1 0 0  Trouble relaxing 1 1 0 1  Restless 0 0 0 0  Easily annoyed or irritable 0 0 0 0  Afraid - awful might happen 0 0 0 0  Total GAD 7 Score 2 2 0 2  Anxiety Difficulty   Not difficult at all Not difficult at all     Review of Systems:   Pertinent items are noted in HPI Denies any headaches, blurred vision, fatigue, shortness of breath, chest pain, abdominal pain, abnormal vaginal discharge/itching/odor/irritation, problems with periods, bowel movements, urination, or intercourse unless otherwise stated above. Pertinent History Reviewed:  Reviewed past medical,surgical, social and family history.  Reviewed problem list, medications and allergies. Physical Assessment:   Vitals:   11/13/22 0957  BP: (!) 144/107  Pulse: 98  Weight: 271 lb (122.9 kg)  Body mass index is 43.74 kg/m.        Physical Examination:   General appearance - well appearing, and in no  distress  Mental status - alert, oriented to person, place, and time  Psych:  She has a normal mood and affect  Skin - warm and dry, normal color, no suspicious lesions noted  Chest - effort normal, all lung fields clear to auscultation bilaterally  Heart - normal rate and regular rhythm  Breasts - breasts appear normal, no suspicious masses, no skin or nipple changes or  axillary nodes  Abdomen - soft, nontender, nondistended, no masses or organomegaly  Pelvic -  VULVA: normal appearing vulva with no masses, tenderness or lesions   VAGINA: normal appearing vagina with normal color and discharge, no lesions   CERVIX: normal appearing cervix without discharge or lesions, no CMT  UTERUS: limited by habitus  ADNEXA: No adnexal masses or tenderness noted.  Extremities:  No swelling or varicosities noted  Chaperone present for exam  No results found for this or  any previous visit (from the past 24 hour(s)).    Assessment & Plan:  1. Well woman exam with routine gynecological exam - Cervical cancer screening: Discussed guidelines. Pap with HPV due 2025 - GC/CT: accepts - Birth Control:  TL - Breast Health: Encouraged self breast awareness/SBE. Teaching provided.  - F/U 12 months and prn  - Cervicovaginal ancillary only  2. Screening examination for STI - Hepatitis B surface antigen - Hepatitis C antibody - RPR - HIV Antibody (routine testing w rflx) - Cervicovaginal ancillary only   Orders Placed This Encounter  Procedures   Hepatitis B surface antigen   Hepatitis C antibody   RPR   HIV Antibody (routine testing w rflx)    Meds: No orders of the defined types were placed in this encounter.   Follow-up: No follow-ups on file.  Lorriane Shire, MD 11/13/2022 10:09 AM

## 2022-11-14 ENCOUNTER — Other Ambulatory Visit: Payer: Self-pay | Admitting: Obstetrics and Gynecology

## 2022-11-14 DIAGNOSIS — B9689 Other specified bacterial agents as the cause of diseases classified elsewhere: Secondary | ICD-10-CM

## 2022-11-14 DIAGNOSIS — B3731 Acute candidiasis of vulva and vagina: Secondary | ICD-10-CM

## 2022-11-14 LAB — CERVICOVAGINAL ANCILLARY ONLY
Bacterial Vaginitis (gardnerella): POSITIVE — AB
Candida Glabrata: NEGATIVE
Candida Vaginitis: POSITIVE — AB
Chlamydia: NEGATIVE
Comment: NEGATIVE
Comment: NEGATIVE
Comment: NEGATIVE
Comment: NEGATIVE
Comment: NEGATIVE
Comment: NORMAL
Neisseria Gonorrhea: NEGATIVE
Trichomonas: NEGATIVE

## 2022-11-14 LAB — RPR: RPR Ser Ql: NONREACTIVE

## 2022-11-14 LAB — HEPATITIS C ANTIBODY: Hep C Virus Ab: NONREACTIVE

## 2022-11-14 LAB — HEPATITIS B SURFACE ANTIGEN: Hepatitis B Surface Ag: NEGATIVE

## 2022-11-14 LAB — HIV ANTIBODY (ROUTINE TESTING W REFLEX): HIV Screen 4th Generation wRfx: NONREACTIVE

## 2022-11-14 MED ORDER — FLUCONAZOLE 150 MG PO TABS
150.0000 mg | ORAL_TABLET | Freq: Once | ORAL | 3 refills | Status: AC
Start: 2022-11-14 — End: 2022-11-14

## 2022-11-14 MED ORDER — METRONIDAZOLE 500 MG PO TABS
500.0000 mg | ORAL_TABLET | Freq: Two times a day (BID) | ORAL | 0 refills | Status: AC
Start: 2022-11-14 — End: 2022-11-21

## 2023-01-07 ENCOUNTER — Other Ambulatory Visit: Payer: Self-pay | Admitting: Obstetrics and Gynecology

## 2023-01-07 DIAGNOSIS — N921 Excessive and frequent menstruation with irregular cycle: Secondary | ICD-10-CM

## 2023-02-11 ENCOUNTER — Other Ambulatory Visit: Payer: Self-pay

## 2023-02-11 ENCOUNTER — Ambulatory Visit: Payer: Commercial Managed Care - HMO

## 2023-02-11 ENCOUNTER — Other Ambulatory Visit (HOSPITAL_COMMUNITY)
Admission: RE | Admit: 2023-02-11 | Discharge: 2023-02-11 | Disposition: A | Discharge status: Home / Self Care | Payer: Commercial Managed Care - HMO | Source: Ambulatory Visit | Attending: Family Medicine | Admitting: Family Medicine

## 2023-02-11 VITALS — BP 135/89 | HR 76 | Ht 66.0 in | Wt 281.2 lb

## 2023-02-11 DIAGNOSIS — N760 Acute vaginitis: Secondary | ICD-10-CM | POA: Diagnosis not present

## 2023-02-11 DIAGNOSIS — B3731 Acute candidiasis of vulva and vagina: Secondary | ICD-10-CM | POA: Diagnosis present

## 2023-02-11 DIAGNOSIS — B9689 Other specified bacterial agents as the cause of diseases classified elsewhere: Secondary | ICD-10-CM | POA: Insufficient documentation

## 2023-02-11 NOTE — Progress Notes (Signed)
Regina Watkins is here with concern of yeast infection--vaginal itching, pain with sex, and yellowish discharge. These symptoms have been present for 2 weeks. Patient reports she has tried nothing to resolve symptoms.  Pertinent history: recurrent yeast infections   Plan of care:   Self swab instructions given and specimen obtained--per patient request, tested for BV, yeast, GCCT, and trich. Explained patient will be contacted with any abnormal results. Patient is not due for annual exam. (Last annual done on 11/13/22).   Patient encouraged to schedule appointment with provider to discuss recurrent yeast infections and finding a plan of care to stop periods as desired by patient. Appointment with Cathren Harsh, MD scheduled for 03/13/23.   Meryl Crutch, RN 02/11/2023  10:21 AM

## 2023-02-12 LAB — CERVICOVAGINAL ANCILLARY ONLY
Bacterial Vaginitis (gardnerella): POSITIVE — AB
Candida Glabrata: NEGATIVE
Candida Vaginitis: POSITIVE — AB
Chlamydia: NEGATIVE
Comment: NEGATIVE
Comment: NEGATIVE
Comment: NEGATIVE
Comment: NEGATIVE
Comment: NEGATIVE
Comment: NORMAL
Neisseria Gonorrhea: NEGATIVE
Trichomonas: NEGATIVE

## 2023-02-13 ENCOUNTER — Other Ambulatory Visit: Payer: Self-pay | Admitting: Obstetrics and Gynecology

## 2023-02-13 ENCOUNTER — Telehealth: Payer: Self-pay | Admitting: Family Medicine

## 2023-02-13 DIAGNOSIS — B3731 Acute candidiasis of vulva and vagina: Secondary | ICD-10-CM

## 2023-02-13 DIAGNOSIS — N760 Acute vaginitis: Secondary | ICD-10-CM

## 2023-02-13 MED ORDER — FLUCONAZOLE 150 MG PO TABS: 150.0000 mg | ORAL_TABLET | Freq: Once | ORAL | 1 refills | Status: AC

## 2023-02-13 MED ORDER — METRONIDAZOLE 500 MG PO TABS: 500.0000 mg | ORAL_TABLET | Freq: Two times a day (BID) | ORAL | 0 refills | Status: AC

## 2023-02-13 NOTE — Telephone Encounter (Signed)
Patient called about last visit. She says the nurse was going to prescribe something for her yeast infection once she received her results. She is just following up because nothing has been sent to the pharmacy as of yet.

## 2023-02-14 ENCOUNTER — Encounter: Payer: Self-pay | Admitting: General Practice

## 2023-02-17 NOTE — Telephone Encounter (Signed)
Attempt to reach patient to verify if she as able to pick up her Rx for Yeast treatment. No answer and LVM for a call back.    Felecia Shelling [facility] 02/17/2023

## 2023-02-24 ENCOUNTER — Other Ambulatory Visit: Payer: Self-pay | Admitting: Obstetrics and Gynecology

## 2023-02-24 DIAGNOSIS — N921 Excessive and frequent menstruation with irregular cycle: Secondary | ICD-10-CM

## 2023-03-13 ENCOUNTER — Other Ambulatory Visit: Payer: Self-pay

## 2023-03-13 ENCOUNTER — Encounter: Payer: Self-pay | Admitting: Obstetrics and Gynecology

## 2023-03-13 ENCOUNTER — Other Ambulatory Visit (HOSPITAL_COMMUNITY)
Admission: RE | Admit: 2023-03-13 | Discharge: 2023-03-13 | Disposition: A | Discharge status: Home / Self Care | Payer: No Typology Code available for payment source | Source: Ambulatory Visit | Attending: Obstetrics and Gynecology | Admitting: Obstetrics and Gynecology

## 2023-03-13 ENCOUNTER — Ambulatory Visit: Payer: No Typology Code available for payment source | Admitting: Obstetrics and Gynecology

## 2023-03-13 VITALS — BP 131/84 | HR 102 | Wt 274.0 lb

## 2023-03-13 DIAGNOSIS — Z1331 Encounter for screening for depression: Secondary | ICD-10-CM | POA: Diagnosis not present

## 2023-03-13 DIAGNOSIS — R350 Frequency of micturition: Secondary | ICD-10-CM | POA: Diagnosis not present

## 2023-03-13 DIAGNOSIS — N921 Excessive and frequent menstruation with irregular cycle: Secondary | ICD-10-CM

## 2023-03-13 DIAGNOSIS — N760 Acute vaginitis: Secondary | ICD-10-CM

## 2023-03-13 DIAGNOSIS — R8290 Unspecified abnormal findings in urine: Secondary | ICD-10-CM

## 2023-03-13 LAB — CBC
Hematocrit: 44.1 % (ref 34.0–46.6)
Hemoglobin: 14 g/dL (ref 11.1–15.9)
MCH: 27.5 pg (ref 26.6–33.0)
MCHC: 31.7 g/dL (ref 31.5–35.7)
MCV: 87 fL (ref 79–97)
Platelets: 287 10*3/uL (ref 150–450)
RBC: 5.1 x10E6/uL (ref 3.77–5.28)
RDW: 12.8 % (ref 11.7–15.4)
WBC: 6.4 10*3/uL (ref 3.4–10.8)

## 2023-03-13 LAB — POCT URINALYSIS DIP (DEVICE)
Bilirubin Urine: NEGATIVE
Glucose, UA: >=1000 mg/dL — AB
Ketones, ur: NEGATIVE mg/dL
Nitrite: NEGATIVE
Protein, ur: NEGATIVE mg/dL
Specific Gravity, Urine: 1.01 (ref 1.005–1.030)
Urobilinogen, UA: 0.2 mg/dL (ref 0.0–1.0)
pH: 5.5 (ref 5.0–8.0)

## 2023-03-13 LAB — HEMOGLOBIN A1C
Est. average glucose Bld gHb Est-mCnc: 258 mg/dL
Hgb A1c MFr Bld: 10.6 % — ABNORMAL HIGH (ref 4.8–5.6)

## 2023-03-13 MED ORDER — NORETHINDRONE ACETATE 5 MG PO TABS: 5.0000 mg | ORAL_TABLET | Freq: Every day | ORAL | 6 refills | Status: DC

## 2023-03-13 NOTE — Progress Notes (Addendum)
 GYNECOLOGY VISIT  Patient name: Regina Watkins MRN [id number]  Date of birth: 04-21-1985 Chief Complaint:   Gynecologic Exam   History:  Regina Watkins is a 38 y.o. G1P0010 being seen today for AUB and recurrent yeast infections. .    Recurrent yeast infectinos and bleedin gonccnerns. Until now ha dnever had issues with yeast infections and now keeps getting git. Has changed soaps and washing powder. Started happenign last yeart - though ti was related ot medication fo rtooth which she has completed. Has DM and not takign medication for it. Wondering if there is a probiotic recommended. Does not want IUD due to hearing about 'problems' with it. Has considered a hysterectomy but previously told no. Open to the idea of a pregnancy and has not sure she is completely done with childbearing. Prior unilateral salpingectomy.   Has cocnerns rgardin gher bleeidng and what she can take to stop her bleeding altogether. If she misses a day she will have bleeding/spotting. Menses will prolonged; starts regularly but will have a menses that lasts 2-3 weeks unless she takes the megace . Friend recommend she ask about medication option: norethindrone  acetate-estradiol (lo estrin) .   Stopped DM/weight loss meds as she felt it was not making a difference. She initially had improvement of A1c when initially started on medications but has not had follow up since stopping medication 2 months ago.   Past Medical History:  Diagnosis Date   Anemia    Back pain    BV (bacterial vaginosis)    Diabetes (HCC)    Diabetes (HCC)    Ectopic pregnancy    Infection    UTI   No pertinent past medical history    Obesity    Prediabetes    Sleep apnea    Strep throat    Trichomonas     Past Surgical History:  Procedure Laterality Date   SALPINGECTOMY     removal of ectopic preg and tube   TUBAL LIGATION N/A    Phreesia 10/25/2019    The following portions of the patient's history were reviewed and  updated as appropriate: allergies, current medications, past family history, past medical history, past social history, past surgical history and problem list.   Health Maintenance:   Last pap     Component Value Date/Time   DIAGPAP  06/13/2021 1514    - Negative for intraepithelial lesion or malignancy (NILM)   DIAGPAP  04/13/2019 0939    - Negative for intraepithelial lesion or malignancy (NILM)   HPVHIGH Negative 04/13/2019 0939   ADEQPAP  06/13/2021 1514    Satisfactory for evaluation; transformation zone component PRESENT.   ADEQPAP  04/13/2019 0939    Satisfactory for evaluation; transformation zone component ABSENT.    High Risk HPV: Positive  Adequacy:  Satisfactory for evaluation, transformation zone component PRESENT  Diagnosis:  Atypical squamous cells of undetermined significance (ASC-US )  Last mammogram: n/a   Review of Systems:  Pertinent items are noted in HPI. Comprehensive review of systems was otherwise negative.   Objective:  Physical Exam BP 131/84   Pulse (!) 102   Wt 274 lb (124.3 kg)   LMP 03/10/2023 (Approximate)   BMI 44.22 kg/m    Physical Exam Vitals and nursing note reviewed.  Constitutional:      Appearance: Normal appearance.  HENT:     Head: Normocephalic and atraumatic.  Pulmonary:     Effort: Pulmonary effort is normal.  Neurological:     General: No focal  deficit present.     Mental Status: She is alert.  Psychiatric:        Mood and Affect: Mood normal.        Behavior: Behavior normal.        Thought Content: Thought content normal.        Judgment: Judgment normal.    UA with significant glucosuria     Assessment & Plan:   1. Menorrhagia with irregular cycle (Primary) Reviewed possible contribution of abnormal TSH or A1c causing abnormal bleeding, will check labs today as well as peripheral conversion due to excess weight causing prolonged menses. Recommend follow up with PCP/weight management regarding weight. Noted  that megace  can cause difficulties with glucose control, will switch to aygestin  at this time. Offered TXA as well, was feeling unsure of how to proceed, so will start with aygestin  and if not helping, will trial TXA. Noted that endometrial ablation and hysterectomy not advised if she is not sure she has completed childbearing.   - Hemoglobin A1c - TSH Rfx on Abnormal to Free T4 - CBC - norethindrone  (AYGESTIN ) 5 MG tablet; Take 1 tablet (5 mg total) by mouth daily.  Dispense: 30 tablet; Refill: 6 - Cervicovaginal ancillary only  2. Recurrent vaginitis Recent increase in yeast infections with no prior history of yeast infections. Self swab completed today. Assess current glucose status as persistent hyperglycemia can be implicated in recurrent yeast infections.   3. Urinary frequency UA completed - note significant glucosuria, will send for culture - POCT urinalysis dip (device)   Routine preventative health maintenance measures emphasized.  Carter Quarry, MD Minimally Invasive Gynecologic Surgery Center for [facility], [facility]

## 2023-03-13 NOTE — Patient Instructions (Addendum)
 Going to start with aygestin  (norethindrone  acetate) to help control your bleeding.   If you feel this is not helping, we can either increase the dose or try a non-hormonal medication called Lysteda (tranexamic acid), which is only taken during the actual period to slow it down.   If you decide you want an endometrial ablation or hysterectomy, you need to be sure you do not want to get pregnant in the future.

## 2023-03-13 NOTE — Addendum Note (Signed)
 Addended by: Brien Mates T on: 03/13/2023 10:21 AM   Modules accepted: Orders

## 2023-03-14 ENCOUNTER — Encounter: Payer: Self-pay | Admitting: Obstetrics and Gynecology

## 2023-03-14 ENCOUNTER — Other Ambulatory Visit: Payer: Self-pay | Admitting: Obstetrics and Gynecology

## 2023-03-14 DIAGNOSIS — B3731 Acute candidiasis of vulva and vagina: Secondary | ICD-10-CM

## 2023-03-14 DIAGNOSIS — N760 Acute vaginitis: Secondary | ICD-10-CM

## 2023-03-14 LAB — CERVICOVAGINAL ANCILLARY ONLY
Bacterial Vaginitis (gardnerella): NEGATIVE
Candida Glabrata: NEGATIVE
Candida Vaginitis: POSITIVE — AB
Chlamydia: NEGATIVE
Comment: NEGATIVE
Comment: NEGATIVE
Comment: NEGATIVE
Comment: NEGATIVE
Comment: NEGATIVE
Comment: NORMAL
Neisseria Gonorrhea: NEGATIVE
Trichomonas: NEGATIVE

## 2023-03-14 LAB — TSH RFX ON ABNORMAL TO FREE T4: TSH: 1.7 u[IU]/mL (ref 0.450–4.500)

## 2023-03-14 MED ORDER — TERCONAZOLE 0.4 % VA CREA: 1.0000 | TOPICAL_CREAM | Freq: Every day | VAGINAL | 1 refills | Status: AC

## 2023-03-16 LAB — URINE CULTURE

## 2023-03-18 ENCOUNTER — Telehealth: Payer: Self-pay | Admitting: Family Medicine

## 2023-03-18 NOTE — Telephone Encounter (Signed)
 Pt is calling in because she wants to know if Dr. Andrey Campanile can send her in a prescription for a glucose monitoring kit. PT says she needs the entire kit including the test strips. Pt is requesting a call when it has been sent

## 2023-03-19 ENCOUNTER — Encounter: Payer: No Typology Code available for payment source | Admitting: Family

## 2023-03-19 ENCOUNTER — Other Ambulatory Visit: Payer: Self-pay | Admitting: Family Medicine

## 2023-03-19 ENCOUNTER — Other Ambulatory Visit: Payer: Self-pay | Admitting: *Deleted

## 2023-03-19 ENCOUNTER — Ambulatory Visit: Payer: Self-pay

## 2023-03-19 DIAGNOSIS — E1169 Type 2 diabetes mellitus with other specified complication: Secondary | ICD-10-CM

## 2023-03-19 MED ORDER — LANCET DEVICE MISC: 1.0000 | Freq: Three times a day (TID) | 0 refills | Status: AC

## 2023-03-19 MED ORDER — LANCETS MISC. MISC: 1.0000 | Freq: Three times a day (TID) | 0 refills | Status: AC

## 2023-03-19 MED ORDER — BLOOD GLUCOSE MONITORING SUPPL DEVI: 1.0000 | Freq: Three times a day (TID) | 0 refills | Status: AC

## 2023-03-19 MED ORDER — BLOOD GLUCOSE TEST VI STRP: 1.0000 | ORAL_STRIP | Freq: Three times a day (TID) | 0 refills | Status: AC

## 2023-03-19 NOTE — Telephone Encounter (Signed)
 Has been ordered

## 2023-03-19 NOTE — Telephone Encounter (Signed)
  Chief Complaint: hyperglycemia  Symptoms: BS > 300, dizziness, increased HR, feeling weird  Frequency: several days  Pertinent Negatives: NA Disposition: [] ED /[] Urgent Care (no appt availability in office) / [x] Appointment(In office/virtual)/ []  [place] [facility]/ [] Home Care/ [] Refused Recommended Disposition /[] [place] [facility]/ []  Follow-up with PCP Additional Notes: pt states she stopped taking metformin  a while ago, tested CBG the other day with grandfather monitor and CBG was 306. Pt states her A1C is over 10 now and unsure with CBG being that high is that why she is feeling off. She restarted metformin  yesterday and CBG came down to 200 something but pt hasn't taken today. Messaged Laquanda, FC on Teams and got pt appt for tomorrow at 1020. Pt will come in and be seen for sx. Advised her that CBG supplies and device was sent in to pharmacy today by provider. Pt will go pick up as well.   Summary: dizzy, weird feeling   Patient stated she has be experiencing her sugars number going as high as 300, she has been dizzy heart rate is up and she has been feeling really weird. Please f/u with patient         Reason for Disposition  [1] Symptoms of high blood sugar (e.g., abnormally thirsty, frequent urination, weight loss) AND [2] not able to test blood glucose  Answer Assessment - Initial Assessment Questions 1. BLOOD GLUCOSE: "What is your blood glucose level?"      305 2. ONSET: "When did you check the blood glucose?"     Has been feeling weird for several days  5. TYPE 1 or 2:  "Do you know what type of diabetes you have?"  (e.g., Type 1, Type 2, Gestational; doesn't know)      DM 2  6. INSULIN : "Do you take insulin ?" "What type of insulin (s) do you use? What is the mode of delivery? (syringe, pen; injection or pump)?"      no 7. DIABETES PILLS: "Do you take any pills for your diabetes?" If Yes, ask: "Have you missed taking any pills recently?"     Metformin   8. OTHER  SYMPTOMS: "Do you have any symptoms?" (e.g., fever, frequent urination, difficulty breathing, dizziness, weakness, vomiting)     Dizziness  Protocols used: Diabetes - High Blood Sugar-A-AH

## 2023-03-19 NOTE — Telephone Encounter (Signed)
 Medication Refill -  Most Recent Primary Care Visit:  Provider: Abraham Abo  Department: [facility]  Visit Type: OFFICE VISIT  Date: 09/04/2022  Medication: metFORMIN  (GLUCOPHAGE -XR) 750 MG 24 hr tablet   Has the patient contacted their pharmacy? No  Is this the correct pharmacy for this prescription? Yes  This is the patient's preferred pharmacy:  [id number] - [place] - [address] [place] ([place])  [address] Phone: [phone] Fax: [phone]   Has the prescription been filled recently? Yes  Is the patient out of the medication? Yes  Has the patient been seen for an appointment in the last year OR does the patient have an upcoming appointment? Yes  Can we respond through MyChart? No  Agent: Please be advised that Rx refills may take up to 3 business days. We ask that you follow-up with your pharmacy.

## 2023-03-19 NOTE — Progress Notes (Signed)
 Erroneous encounter-disregard

## 2023-03-19 NOTE — Progress Notes (Signed)
 DM monitor has been ordered

## 2023-03-20 ENCOUNTER — Ambulatory Visit (INDEPENDENT_AMBULATORY_CARE_PROVIDER_SITE_OTHER): Payer: No Typology Code available for payment source | Admitting: Family

## 2023-03-20 ENCOUNTER — Encounter: Payer: Self-pay | Admitting: Family

## 2023-03-20 ENCOUNTER — Encounter: Payer: Self-pay | Admitting: Family Medicine

## 2023-03-20 ENCOUNTER — Other Ambulatory Visit: Payer: Self-pay | Admitting: Family

## 2023-03-20 VITALS — BP 125/84 | HR 89 | Temp 98.2°F | Ht 66.0 in | Wt 265.4 lb

## 2023-03-20 DIAGNOSIS — E669 Obesity, unspecified: Secondary | ICD-10-CM | POA: Diagnosis not present

## 2023-03-20 DIAGNOSIS — G43909 Migraine, unspecified, not intractable, without status migrainosus: Secondary | ICD-10-CM | POA: Diagnosis not present

## 2023-03-20 DIAGNOSIS — E66813 Obesity, class 3: Secondary | ICD-10-CM

## 2023-03-20 DIAGNOSIS — E1165 Type 2 diabetes mellitus with hyperglycemia: Secondary | ICD-10-CM

## 2023-03-20 DIAGNOSIS — Z7984 Long term (current) use of oral hypoglycemic drugs: Secondary | ICD-10-CM

## 2023-03-20 DIAGNOSIS — R319 Hematuria, unspecified: Secondary | ICD-10-CM | POA: Diagnosis not present

## 2023-03-20 DIAGNOSIS — E119 Type 2 diabetes mellitus without complications: Secondary | ICD-10-CM

## 2023-03-20 DIAGNOSIS — Z6841 Body Mass Index (BMI) 40.0 and over, adult: Secondary | ICD-10-CM

## 2023-03-20 LAB — GLUCOSE, POCT (MANUAL RESULT ENTRY): POC Glucose: 222 mg/dL — AB (ref 70–99)

## 2023-03-20 MED ORDER — METFORMIN HCL ER 750 MG PO TB24: 750.0000 mg | ORAL_TABLET | Freq: Two times a day (BID) | ORAL | 0 refills | Status: DC

## 2023-03-20 MED ORDER — SUMATRIPTAN SUCCINATE 25 MG PO TABS: ORAL_TABLET | ORAL | 1 refills | Status: AC

## 2023-03-20 NOTE — Progress Notes (Signed)
Patient states she wants to know why her CBG is up so high.  States lower back pain, headaches, blurred vision.

## 2023-03-20 NOTE — Telephone Encounter (Signed)
Requested medication (s) are due for refill today:   Yes  Requested medication (s) are on the active medication list:   Yes  Future visit scheduled:   Yes for today at 10:20 with Dr. Andrey Campanile     See triage note from 03/19/2023     LOV 09/04/2022.   Last ordered: This was last ordered by another provider at [facility] to refill because labs are due, med. Last ordered by another provider, and OV due  (Appt today)  Requested Prescriptions  Pending Prescriptions Disp Refills   metFORMIN (GLUCOPHAGE-XR) 750 MG 24 hr tablet 180 tablet 0    Sig: Take 1 tablet (750 mg total) by mouth 2 (two) times daily with a meal.     Endocrinology:  Diabetes - Biguanides Failed - 03/20/2023  9:09 AM      Failed - Cr in normal range and within 360 days    Creatinine, Ser  Date Value Ref Range Status  02/06/2022 0.84 0.57 - 1.00 mg/dL Final         Failed - HBA1C is between 0 and 7.9 and within 180 days    Hgb A1c MFr Bld  Date Value Ref Range Status  03/13/2023 10.6 (H) 4.8 - 5.6 % Final    Comment:             Prediabetes: 5.7 - 6.4          Diabetes: >6.4          Glycemic control for adults with diabetes: <7.0          Failed - eGFR in normal range and within 360 days    GFR calc Af Amer  Date Value Ref Range Status  10/29/2019 110 >59 mL/min/1.73 Final    Comment:    **[facility] currently reports eGFR in compliance with the current**   recommendations of the [employer]. [facility] will   update reporting as new guidelines are published from the NKF-ASN   Task force.    GFR, Estimated  Date Value Ref Range Status  10/11/2021 >60 >60 mL/min Final    Comment:    (NOTE) Calculated using the CKD-EPI Creatinine Equation (2021)    eGFR  Date Value Ref Range Status  02/06/2022 92 >59 mL/min/1.73 Final         Failed - Valid encounter within last 6 months    Recent Outpatient Visits           6 months ago Dysuria   [facility], Lauris Poag, MD   10 months ago Type 2 diabetes mellitus with other specified complication, without long-term current use of insulin (HCC)   [facility], MD   1 year ago Encounter for weight management   [facility], Lauris Poag, MD   1 year ago Type 2 diabetes mellitus with other specified complication, without long-term current use of insulin [facility])   [place] [place] [facility], MD   1 year ago Annual physical exam   [place] [facility], MD       Future Appointments             Today Rema Fendt, NP [facility]            Failed - CBC within  normal limits and completed in the last 12 months    WBC  Date Value Ref Range Status  03/13/2023 6.4 3.4 - 10.8 x10E3/uL Final  10/11/2021 8.6 4.0 - 10.5 K/uL Final   RBC  Date Value Ref Range Status  03/13/2023 5.10 3.77 - 5.28 x10E6/uL Final  10/11/2021 4.68 3.87 - 5.11 MIL/uL Final   Hemoglobin  Date Value Ref Range Status  03/13/2023 14.0 11.1 - 15.9 g/dL Final   Hematocrit  Date Value Ref Range Status  03/13/2023 44.1 34.0 - 46.6 % Final   MCHC  Date Value Ref Range Status  03/13/2023 31.7 31.5 - 35.7 g/dL Final  57/84/6962 95.2 30.0 - 36.0 g/dL Final   [facility]  Date Value Ref Range Status  03/13/2023 27.5 26.6 - 33.0 pg Final  10/11/2021 27.4 26.0 - 34.0 pg Final   MCV  Date Value Ref Range Status  03/13/2023 87 79 - 97 fL Final   No results found for: "PLTCOUNTKUC", "LABPLAT", "POCPLA" RDW  Date Value Ref Range Status  03/13/2023 12.8 11.7 - 15.4 % Final         Passed - B12 Level in normal range and within 720 days    Vitamin B-12  Date Value Ref Range Status  02/06/2022 355 232 - 1,245 pg/mL Final

## 2023-03-20 NOTE — Progress Notes (Signed)
Patient ID: [name], female    DOB: December 14, 1985  MRN: [id number]  CC: Chronic Conditions Follow-Up  Subjective: Regina Watkins is a 38 y.o. female who presents for chronic conditions follow-up.  Her concerns today include:  - States she has not taken Metformin XR in 6 months. States she recently began taking Metformin XR again (once daily). Blurry vision. Blood sugars 200's - 300's at home. She is trying to watch what she eats. Denies red flag symptoms associated with diabetes.  - Intermittent headaches. Denies red flag symptoms. Taking over-the-counter medications with mild relief. - States blood in urine when she goes to Gynecology. States she was told Gynecology would watch this but has not been recommended for follow-up.   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 May substitute to any manufacturer covered by patient's insurance. 1 each 0   fluconazole (DIFLUCAN) 150 MG tablet Take by mouth.     Glucose Blood (BLOOD GLUCOSE TEST STRIPS) STRP 1 each by In Vitro route in the morning, at noon, and at bedtime. May substitute to any manufacturer covered by patient's insurance. 100 strip 0   Lancet Device MISC 1 each by Does not apply route in  the morning, at noon, and at bedtime. May substitute to any manufacturer covered by patient's insurance. 1 each 0   Lancets Misc. MISC 1 each by Does not apply route in the morning, at noon, and at bedtime. May substitute to any manufacturer covered by patient's insurance. 100 each 0   norethindrone (AYGESTIN) 5 MG tablet Take 1 tablet (5 mg total) by mouth daily. 30 tablet 6   terconazole (TERAZOL 7) 0.4 % vaginal cream Place 1 applicator vaginally at bedtime. Use for seven days 45 g 1   cyclobenzaprine (FLEXERIL) 10 MG tablet Take 1 tablet (10 mg total) by mouth 3 (three) times daily as needed for muscle spasms. (Patient not taking: Reported on 02/11/2023) 30 tablet 2   No current facility-administered medications on file prior to visit.    Allergies  Allergen Reactions   Latex Other (See Comments)    Latex condoms cause bacterial infections and irritation.    Social History   Socioeconomic History   Marital status: Single    Spouse name: Not on file   Number of children: Not on file   Years of education: Not on file   Highest education level: Not on file  Occupational History   Occupation: [employer]   Occupation: special needs at a day program  Tobacco Use   Smoking status: Never   Smokeless tobacco: Never  Vaping Use   Vaping status: Never Used  Substance and Sexual Activity   Alcohol use: No    Comment: occ   Drug use: No  Sexual activity: Yes    Birth control/protection: None    Comment: monogamous rlship wiht female partner of 8 years; last intercourse one month ago  Other Topics Concern   Not on file  Social History Narrative   Not on file   Social Drivers of Health   Financial Resource Strain: Not on file  Food Insecurity: No Food Insecurity (11/13/2022)   Hunger Vital Sign    Worried About Running Out of Food in the Last Year: Never true    Ran Out of Food in the Last Year: Never true  Transportation Needs: No Transportation Needs (11/13/2022)   PRAPARE -  Administrator, Civil Service (Medical): No    Lack of Transportation (Non-Medical): No  Physical Activity: Not on file  Stress: Not on file  Social Connections: Not on file  Intimate Partner Violence: Not on file    Family History  Problem Relation Age of Onset   Hypertension Mother    Thyroid disease Mother    Diabetes Mother    Cancer Mother    Heart disease Father        CHF   Hypertension Father    Sleep apnea Father    Alcoholism Father    Obesity Father    Diabetes Maternal Grandmother    Hypertension Maternal Grandmother    Cancer Maternal Grandmother        breast   Cancer Paternal Grandmother    Anesthesia problems Neg Hx    Hypotension Neg Hx    Malignant hyperthermia Neg Hx    Pseudochol deficiency Neg Hx     Past Surgical History:  Procedure Laterality Date   SALPINGECTOMY     removal of ectopic preg and tube   TUBAL LIGATION N/A    Phreesia 10/25/2019    ROS: Review of Systems Negative except as stated above  PHYSICAL EXAM: BP 125/84   Pulse 89   Temp 98.2 F (36.8 C) (Oral)   Ht 5\' 6"  (1.676 m)   Wt 265 lb 6.4 oz (120.4 kg)   LMP 03/10/2023 (Approximate)   SpO2 97%   BMI 42.84 kg/m   Physical Exam HENT:     Head: Normocephalic and atraumatic.     Nose: Nose normal.     Mouth/Throat:     Mouth: Mucous membranes are moist.     Pharynx: Oropharynx is clear.  Eyes:     Extraocular Movements: Extraocular movements intact.     Conjunctiva/sclera: Conjunctivae normal.     Pupils: Pupils are equal, round, and reactive to light.  Cardiovascular:     Rate and Rhythm: Normal rate and regular rhythm.     Pulses: Normal pulses.     Heart sounds: Normal heart sounds.  Pulmonary:     Effort: Pulmonary effort is normal.     Breath sounds: Normal breath sounds.  Musculoskeletal:        General: Normal range of motion.     Cervical back: Normal range of motion and neck supple.  Neurological:     General: No focal deficit present.      Mental Status: She is alert and oriented to person, place, and time.  Psychiatric:        Mood and Affect: Mood normal.        Behavior: Behavior normal.     ASSESSMENT AND PLAN: 1. Type 2 diabetes mellitus with hyperglycemia, without long-term current use of insulin (HCC) (Primary) - Hemoglobin A1c 10.6% on 03/13/2023.  - Increase  Metformin XR from 750 mg once daily to 750 mg twice daily. Counseled on medication adherence/adverse effects. - Patient declined to begin insulin therapy.  - Routine screening.  - Discussed the importance of healthy eating habits, low-carbohydrate diet, low-sugar diet, regular aerobic exercise (at least 150 minutes a week as tolerated) and medication compliance to achieve or maintain control of diabetes. - Referral to Medical Nutrition Therapy for evaluation/management. - Follow-up with primary provider in 4 weeks or sooner if needed.  - POCT glucose (manual entry) - metFORMIN (GLUCOPHAGE-XR) 750 MG 24 hr tablet; Take 1 tablet (750 mg total) by mouth 2 (two) times daily with a meal.  Dispense: 180 tablet; Refill: 0 - Microalbumin / creatinine urine ratio - Basic Metabolic Panel - Amb ref to Medical Nutrition Therapy-MNT  2. Diabetic eye exam [facility]) - Referral to Ophthalmology for evaluation/management. - Ambulatory referral to Ophthalmology  3. Encounter for diabetic foot exam [facility]) - Referral to Podiatry for evaluation/management. - Ambulatory referral to Podiatry  4. Migraine without status migrainosus, not intractable, unspecified migraine type - Sumatriptan as prescribed. Counseled on medication adherence/adverse effects.  - Follow-up with primary provider in 4 weeks or sooner if needed.  - SUMAtriptan (IMITREX) 25 MG tablet; Take 25 mg (1 tablet total) by mouth at the start of the headache. May repeat in 2 hours x 1 if headache persists. Max of 2 tablets/24 hours.  Dispense: 30 tablet; Refill: 1  5. Hematuria, unspecified type - Referral to  Urology for evaluation/management. - Ambulatory referral to Urology   Patient was given the opportunity to ask questions.  Patient verbalized understanding of the plan and was able to repeat key elements of the plan. Patient was given clear instructions to go to Emergency Department or return to medical center if symptoms don't improve, worsen, or new problems develop.The patient verbalized understanding.   Orders Placed This Encounter  Procedures   Microalbumin / creatinine urine ratio   Basic Metabolic Panel   Ambulatory referral to Podiatry   Ambulatory referral to Ophthalmology   Amb ref to Medical Nutrition Therapy-MNT   Ambulatory referral to Urology   POCT glucose (manual entry)     Requested Prescriptions   Signed Prescriptions Disp Refills   metFORMIN (GLUCOPHAGE-XR) 750 MG 24 hr tablet 180 tablet 0    Sig: Take 1 tablet (750 mg total) by mouth 2 (two) times daily with a meal.   SUMAtriptan (IMITREX) 25 MG tablet 30 tablet 1    Sig: Take 25 mg (1 tablet total) by mouth at the start of the headache. May repeat in 2 hours x 1 if headache persists. Max of 2 tablets/24 hours.    Return in about 1 week (around 03/27/2023) for Follow-Up or next available with Georganna Skeans, MD .  Rema Fendt, NP

## 2023-03-21 LAB — BASIC METABOLIC PANEL
BUN/Creatinine Ratio: 14 (ref 9–23)
BUN: 10 mg/dL (ref 6–20)
CO2: 23 mmol/L (ref 20–29)
Calcium: 9.8 mg/dL (ref 8.7–10.2)
Chloride: 103 mmol/L (ref 96–106)
Creatinine, Ser: 0.7 mg/dL (ref 0.57–1.00)
Glucose: 193 mg/dL — ABNORMAL HIGH (ref 70–99)
Potassium: 4.1 mmol/L (ref 3.5–5.2)
Sodium: 139 mmol/L (ref 134–144)
eGFR: 114 mL/min/{1.73_m2} (ref >59)

## 2023-03-24 LAB — MICROALBUMIN / CREATININE URINE RATIO
Creatinine, Urine: 156.1 mg/dL
Microalb/Creat Ratio: 20 mg/g{creat} (ref 0–29)
Microalbumin, Urine: 30.9 ug/mL

## 2023-03-25 ENCOUNTER — Encounter: Payer: Self-pay | Admitting: Family

## 2023-03-27 ENCOUNTER — Encounter: Payer: Self-pay | Admitting: Family Medicine

## 2023-03-27 ENCOUNTER — Ambulatory Visit (INDEPENDENT_AMBULATORY_CARE_PROVIDER_SITE_OTHER): Payer: No Typology Code available for payment source | Admitting: Family Medicine

## 2023-03-27 VITALS — BP 116/81 | HR 88 | Temp 98.6°F | Resp 18 | Ht 66.0 in | Wt 272.6 lb

## 2023-03-27 DIAGNOSIS — E66813 Obesity, class 3: Secondary | ICD-10-CM

## 2023-03-27 DIAGNOSIS — E1165 Type 2 diabetes mellitus with hyperglycemia: Secondary | ICD-10-CM

## 2023-03-27 DIAGNOSIS — Z6841 Body Mass Index (BMI) 40.0 and over, adult: Secondary | ICD-10-CM

## 2023-03-27 DIAGNOSIS — Z7984 Long term (current) use of oral hypoglycemic drugs: Secondary | ICD-10-CM

## 2023-03-28 ENCOUNTER — Encounter: Payer: Self-pay | Admitting: Family Medicine

## 2023-03-28 NOTE — Progress Notes (Signed)
Established Patient Office Visit  Subjective    Patient ID: Regina Watkins, female    DOB: 09/06/1985  Age: 38 y.o. MRN: [id number]  CC:  Chief Complaint  Patient presents with   Follow-up    1 week    HPI [name] T Alewine presents for follow up of diabetes. Patient has some questions on how to proceed with her recently elevated A1c. She has started taking her metformin again and does feel better. She has also started working out.   Outpatient Encounter Medications as of 03/27/2023  Medication Sig   Blood Glucose Monitoring Suppl DEVI 1 each by Does not apply route in the morning, at noon, and at bedtime. May substitute to any manufacturer covered by patient's insurance.   Glucose Blood (BLOOD GLUCOSE TEST STRIPS) STRP 1 each by In Vitro route in the morning, at noon, and at bedtime. May substitute to any manufacturer covered by patient's insurance.   Lancet Device MISC 1 each by Does not apply route in the morning, at noon, and at bedtime. May substitute to any manufacturer covered by patient's insurance.   Lancets Misc. MISC 1 each by Does not apply route in the morning, at noon, and at bedtime. May substitute to any manufacturer covered by patient's insurance.   metFORMIN (GLUCOPHAGE-XR) 750 MG 24 hr tablet Take 1 tablet (750 mg total) by mouth 2 (two) times daily with a meal.   norethindrone (AYGESTIN) 5 MG tablet Take 1 tablet (5 mg total) by mouth daily.   SUMAtriptan (IMITREX) 25 MG tablet Take 25 mg (1 tablet total) by mouth at the start of the headache. May repeat in 2 hours x 1 if headache persists. Max of 2 tablets/24 hours.   cyclobenzaprine (FLEXERIL) 10 MG tablet Take 1 tablet (10 mg total) by mouth 3 (three) times daily as needed for muscle spasms. (Patient not taking: Reported on 02/11/2023)   fluconazole (DIFLUCAN) 150 MG tablet Take by mouth. (Patient not taking: Reported on 03/27/2023)   terconazole (TERAZOL 7) 0.4 % vaginal cream Place 1 applicator vaginally at  bedtime. Use for seven days (Patient not taking: Reported on 03/27/2023)   No facility-administered encounter medications on file as of 03/27/2023.    Past Medical History:  Diagnosis Date   Anemia    Back pain    BV (bacterial vaginosis)    Diabetes (HCC)    Diabetes (HCC)    Ectopic pregnancy    Infection    UTI   No pertinent past medical history    Obesity    Prediabetes    Sleep apnea    Strep throat    Trichomonas     Past Surgical History:  Procedure Laterality Date   SALPINGECTOMY     removal of ectopic preg and tube   TUBAL LIGATION N/A    Phreesia 10/25/2019    Family History  Problem Relation Age of Onset   Hypertension Mother    Thyroid disease Mother    Diabetes Mother    Cancer Mother    Heart disease Father        CHF   Hypertension Father    Sleep apnea Father    Alcoholism Father    Obesity Father    Diabetes Maternal Grandmother    Hypertension Maternal Grandmother    Cancer Maternal Grandmother        breast   Cancer Paternal Grandmother    Anesthesia problems Neg Hx    Hypotension Neg Hx    Malignant  hyperthermia Neg Hx    Pseudochol deficiency Neg Hx     Social History   Socioeconomic History   Marital status: Single    Spouse name: Not on file   Number of children: Not on file   Years of education: Not on file   Highest education level: Not on file  Occupational History   Occupation: [employer]   Occupation: special needs at a day program  Tobacco Use   Smoking status: Never   Smokeless tobacco: Never  Vaping Use   Vaping status: Never Used  Substance and Sexual Activity   Alcohol use: No    Comment: occ   Drug use: No   Sexual activity: Yes    Birth control/protection: None    Comment: monogamous rlship wiht female partner of 8 years; last intercourse one month ago  Other Topics Concern   Not on file  Social History Narrative   Not on file   Social Drivers of Health   Financial Resource Strain: Low Risk  (03/27/2023)    Overall Financial Resource Strain (CARDIA)    Difficulty of Paying Living Expenses: Not hard at all  Food Insecurity: No Food Insecurity (11/13/2022)   Hunger Vital Sign    Worried About Running Out of Food in the Last Year: Never true    Ran Out of Food in the Last Year: Never true  Transportation Needs: No Transportation Needs (11/13/2022)   PRAPARE - Administrator, Civil Service (Medical): No    Lack of Transportation (Non-Medical): No  Physical Activity: Sufficiently Active (03/27/2023)   Exercise Vital Sign    Days of Exercise per Week: 7 days    Minutes of Exercise per Session: 30 min  Stress: No Stress Concern Present (03/27/2023)   [employer] of Occupational Health - Occupational Stress Questionnaire    Feeling of Stress : Only a little  Social Connections: Moderately Isolated (03/27/2023)   Social Connection and Isolation Panel [NHANES]    Frequency of Communication with Friends and Family: More than three times a week    Frequency of Social Gatherings with Friends and Family: More than three times a week    Attends Religious Services: More than 4 times per year    Active Member of [employer] or Organizations: No    Attends Banker Meetings: Never    Marital Status: Never married  Intimate Partner Violence: Not At Risk (03/27/2023)   Humiliation, Afraid, Rape, and Kick questionnaire    Fear of Current or Ex-Partner: No    Emotionally Abused: No    Physically Abused: No    Sexually Abused: No    Review of Systems  All other systems reviewed and are negative.       Objective    BP 116/81 (BP Location: Right Arm, Patient Position: Sitting, Cuff Size: Large)   Pulse 88   Temp 98.6 F (37 C) (Oral)   Resp 18   Ht 5\' 6"  (1.676 m)   Wt 272 lb 9.6 oz (123.7 kg)   LMP 03/10/2023 (Approximate)   SpO2 96%   BMI 44.00 kg/m   Physical Exam Vitals and nursing note reviewed.  Constitutional:      General: She is not in acute distress.     Appearance: She is obese.  Cardiovascular:     Rate and Rhythm: Normal rate and regular rhythm.  Pulmonary:     Effort: Pulmonary effort is normal.     Breath sounds: Normal breath sounds.  Abdominal:  Palpations: Abdomen is soft.     Tenderness: There is no abdominal tenderness.  Neurological:     General: No focal deficit present.     Mental Status: She is alert and oriented to person, place, and time.         Assessment & Plan:   1. Type 2 diabetes mellitus with hyperglycemia, without long-term current use of insulin (HCC) (Primary) Discussed compliance with patient and will be referring to [facility] for med management. Also discussed that insulin or injectable meds would possibly be recommended.   2. Class 3 severe obesity with serious comorbidity and body mass index (BMI) of 40.0 to 44.9 in adult, unspecified obesity type (HCC)     Return in about 3 months (around 06/25/2023) for follow up, chronic med issues.   Tommie Raymond, MD

## 2023-04-01 ENCOUNTER — Ambulatory Visit: Payer: No Typology Code available for payment source | Admitting: Pharmacist

## 2023-04-04 ENCOUNTER — Encounter: Payer: Self-pay | Admitting: Pharmacist

## 2023-04-04 ENCOUNTER — Telehealth: Payer: Self-pay | Admitting: Pharmacist

## 2023-04-04 ENCOUNTER — Ambulatory Visit: Payer: No Typology Code available for payment source | Attending: Nurse Practitioner | Admitting: Pharmacist

## 2023-04-04 DIAGNOSIS — Z7984 Long term (current) use of oral hypoglycemic drugs: Secondary | ICD-10-CM

## 2023-04-04 DIAGNOSIS — E1169 Type 2 diabetes mellitus with other specified complication: Secondary | ICD-10-CM

## 2023-04-04 LAB — POCT GLYCOSYLATED HEMOGLOBIN (HGB A1C): HbA1c, POC (controlled diabetic range): 9.1 % — AB (ref 0.0–7.0)

## 2023-04-04 NOTE — Telephone Encounter (Signed)
 LIBERATE Study  Received referral for patient participation in the LIBERATE CGM Study. Contacted patient to discuss study and confirmed HIPAA identifiers. Confirmed patient was provided the LIBERATE Study Information Sheet and any questions were answered.   Confirmed that patient meets study criteria by having a diagnosis of Type 2 Diabetes, is not currently on insulin, and most recent A1c is >8%.  Patient provided verbal consent to participate in the study. Consent documented in electronic medical record.   - Confirmed that patient has a compatible smart phone to download [employer] 3 app. ([email or web address]) - Asked to download and create a Josephine Igo account prior to first study visit.  - Scheduled first study visit today. Confirmed patient has transportation to this appointment.  - Discussed use of MyChart in this study. Confirmed patient has an active MyChart account and is aware of their log in information.  Patient aware of pre-visit questionnaire that will be sent 2 days prior to their scheduled study visit and they will plan to complete before the visit.    Butch Penny, PharmD, P[place]y [place], CPP Clinical Pharmacist [facility] [phone]

## 2023-04-04 NOTE — Progress Notes (Signed)
S:     No chief complaint on file.  38 y.o. female who presents for diabetes evaluation, education, and management in the context of the LIBERATE study. Patient arrives in good spirits and presents without any assistance.   Patient was referred and last seen by Primary Care Provider, Dr. Georganna Skeans, on 03/27/2023. PMH is significant for T2DM, obstructive sleep apnea, obesity, abnormal uterine bleeding, mastalgia, ectopic pregnancy, vitamin D deficiency.   Today, pt reports in good spirits. Interested in CGM but seems unsure. Reports DM being initially dx'd as preDM but was dx'd as type 2 in 2024. She was taking metformin in the past but stopped it. She thinks this is most likely what led her preDM to progress to T2DM. She restarted metformincouple weeks ago. Denies any GI side effects with the metformin so far.   Family/Social History:  -diabetes -no family history of pancreatitis/thyroid cancer  Current diabetes medications include: metformin 750 mg BID Patient reports adherence to taking all medications as prescribed.   Insurance: [employer]  Patient denies hypoglycemic events.  Reported home fasting blood sugars: none reported  Reported 2 hour post-meal/random blood sugars: 150, 108.  Patient denies nocturia (nighttime urination).  Patient denies neuropathy (nerve pain). Patient denies visual changes. Patient denies self foot exams.   Patient reported dietary habits: Eats 1-2 meals/day Snacks: fruit Drinks: shakes  Patient-reported exercise habits:  -Walks 5 days/week at work  O:    [place] Value Date   HGBA1C 9.1 (A) 04/04/2023   There were no vitals filed for this visit.  Lipid Panel     Component Value Date/Time   CHOL 133 12/31/2021 1504   TRIG 164 (H) 12/31/2021 1504   HDL 36 (L) 12/31/2021 1504   CHOLHDL 3.7 12/31/2021 1504   LDLCALC 69 12/31/2021 1504    Clinical Atherosclerotic Cardiovascular Disease (ASCVD): No  The ASCVD  Risk score (Arnett DK, et al., 2019) failed to calculate for the following reasons:   The 2019 ASCVD risk score is only valid for ages 73 to 15   Patient is participating in a Managed Medicaid Plan: No  A/P:  LIBERATE Study:  -Patient provided verbal consent to participate in the study. Consent documented in electronic medical record.  -Provided education on Libre 3 CGM. Collaborated to ensure Josephine Igo 3 app was downloaded on patient's phone. Educated on how to place sensor every 14 days, patient placed first sensor correctly and verbalized understanding of use, removal, and how to place next sensor. Discussed alarms. 8 sensors provided for a 3 month supply. Educated to contact the office if the sensor falls off early and replacements are needed before their next [employer].   Diabetes currently above goal given today's A1c. Patient is able to verbalize appropriate hypoglycemia management plan. Medication adherence appears optimal. We will assess up-titration of metformin in next visit. -Continued metformin 750 mg BID. -Patient educated on purpose, proper use, and potential adverse effects of metformin.  -Extensively discussed pathophysiology of diabetes, recommended lifestyle interventions, dietary effects on blood sugar control.  -Counseled on s/sx of and management of hypoglycemia.  -Next A1c anticipated 06/2023.   Written patient instructions provided. Patient verbalized understanding of treatment plan.  Total time in face to face counseling 20 minutes.    Follow-up:  Pharmacy: 1 month  Patient seen with  Haywood Filler, PharmD Candidate [employer] 20[employer]  Butch Penny, PharmD, [place], CPP Clinical Pharmacist [facility] [phone]

## 2023-04-07 NOTE — Progress Notes (Signed)
Chief Complaint: No chief complaint on file.   History of Present Illness:  Regina Watkins is a 38 y.o. female who is seen in consultation from Georganna Skeans, MD for evaluation of of microscopic hematuria.  She denies any history of gross hematuria.  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  She has had several physicians tell her through the past 2 to 3 years that she does have microscopic hematuria.  Most recent dipstick urine revealed moderate blood, although no microscopic was done.  Last microscopic urinalysis was from mid 2023 with 6-10 red cells seen per microscopic high-power field.  She does not have frequent urinary tract infections.  She has not needed to see a urologist before.  She has not had upper urinary tract imaging.   Past Medical History:  Past Medical History:  Diagnosis Date   Anemia    Back pain    BV (bacterial vaginosis)    Diabetes (HCC)    Diabetes (HCC)    Ectopic pregnancy    Infection    UTI   No pertinent past medical history    Obesity    Prediabetes    Sleep apnea    Strep throat    Trichomonas     Past Surgical History:  Past Surgical History:  Procedure Laterality Date   SALPINGECTOMY     removal of ectopic preg and tube   TUBAL LIGATION N/A    Phreesia 10/25/2019    Allergies:  Allergies  Allergen Reactions   Latex Other (See Comments)    Latex condoms cause bacterial infections and irritation.    Family History:  Family History  Problem Relation Age of Onset   Hypertension Mother    Thyroid disease Mother    Diabetes Mother    Cancer Mother    Heart disease Father        CHF   Hypertension Father    Sleep apnea Father    Alcoholism Father    Obesity Father    Diabetes Maternal Grandmother    Hypertension Maternal Grandmother    Cancer Maternal Grandmother        breast   Cancer Paternal Grandmother    Anesthesia problems Neg Hx    Hypotension Neg Hx    Malignant hyperthermia Neg Hx     Pseudochol deficiency Neg Hx     Social History:  Social History   Tobacco Use   Smoking status: Never   Smokeless tobacco: Never  Vaping Use   Vaping status: Never Used  Substance Use Topics   Alcohol use: No    Comment: occ   Drug use: No    Review of symptoms:  Constitutional:  Negative for unexplained weight loss, night sweats, fever, chills ENT:  Negative for nose bleeds, sinus pain, painful swallowing CV:  Negative for chest pain, shortness of breath, exercise intolerance, palpitations, loss of consciousness Resp:  Negative for cough, wheezing, shortness of breath GI:  Negative for nausea, vomiting, diarrhea, bloody stools GU:  Positives noted in HPI; otherwise negative for gross hematuria, dysuria, urinary incontinence Neuro:  Negative for seizures, poor balance, limb weakness, slurred speech Psych:  Negative for lack of energy, depression, anxiety Endocrine:  Negative for polydipsia, polyuria, symptoms of hypoglycemia (dizziness, hunger, sweating) Hematologic:  Negative for anemia, purpura, petechia, prolonged or excessive bleeding, use of anticoagulants  Allergic:  Negative for difficulty breathing or choking as a result of exposure to anything; no shellfish allergy; no allergic response (  rash/itch) to materials, foods  Physical exam: There were no vitals taken for this visit. GENERAL APPEARANCE:  Well appearing, well developed, well nourished, NAD HEENT: Atraumatic, Normocephalic. NECK: Normal in appearance LUNGS: Normal inspiratory and expiratory excursion. HEART: Regular Rate. EXTREMITIES: Moves all extremities well.  Without clubbing, cyanosis, or edema. NEUROLOGIC:  Alert and oriented x 3, normal gait, CN II-XII grossly intact.  MENTAL STATUS:  Appropriate. SKIN:  Warm, dry and intact.    Results: I reviewed prior imaging studies-none involve the upper urinary tract  I have reviewed referring/prior physicians records  I have reviewed urinalysis  I  have reviewed prior urine cultures   Assessment: Microscopic hematuria, not noted on urinalysis today.  No history of high risk behavior.   Plan: At this point, I do not think she needs further evaluation  I discussed with her causative factors of microscopic hematuria.  With her, she may just have external contamination as her source  I will have her come back in 2 months.  At that time we will perform catheterized urinalysis.

## 2023-04-11 ENCOUNTER — Telehealth: Payer: Self-pay | Admitting: Pharmacist

## 2023-04-11 NOTE — Telephone Encounter (Signed)
 LIBERATE Study  Patient completed first study visit for the LIBERATE CGM Study. Contacted patient to discuss CGM tolerability. Confirmed HIPAA identifiers.   Date of Download: 2/1 - 04/11/2023 % Time CGM is active: 96% Average Glucose: 131 mg/dL Glucose Management Indicator: 6.4  Glucose Variability: 24.3 (goal <36%) Time in Goal:  - Time in range 70-180: 92% - Time above range: 6% - Time below range: 2%   Patient confirms Libre 3 sensors is working and glucose values are transmitting appropriately. Denies any questions or concerns.   - Confirmed visit on 05/01/23. Confirmed patient has transportation to this appointment.  - Discussed use of MyChart in this study. Confirmed patient has an active MyChart account and is aware of their log in information.  Patient aware of pre-visit questionnaire that will be sent 2 days prior to their scheduled study visit and they will plan to complete before the visit.  Herlene Fleeta Morris, PharmD, JAQUELINE, CPP Clinical Pharmacist [facility] [phone]

## 2023-04-15 ENCOUNTER — Telehealth: Payer: Self-pay | Admitting: Family Medicine

## 2023-04-15 NOTE — Telephone Encounter (Signed)
Patient was identified as falling into the [place] Measure - Diabetes.   Patient was seen wit Franky Macho for Diabetes Management 04/04/2023 patient has follow up appointment scheduled with luke on 05/01/2023

## 2023-04-21 ENCOUNTER — Ambulatory Visit (INDEPENDENT_AMBULATORY_CARE_PROVIDER_SITE_OTHER): Payer: No Typology Code available for payment source | Admitting: Urology

## 2023-04-21 VITALS — BP 136/85 | HR 90 | Ht 66.0 in | Wt 270.0 lb

## 2023-04-21 DIAGNOSIS — R3129 Other microscopic hematuria: Secondary | ICD-10-CM | POA: Diagnosis not present

## 2023-04-21 LAB — MICROSCOPIC EXAMINATION: Epithelial Cells (non renal): >10 /[HPF] — ABNORMAL HIGH (ref 0–10)

## 2023-04-21 LAB — URINALYSIS, ROUTINE W REFLEX MICROSCOPIC
Bilirubin, UA: NEGATIVE
Glucose, UA: NEGATIVE
Ketones, UA: NEGATIVE
Leukocytes,UA: NEGATIVE
Nitrite, UA: NEGATIVE
Protein,UA: NEGATIVE
Specific Gravity, UA: 1.02 (ref 1.005–1.030)
Urobilinogen, Ur: 1 mg/dL (ref 0.2–1.0)
pH, UA: 5.5 (ref 5.0–7.5)

## 2023-05-01 ENCOUNTER — Ambulatory Visit: Payer: No Typology Code available for payment source | Admitting: Pharmacist

## 2023-06-02 ENCOUNTER — Telehealth: Payer: Self-pay | Admitting: Pharmacist

## 2023-06-02 ENCOUNTER — Encounter: Payer: Self-pay | Admitting: Pharmacist

## 2023-06-02 ENCOUNTER — Ambulatory Visit: Payer: No Typology Code available for payment source | Attending: Family Medicine | Admitting: Pharmacist

## 2023-06-02 ENCOUNTER — Other Ambulatory Visit: Payer: Self-pay

## 2023-06-02 DIAGNOSIS — E119 Type 2 diabetes mellitus without complications: Secondary | ICD-10-CM

## 2023-06-02 DIAGNOSIS — Z7984 Long term (current) use of oral hypoglycemic drugs: Secondary | ICD-10-CM

## 2023-06-02 DIAGNOSIS — E1169 Type 2 diabetes mellitus with other specified complication: Secondary | ICD-10-CM

## 2023-06-02 MED ORDER — TIRZEPATIDE 5 MG/0.5ML ~~LOC~~ SOAJ: 5.0000 mg | SUBCUTANEOUS | 0 refills | Status: DC

## 2023-06-02 NOTE — Progress Notes (Signed)
     S:     No chief complaint on file.  38 y.o. female who presents for diabetes evaluation, education, and management. Patient arrives in good spirits and presents without any assistance.   Patient was referred and last seen by Primary Care Provider, Dr. Georganna Skeans, on 03/27/2023. PMH is significant for T2DM, obstructive sleep apnea, obesity, abnormal uterine bleeding, mastalgia, ectopic pregnancy, vitamin D deficiency. We saw her 04/04/23 and enrolled her in liberate.  Today, pt reports in good spirits. Reports that the [place] sensors have been difficult to deal with at time but have helped her manage her diabetes. Remains adherent to the metformin. Denies any GI side effects with the metformin so far.   Family/Social History:  -diabetes -no family history of pancreatitis/thyroid cancer  Current diabetes medications include: metformin 750 mg BID Patient reports adherence to taking all medications as prescribed.   Insurance: [employer]  Patient denies hypoglycemic events.  Patient denies nocturia (nighttime urination).  Patient denies neuropathy (nerve pain). Patient denies visual changes. Patient denies self foot exams.   Patient reported dietary habits: Eats 1-2 meals/day Snacks: fruit Drinks: shakes  Patient-reported exercise habits:  -Walks 5 days/week at work -Works out at [employer] ~3 days a week (45 minutes at a time)  O:  Date of Download: 06/02/2023 for a 60 day report % Time CGM is active: 85% Average Glucose: 178 mg/dL Glucose Management Indicator: 7.6 Glucose Variability: 17.2 (goal <36%) Time in Goal:  - Time in range 70-180: 55% - Time above range: 45% - Time below range: 0%  Lab Results  Component Value Date   HGBA1C 9.1 (A) 04/04/2023   There were no vitals filed for this visit.  Lipid Panel     Component Value Date/Time   CHOL 133 12/31/2021 1504   TRIG 164 (H) 12/31/2021 1504   HDL 36 (L) 12/31/2021 1504   CHOLHDL 3.7 12/31/2021 1504    LDLCALC 69 12/31/2021 1504    Clinical Atherosclerotic Cardiovascular Disease (ASCVD): No  The ASCVD Risk score (Arnett DK, et al., 2019) failed to calculate for the following reasons:   The 2019 ASCVD risk score is only valid for ages 84 to 76   Patient is participating in a Managed Medicaid Plan: No  A/P: Diabetes currently above goal given last A1c, however, home blood sugar control is improving. She is not currently symptomatic but is able to verbalize appropriate hypoglycemia management plan. Medication adherence appears optimal. She expresses interest in [place] today. We will start the process of getting insurance coverage for [facility]. -Continued metformin 750 mg BID. -Start Mounjaro 5 mg weekly.  -Patient educated on purpose, proper use, and potential adverse effects of Mounjaro.  -Extensively discussed pathophysiology of diabetes, recommended lifestyle interventions, dietary effects on blood sugar control.  -Counseled on s/sx of and management of hypoglycemia.  -Next A1c anticipated 06/2023.   Written patient instructions provided. Patient verbalized understanding of treatment plan.  Total time in face to face counseling 20 minutes.    Follow-up:  Pharmacy: 1 month for Liberate visit #2  Butch Penny, PharmD, [place], CPP Clinical Pharmacist [facility] [phone]

## 2023-06-02 NOTE — Telephone Encounter (Signed)
 Regina Watkins,   Can we start a PA for this patient's Mounjaro?

## 2023-06-23 ENCOUNTER — Ambulatory Visit: Payer: No Typology Code available for payment source | Admitting: Urology

## 2023-06-29 ENCOUNTER — Other Ambulatory Visit: Payer: Self-pay | Admitting: Family

## 2023-06-29 DIAGNOSIS — E1165 Type 2 diabetes mellitus with hyperglycemia: Secondary | ICD-10-CM

## 2023-06-29 NOTE — Progress Notes (Unsigned)
 S:     No chief complaint on file.  38 y.o. female who presents for diabetes evaluation, education, and management in the context of the LIBERATE Study.   Patient was referred and last seen by Primary Care Provider, Dr. Abraham Abo, on 03/27/2023. PMH is significant for T2DM, obstructive sleep apnea, obesity, abnormal uterine bleeding, mastalgia, ectopic pregnancy, vitamin D  deficiency. We saw her 04/04/23 and enrolled her in liberate. At last pharmacy follow-up on 06/02/23, she was initiated on Mounjaro 5 mg weekly.  Today, patient arrives in *** good spirits and presents without *** any assistance. ***  Patient reports Diabetes was diagnosed in ***.   Family/Social History:  -diabetes -no family history of pancreatitis/thyroid cancer  Current diabetes medications include: metformin  750 mg BID, tirzepatide 5 mg subcutaneous weekly (not picked up) Current hypertension medications include: none Current hyperlipidemia medications include: none  Patient reports adherence to taking all medications as prescribed.  *** Patient denies adherence with medications, reports missing *** medications *** times per week, on average.  Do you feel that your medications are working for you? {YES NO:22349} Have you been experiencing any side effects to the medications prescribed? {YES NO:22349} Do you have any problems obtaining medications due to transportation or finances? {YES WU:13244} Insurance coverage: [employer]  Patient {Actions; denies-reports:120008} hypoglycemic events.     Patient {Actions; denies-reports:120008} nocturia (nighttime urination).  Patient {Actions; denies-reports:120008} neuropathy (nerve pain). Patient {Actions; denies-reports:120008} visual changes. Patient {Actions; denies-reports:120008} self foot exams.   Patient reported dietary habits: Eats 1-2 meals/day Snacks: fruit Drinks: shakes  Breakfast: *** Lunch: *** Dinner: *** Snacks:  *** Drinks: ***  Within the past 12 months, did you worry whether your food would run out before you got money to buy more? {YES NO:22349} Within the past 12 months, did the food you bought run out, and you didn't have money to get more? {YES NO:22349} PHQ-9 Score: ***  Patient-reported exercise habits:  -Walks 5 days/week at work -Works out at [employer] ~3 days a week (45 minutes at a time)   O:      ROS  Physical Exam   Lab Results  Component Value Date   HGBA1C 9.1 (A) 04/04/2023    ***POC A1c Today: ***  There were no vitals filed for this visit.   Lipid Panel     Component Value Date/Time   CHOL 133 12/31/2021 1504   TRIG 164 (H) 12/31/2021 1504   HDL 36 (L) 12/31/2021 1504   CHOLHDL 3.7 12/31/2021 1504   LDLCALC 69 12/31/2021 1504    Clinical Atherosclerotic Cardiovascular Disease (ASCVD): {YES/NO:21197} The ASCVD Risk score (Arnett DK, et al., 2019) failed to calculate for the following reasons:   The 2019 ASCVD risk score is only valid for ages 39 to 28   Patient is participating in a Managed Medicaid Plan:  {MM YES/NO:27447::"Yes"}     A/P:  LIBERATE Study:  - 8 sensors provided for a 3 month supply. Educated to contact the office if the sensor falls off early and replacements are needed before their next [employer].   Diabetes longstanding *** currently ***. Patient is *** able to verbalize appropriate hypoglycemia management plan. Medication adherence appears ***. Control is suboptimal due to ***. -{Meds adjust:18428} basal insulin  *** (insulin  ***). Patient will continue to titrate 1 unit every *** days if fasting blood sugar > 100mg /dl until fasting blood sugars reach goal or next visit.  -{Meds adjust:18428} rapid insulin  *** (insulin  ***) to ***.  -{  Meds adjust:18428} GLP-1 *** (generic ***) to ***.  -{Meds adjust:18428} SGLT2-I *** (generic ***) to ***. Counseled on sick day rules. -{Meds adjust:18428} metformin  *** to ***.  -Patient  educated on purpose, proper use, and potential adverse effects of ***.  -Extensively discussed pathophysiology of diabetes, recommended lifestyle interventions, dietary effects on blood sugar control.  -Counseled on s/sx of and management of hypoglycemia.  -Next A1c anticipated ***.   ASCVD risk - primary ***secondary prevention in patient with diabetes. Last LDL is *** not at goal of <09 *** mg/dL. ASCVD risk factors include *** and 10-year ASCVD risk score of ***. {Desc; low/moderate/high:110033} intensity statin indicated.  -{Meds adjust:18428} ***statin *** mg.   Hypertension longstanding *** currently ***. Blood pressure goal of <130/80 *** mmHg. Medication adherence ***. Blood pressure control is suboptimal due to ***. -***  Written patient instructions provided. Patient verbalized understanding of treatment plan.  Total time in face to face counseling *** minutes.    Follow-up:  Pharmacist 1 month ***. PCP clinic visit in ***. - needs to schedule

## 2023-06-30 ENCOUNTER — Telehealth: Payer: Self-pay

## 2023-06-30 ENCOUNTER — Other Ambulatory Visit (HOSPITAL_COMMUNITY): Payer: Self-pay

## 2023-06-30 ENCOUNTER — Ambulatory Visit: Attending: Family Medicine | Admitting: Pharmacist

## 2023-06-30 ENCOUNTER — Encounter: Payer: Self-pay | Admitting: Pharmacist

## 2023-06-30 ENCOUNTER — Ambulatory Visit: Admitting: Urology

## 2023-06-30 DIAGNOSIS — E1169 Type 2 diabetes mellitus with other specified complication: Secondary | ICD-10-CM | POA: Diagnosis not present

## 2023-06-30 DIAGNOSIS — Z7985 Long-term (current) use of injectable non-insulin antidiabetic drugs: Secondary | ICD-10-CM | POA: Diagnosis not present

## 2023-06-30 LAB — POCT GLYCOSYLATED HEMOGLOBIN (HGB A1C): HbA1c, POC (controlled diabetic range): 8.1 % — AB (ref 0.0–7.0)

## 2023-06-30 MED ORDER — TIRZEPATIDE 2.5 MG/0.5ML ~~LOC~~ SOAJ: 2.5000 mg | SUBCUTANEOUS | 0 refills | Status: DC

## 2023-06-30 NOTE — Telephone Encounter (Signed)
 Last office visit on 03/27/2023 with Abraham Abo, MD. Please send to Abraham Abo, MD to see if refill is appropriate. Thank you.

## 2023-06-30 NOTE — Telephone Encounter (Signed)
 Copied from CRM [id number]. Topic: Clinical - Prescription Issue >> Jun 30, 2023  2:47 PM Phil Braun wrote: Reason for CRM:   Ref:tirzepatide (MOUNJARO) 5 MG/0.5ML Pen  {Pt got a letter from her ins stating that she will be needing a prior authorization for this medication beginning July 1st.) Please advise.

## 2023-07-01 ENCOUNTER — Other Ambulatory Visit: Payer: Self-pay | Admitting: Family

## 2023-07-01 DIAGNOSIS — E1165 Type 2 diabetes mellitus with hyperglycemia: Secondary | ICD-10-CM

## 2023-07-05 ENCOUNTER — Other Ambulatory Visit: Payer: Self-pay | Admitting: Family

## 2023-07-05 DIAGNOSIS — E1165 Type 2 diabetes mellitus with hyperglycemia: Secondary | ICD-10-CM

## 2023-07-07 ENCOUNTER — Other Ambulatory Visit: Payer: Self-pay | Admitting: Family Medicine

## 2023-07-07 NOTE — Telephone Encounter (Signed)
 Most recent office visit for diabetes management with Abraham Abo, MD on 03/27/2023. Please send to correct provider to refill if appropriate. Thank you.

## 2023-07-07 NOTE — Telephone Encounter (Signed)
 Copied from CRM [id number]. Topic: Clinical - Medication Refill >> Jul 07, 2023 12:59 PM Baldomero Bone wrote: Most Recent Primary Care Visit:  Provider: Valente Gaskin  Department: [facility]  Visit Type: RESEARCH VISIT  Date: 06/30/2023  Medication: metFORMIN  (GLUCOPHAGE -XR) 750 MG 24 hr tablet  Has the patient contacted their pharmacy? Yes (Agent: If no, request that the patient contact the pharmacy for the refill. If patient does not wish to contact the pharmacy document the reason why and proceed with request.) (Agent: If yes, when and what did the pharmacy advise?)  Is this the correct pharmacy for this prescription? Yes If no, delete pharmacy and type the correct one.  This is the patient's preferred pharmacy:  [facility] - [place] ([place] - [address] [place] ([place]) [place] [address] Phone: [phone] Fax: [phone]   Has the prescription been filled recently? No  Is the patient out of the medication? Yes  Has the patient been seen for an appointment in the last year OR does the patient have an upcoming appointment? Yes  Can we respond through MyChart? Yes  Agent: Please be advised that Rx refills may take up to 3 business days. We ask that you follow-up with your pharmacy.

## 2023-07-09 ENCOUNTER — Ambulatory Visit: Admitting: Urology

## 2023-07-14 ENCOUNTER — Other Ambulatory Visit: Payer: Self-pay | Admitting: Family Medicine

## 2023-07-14 ENCOUNTER — Other Ambulatory Visit: Payer: Self-pay | Admitting: Family

## 2023-07-14 DIAGNOSIS — E1165 Type 2 diabetes mellitus with hyperglycemia: Secondary | ICD-10-CM

## 2023-07-14 NOTE — Telephone Encounter (Signed)
 Copied from CRM [id number]. Topic: Clinical - Medication Refill >> Jul 14, 2023 10:47 AM Carlatta H wrote: Medication: metFORMIN  (GLUCOPHAGE -XR) 750 MG 24 hr tablet [Pharmacy Med Name: metFORMIN  HCl ER 750 MG Oral Tablet Extended Release 24 Hour] [[id number]]  Has the patient contacted their pharmacy? No (Agent: If no, request that the patient contact the pharmacy for the refill. If patient does not wish to contact the pharmacy document the reason why and proceed with request.) (Agent: If yes, when and what did the pharmacy advise?)  This is the patient's preferred pharmacy:  [facility] - [place] - [address] [place] ([place]) [place] [address] Phone: [phone] Fax: [phone]  Is this the correct pharmacy for this prescription? Yes If no, delete pharmacy and type the correct one.   Has the prescription been filled recently? No  Is the patient out of the medication? Yes  Has the patient been seen for an appointment in the last year OR does the patient have an upcoming appointment? No  Can we respond through MyChart? Yes  Agent: Please be advised that Rx refills may take up to 3 business days. We ask that you follow-up with your pharmacy.

## 2023-07-16 NOTE — Telephone Encounter (Signed)
 Duplicate request, refilled 07/14/23 for 90 days.E-Prescribing Status: Receipt confirmed by pharmacy (07/14/2023  3:59 PM EDT).  Requested Prescriptions  Pending Prescriptions Disp Refills   metFORMIN  (GLUCOPHAGE -XR) 750 MG 24 hr tablet 180 tablet 0    Sig: Take 1 tablet (750 mg total) by mouth 2 (two) times daily with a meal.     Endocrinology:  Diabetes - Biguanides Failed - 07/16/2023  9:41 AM      Failed - HBA1C is between 0 and 7.9 and within 180 days    HbA1c, POC (controlled diabetic range)  Date Value Ref Range Status  06/30/2023 8.1 (A) 0.0 - 7.0 % Final         Failed - CBC within normal limits and completed in the last 12 months    WBC  Date Value Ref Range Status  03/13/2023 6.4 3.4 - 10.8 x10E3/uL Final  10/11/2021 8.6 4.0 - 10.5 K/uL Final   RBC  Date Value Ref Range Status  03/13/2023 5.10 3.77 - 5.28 x10E6/uL Final  10/11/2021 4.68 3.87 - 5.11 MIL/uL Final   Hemoglobin  Date Value Ref Range Status  03/13/2023 14.0 11.1 - 15.9 g/dL Final   Hematocrit  Date Value Ref Range Status  03/13/2023 44.1 34.0 - 46.6 % Final   MCHC  Date Value Ref Range Status  03/13/2023 31.7 31.5 - 35.7 g/dL Final  40/98/1191 47.8 30.0 - 36.0 g/dL Final   [facility]  Date Value Ref Range Status  03/13/2023 27.5 26.6 - 33.0 pg Final  10/11/2021 27.4 26.0 - 34.0 pg Final   MCV  Date Value Ref Range Status  03/13/2023 87 79 - 97 fL Final   No results found for: "PLTCOUNTKUC", "LABPLAT", "POCPLA" RDW  Date Value Ref Range Status  03/13/2023 12.8 11.7 - 15.4 % Final         Passed - Cr in normal range and within 360 days    Creatinine, Ser  Date Value Ref Range Status  03/20/2023 0.70 0.57 - 1.00 mg/dL Final         Passed - eGFR in normal range and within 360 days    GFR calc Af Amer  Date Value Ref Range Status  10/29/2019 110 >59 mL/min/1.73 Final    Comment:    **[facility] currently reports eGFR in compliance with the current**   recommendations of the [employer]. [facility] will   update reporting as new guidelines are published from the NKF-ASN   Task force.    GFR, Estimated  Date Value Ref Range Status  10/11/2021 >60 >60 mL/min Final    Comment:    (NOTE) Calculated using the CKD-EPI Creatinine Equation (2021)    eGFR  Date Value Ref Range Status  03/20/2023 114 >59 mL/min/1.73 Final         Passed - B12 Level in normal range and within 720 days    Vitamin B-12  Date Value Ref Range Status  02/06/2022 355 232 - 1,245 pg/mL Final         Passed - Valid encounter within last 6 months    Recent Outpatient Visits           1 month ago Type 2 diabetes mellitus with other specified complication, without long-term current use of insulin  (HCC)   [facility] [place] Freada Jacobs, Anthony L, RPH-CPP   3 months ago Type 2 diabetes mellitus with other specified complication, without long-term current use of  insulin  [facility])   [facility] [place]. [facility] Verdel Gitelman L, RPH-CPP   3 months ago Type 2 diabetes mellitus with hyperglycemia, without long-term current use of insulin  [facility])   [place] [facility], Ray Caffey, MD   3 months ago Type 2 diabetes mellitus with hyperglycemia, without long-term current use of insulin  [facility])   [place] [facility], [place], NP   10 months ago Dysuria   [facility] [facility], MD       Future Appointments             In 1 week Trent Frizzle, MD [facility] [place] [facility]   In 3 weeks Freada Jacobs, Jonathon Neighbors, RPH-CPP [facility] [place]. [facility]

## 2023-07-22 NOTE — Progress Notes (Signed)
 Chief Complaint: No chief complaint on file.   History of Present Illness:  Regina Watkins is here for f/u of microscopic hematuria.  She was originally seen in February of this year for microscopic hematuria.  Prior to office visit, she had moderate hemoglobin on dipstick.  However, microscopic exam was not done.  In our office, she had 0-2 red cells with multiple epithelial cells.  Past Medical History:  Past Medical History:  Diagnosis Date   Anemia    Back pain    BV (bacterial vaginosis)    Diabetes (HCC)    Diabetes (HCC)    Ectopic pregnancy    Infection    UTI   No pertinent past medical history    Obesity    Prediabetes    Sleep apnea    Strep throat    Trichomonas     Past Surgical History:  Past Surgical History:  Procedure Laterality Date   SALPINGECTOMY     removal of ectopic preg and tube   TUBAL LIGATION N/A    Phreesia 10/25/2019    Allergies:  Allergies  Allergen Reactions   Latex Other (See Comments)    Latex condoms cause bacterial infections and irritation.    Family History:  Family History  Problem Relation Age of Onset   Hypertension Mother    Thyroid disease Mother    Diabetes Mother    Cancer Mother    Heart disease Father        CHF   Hypertension Father    Sleep apnea Father    Alcoholism Father    Obesity Father    Diabetes Maternal Grandmother    Hypertension Maternal Grandmother    Cancer Maternal Grandmother        breast   Cancer Paternal Grandmother    Anesthesia problems Neg Hx    Hypotension Neg Hx    Malignant hyperthermia Neg Hx    Pseudochol deficiency Neg Hx     Social History:  Social History   Tobacco Use   Smoking status: Never   Smokeless tobacco: Never  Vaping Use   Vaping status: Never Used  Substance Use Topics   Alcohol use: No    Comment: occ   Drug use: No    Review of symptoms:  Constitutional:  Negative for unexplained weight loss, night sweats, fever, chills ENT:  Negative for nose  bleeds, sinus pain, painful swallowing CV:  Negative for chest pain, shortness of breath, exercise intolerance, palpitations, loss of consciousness Resp:  Negative for cough, wheezing, shortness of breath GI:  Negative for nausea, vomiting, diarrhea, bloody stools GU:  Positives noted in HPI; otherwise negative for gross hematuria, dysuria, urinary incontinence Neuro:  Negative for seizures, poor balance, limb weakness, slurred speech Psych:  Negative for lack of energy, depression, anxiety Endocrine:  Negative for polydipsia, polyuria, symptoms of hypoglycemia (dizziness, hunger, sweating) Hematologic:  Negative for anemia, purpura, petechia, prolonged or excessive bleeding, use of anticoagulants  Allergic:  Negative for difficulty breathing or choking as a result of exposure to anything; no shellfish allergy; no allergic response (rash/itch) to materials, foods  Physical exam: There were no vitals taken for this visit. GENERAL APPEARANCE:  Well appearing, well developed, well nourished, NAD HEENT: Atraumatic, Normocephalic. NECK: Normal in appearance LUNGS: Normal inspiratory and expiratory excursion. HEART: Regular Rate. EXTREMITIES: Moves all extremities well.  Without clubbing, cyanosis, or edema. NEUROLOGIC:  Alert and oriented x 3, normal gait, CN II-XII grossly intact.  MENTAL STATUS:  Appropriate. SKIN:  Warm, dry and intact.    Results:  I have reviewed prior office notes  I have reviewed prior urinalyses  Her urinalysis today had 1+ hemoglobin on dipstick but no red blood cells   Assessment: Microscopic hematuria, not noted on urinalysis today.  No history of high risk behavior.   Plan: I will have her come back on an as-needed basis

## 2023-07-23 ENCOUNTER — Ambulatory Visit (INDEPENDENT_AMBULATORY_CARE_PROVIDER_SITE_OTHER): Admitting: Urology

## 2023-07-23 VITALS — BP 126/77 | HR 101 | Ht 66.0 in | Wt 270.0 lb

## 2023-07-23 DIAGNOSIS — R3129 Other microscopic hematuria: Secondary | ICD-10-CM

## 2023-07-23 LAB — URINALYSIS, ROUTINE W REFLEX MICROSCOPIC
Bilirubin, UA: NEGATIVE
Glucose, UA: NEGATIVE
Leukocytes,UA: NEGATIVE
Nitrite, UA: NEGATIVE
RBC, UA: NEGATIVE
Specific Gravity, UA: 1.02 (ref 1.005–1.030)
Urobilinogen, Ur: 1 mg/dL (ref 0.2–1.0)
pH, UA: 5.5 (ref 5.0–7.5)

## 2023-07-23 LAB — MICROSCOPIC EXAMINATION

## 2023-07-23 NOTE — Progress Notes (Signed)
 In and Out Catheterization  Patient is present today for a I & O catheterization due to microhematuria. Patient was cleaned and prepped in a sterile fashion with betadine . A 14FR cath was inserted no complications were noted , of urine return was noted, urine was clear yellow in color. A clean urine sample was collected for ua. Bladder was drained  And catheter was removed with out difficulty.    Performed by: C.Candee Cha, LPN

## 2023-07-23 NOTE — Addendum Note (Signed)
 Addended by: Nila Barth C on: 07/23/2023 11:43 AM   Modules accepted: Orders

## 2023-08-06 ENCOUNTER — Ambulatory Visit

## 2023-08-12 ENCOUNTER — Ambulatory Visit: Admitting: Pharmacist

## 2023-08-29 ENCOUNTER — Telehealth: Payer: Self-pay | Admitting: Family Medicine

## 2023-08-29 NOTE — Telephone Encounter (Signed)
 Patient was identified as falling into the [place] Measure - Diabetes.   Patient was: Appointment already scheduled for:  09/02/2023 with Herlene.

## 2023-09-02 ENCOUNTER — Encounter: Payer: Self-pay | Admitting: Pharmacist

## 2023-09-02 ENCOUNTER — Ambulatory Visit: Attending: Family Medicine | Admitting: Pharmacist

## 2023-09-02 ENCOUNTER — Other Ambulatory Visit: Payer: Self-pay

## 2023-09-02 ENCOUNTER — Ambulatory Visit: Admitting: Pharmacist

## 2023-09-02 VITALS — Wt 259.8 lb

## 2023-09-02 DIAGNOSIS — Z7985 Long-term (current) use of injectable non-insulin antidiabetic drugs: Secondary | ICD-10-CM

## 2023-09-02 DIAGNOSIS — E1169 Type 2 diabetes mellitus with other specified complication: Secondary | ICD-10-CM | POA: Diagnosis not present

## 2023-09-02 DIAGNOSIS — Z7984 Long term (current) use of oral hypoglycemic drugs: Secondary | ICD-10-CM | POA: Diagnosis not present

## 2023-09-02 MED ORDER — TIRZEPATIDE 2.5 MG/0.5ML ~~LOC~~ SOAJ
2.5000 mg | SUBCUTANEOUS | 1 refills | Status: DC
Start: 2023-09-02 — End: 2023-10-09

## 2023-09-02 NOTE — Progress Notes (Signed)
 S:     No chief complaint on file.  38 y.o. female who presents for diabetes evaluation, education, and management. Of note, we also have her enrolled in [employer] with her last study visit coming up later this month.  Patient was referred and last seen by Primary Care Provider, Dr. Raguel Watkins, on 03/27/2023. PMH is significant for T2DM, obstructive sleep apnea, obesity, abnormal uterine bleeding, mastalgia, ectopic pregnancy, vitamin D  deficiency. We saw her 04/04/23 and enrolled her in [employer]. At last pharmacy follow-up on 06/30/2023, she was started on Mounjaro  2.5 mg weekly.  Today, patient arrives in  good spirits and presents without any assistance. She endorses adherence to the Mounjaro . Unfortunately, she started with the 5 mg weekly dose and developed GI side effects, including nausea and vomiting. She was able to get and start the 2.5 mg weekly dose and is tolerating this better. Denies any current vomiting or abdominal pain. She is having some stomach upset and belching. Additionally, she notes a decreased appetite with the Mounjaro . However, these side effects are tolerable. She also reports that she does not feel her CGM is reading accurately.   Family/Social History:  -diabetes -no family history of pancreatitis/thyroid cancer  Current diabetes medications include: metformin  750 mg BID, tirzepatide  2.5 mg subcutaneous weekly Patient reports adherence to taking all medications as prescribed.   Insurance coverage: [employer]  Patient denies hypoglycemic events.  Patient denies nocturia (nighttime urination).  Patient denies neuropathy (nerve pain). Patient denies visual changes. Patient denies polyphagia, polydipsia. Patient reports self foot exams.   Patient reported dietary habits: Eats 1-2 meals/day Snacks: fruit Drinks: shakes, drinks 3x large cups of water (Stanley cup)  Patient-reported exercise habits:  -Walks 5 days/week at work -Works  out at [employer] ~3 days a week (45 minutes at a time)   O:  Date of Download: 09/02/2023, 90-day download % Time CGM is active: 67% Average Glucose: 194 mg/dL Glucose Management Indicator: 8.0  Glucose Variability: 24.6 (goal <36%) Time in Goal:  - Time in range 70-180: 46% - Time above range: 54% - Time below range: 0%  Lab Results  Component Value Date   HGBA1C 8.1 (A) 06/30/2023   There were no vitals filed for this visit. \ Lipid Panel     Component Value Date/Time   CHOL 133 12/31/2021 1504   TRIG 164 (H) 12/31/2021 1504   HDL 36 (L) 12/31/2021 1504   CHOLHDL 3.7 12/31/2021 1504   LDLCALC 69 12/31/2021 1504    Clinical Atherosclerotic Cardiovascular Disease (ASCVD): No  The ASCVD Risk score (Arnett DK, et al., 2019) failed to calculate for the following reasons:   The 2019 ASCVD risk score is only valid for ages 42 to 88   Patient is participating in a Managed Medicaid Plan:  No   A/P Diabetes longstanding, currently above goal with A1C 8.1% (goal < 7%).  Her home glycemic control is improving. She had GI side effects with the 5mg  dose of Mounjaro  but is able to tolerate the 2.5 mg weekly dose. She would like the chance to stay on this dose and her current dose of metformin  before seeing me again in ~3 weeks for an A1c  check. Commended patient for improvements in diet and lifestyle that have led to improvements in BG control.  Medication adherence appears appropriate.  -Continued Mounjaro  (generic tirzepatide ) at 2.5 mg once weekly.   -Continued metformin  750 mg BID -Patient educated on purpose, proper use, and potential adverse  effects of tirzepatide  and metformin .  -Extensively discussed pathophysiology of diabetes, recommended lifestyle interventions, dietary effects on blood sugar control.  -Counseled on s/sx of and management of hypoglycemia.  -Next A1c anticipated July 2025.   Written patient instructions provided. Patient verbalized understanding of treatment  plan.  Total time in face to face counseling 30 minutes.    Follow-up:  Pharmacist 09/26/2023  Regina Watkins, PharmD, BCACP, CPP Clinical Pharmacist [facility] [place] [phone]

## 2023-09-17 ENCOUNTER — Ambulatory Visit: Payer: Self-pay

## 2023-09-17 NOTE — Telephone Encounter (Signed)
 Noted

## 2023-09-17 NOTE — Telephone Encounter (Signed)
 FYI Only or Action Required?: FYI only for provider.  Patient was last seen in primary care on 03/27/2023 by Tanda Bleacher, MD.  Called Nurse Triage reporting Leg Pain.  Symptoms began several months ago.  Interventions attempted: OTC medications: ibuprofen .  Symptoms are: unchanged.  Triage Disposition: See PCP When Office is Open (Within 3 Days)  Patient/caregiver understands and will follow disposition?: yes- UC appt made no openings in office        Copied from CRM [id number]. Topic: Clinical - Red Word Triage >> Sep 17, 2023  3:46 PM Precious C wrote: Red Word that prompted transfer to Nurse Triage: SEVERE PAIN  Patient called in reporting severe pain in her right leg, rating the pain as 7/10. She stated that she is unable to walk due to the pain and has taken ibuprofen , but it hasn't subsided much. Red word: SEVERE PAIN. Reason for Disposition  [1] MODERATE pain (e.g., interferes with normal activities, limping) AND [2] present > 3 days  Answer Assessment - Initial Assessment Questions 1. ONSET: When did the pain start?      Months but switched legs was in left now to right leg  2. LOCATION: Where is the pain located?      Right leg buttocks down to thigh 3. PAIN: How bad is the pain?    (Scale 1-10; or mild, moderate, severe)     7/10 4. WORK OR EXERCISE: Has there been any recent work or exercise that involved this part of the body?      no 5. CAUSE: What do you think is causing the leg pain?     ? sciatica 6. OTHER SYMPTOMS: Do you have any other symptoms? (e.g., chest pain, back pain, breathing difficulty, swelling, rash, fever, numbness, weakness)     limping  Protocols used: Leg Pain-A-AH

## 2023-09-18 ENCOUNTER — Ambulatory Visit
Admission: RE | Admit: 2023-09-18 | Discharge: 2023-09-18 | Disposition: A | Discharge status: Home / Self Care | Payer: Self-pay | Source: Ambulatory Visit | Attending: Physician Assistant | Admitting: Physician Assistant

## 2023-09-18 VITALS — BP 124/87 | HR 84 | Temp 98.5°F | Resp 18 | Wt 259.7 lb

## 2023-09-18 DIAGNOSIS — E1169 Type 2 diabetes mellitus with other specified complication: Secondary | ICD-10-CM | POA: Diagnosis not present

## 2023-09-18 DIAGNOSIS — M79604 Pain in right leg: Secondary | ICD-10-CM | POA: Diagnosis not present

## 2023-09-18 MED ORDER — PREDNISONE 20 MG PO TABS: 20.0000 mg | ORAL_TABLET | Freq: Every day | ORAL | 0 refills | Status: AC

## 2023-09-18 NOTE — ED Triage Notes (Signed)
 Pt presents c/o leg pain in right leg. Pt is not sure if the pain is a side effect of Metformin . Pt has taken a muscle relaxer and ibuprofen  for relief. Pt says it helped her with getting rest but the pain returned upon wake up.

## 2023-09-18 NOTE — ED Provider Notes (Addendum)
 [facility]    CSN: [id number] Arrival date & time: 09/18/23  1159      History   Chief Complaint Chief Complaint  Patient presents with   Leg Pain    Entered by patient    HPI Regina Watkins is a 38 y.o. female.   Patient here today for evaluation of right leg pain.  She reports that pain is located primarily to posterior upper right buttocks and down upper half of leg.  She does not report any pain to the lower half of her leg.  She is unsure of any injury.  She has tried taking ibuprofen  and muscle relaxer and this was somewhat helpful but pain does continue today.  She denies any back pain.  She has not any numbness or tingling.  The history is provided by the patient.  Leg Pain Associated symptoms: no back pain and no fever     Past Medical History:  Diagnosis Date   Anemia    Back pain    BV (bacterial vaginosis)    Diabetes (HCC)    Diabetes (HCC)    Ectopic pregnancy    Infection    UTI   No pertinent past medical history    Obesity    Prediabetes    Sleep apnea    Strep throat    Trichomonas     Patient Active Problem List   Diagnosis Date Noted   Elevated blood pressure reading without diagnosis of hypertension 03/19/2022   Class 3 severe obesity without serious comorbidity with body mass index (BMI) of 40.0 to 44.9 in adult 02/28/2022   Vitamin D  deficiency 02/28/2022   Abnormal uterine bleeding (AUB) 11/19/2021   OSA (obstructive sleep apnea) 11/16/2019   Family history of premature coronary heart disease 11/16/2019   Diabetes mellitus (HCC) 07/22/2019   Mastalgia 06/11/2011   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 Authorizing Provider  predniSONE  (DELTASONE ) 20 MG tablet Take 1 tablet (20 mg total) by mouth daily with breakfast for 5 days. 09/18/23 09/23/23 Yes Billy Asberry FALCON, PA-C  Blood Glucose Monitoring Suppl DEVI 1 each by Does not apply route in the morning, at noon, and at bedtime. May substitute to any manufacturer covered by patient's insurance. 03/19/23   Tanda Bleacher, MD  cyclobenzaprine  (FLEXERIL ) 10 MG tablet Take 1 tablet (10 mg total) by mouth 3 (three) times daily as needed for muscle spasms. 09/13/22   Zina Jerilynn LABOR, MD  metFORMIN  (GLUCOPHAGE -XR) 750 MG 24 hr tablet TAKE 1 TABLET BY MOUTH TWICE DAILY WITH A MEAL 07/14/23   Lorren Greig PARAS, NP  norethindrone  (AYGESTIN ) 5 MG tablet Take 1 tablet (5 mg total) by mouth daily. 03/13/23   Ajewole, Christana, MD  SUMAtriptan  (IMITREX ) 25 MG tablet Take 25 mg (1 tablet total) by mouth at the start of the headache. May repeat in 2 hours x 1 if headache persists. Max of 2 tablets/24 hours. 03/20/23   Lorren Greig PARAS, NP  terconazole  (TERAZOL 7 ) 0.4 % vaginal cream Place 1 applicator vaginally at bedtime. Use for seven days 03/14/23   Ajewole, Christana, MD  tirzepatide  (MOUNJARO ) 2.5 MG/0.5ML Pen Inject 2.5 mg into the skin once a week. 09/02/23   Delbert Clam, MD    Family History Family History  Problem Relation Age of Onset   Hypertension Mother    Thyroid disease Mother    Diabetes Mother    Cancer Mother    Heart disease Father        CHF   Hypertension Father    Sleep apnea Father    Alcoholism Father    Obesity Father    Diabetes Maternal Grandmother    Hypertension Maternal Grandmother    Cancer Maternal Grandmother        breast   Cancer Paternal Grandmother    Anesthesia problems Neg Hx    Hypotension Neg Hx    Malignant hyperthermia Neg Hx    Pseudochol  deficiency Neg Hx     Social History Social History   Tobacco Use   Smoking status: Never    Passive exposure: Never   Smokeless tobacco: Never  Vaping Use   Vaping status: Never Used  Substance Use Topics   Alcohol use: No    Comment: occ   Drug use: No     Allergies   Latex   Review of Systems Review of Systems  Constitutional:  Negative for chills and fever.  Eyes:  Negative for discharge, redness and visual disturbance.  Respiratory:  Negative for shortness of breath.   Gastrointestinal:  Negative for abdominal pain, nausea and vomiting.  Musculoskeletal:  Negative for back pain.  Neurological:  Negative for numbness.     Physical Exam Triage Vital Signs ED Triage Vitals  Encounter Vitals Group     BP --      Girls Systolic BP Percentile --      Girls Diastolic BP Percentile --      Boys Systolic BP Percentile --      Boys Diastolic BP Percentile --      Pulse --      Resp --      Temp --      Temp src --      SpO2 --      Weight 09/18/23 1224 259 lb 11.2 oz (117.8 kg)     Height --      Head Circumference --      Peak Flow --      Pain Score 09/18/23 1222 5     Pain Loc --      Pain Education --      Exclude from Growth Chart --    No data found.  Updated Vital Signs BP 124/87 (BP Location: Left Arm)   Pulse 84   Temp 98.5 F (36.9 C) (Oral)   Resp 18   Wt 259 lb 11.2 oz (117.8 kg)   LMP 08/28/2023 (Approximate)   SpO2 98%   BMI 41.92 kg/m   Visual Acuity Right Eye Distance:   Left Eye Distance:   Bilateral Distance:    Right Eye Near:   Left Eye Near:    Bilateral Near:     Physical Exam Vitals and nursing note  reviewed.  Constitutional:      General: She is not in acute distress.    Appearance: Normal appearance. She is not ill-appearing.  HENT:     Head: Normocephalic and atraumatic.  Eyes:     Conjunctiva/sclera: Conjunctivae normal.  Cardiovascular:     Rate and Rhythm: Normal rate.  Pulmonary:     Effort: Pulmonary  effort is normal. No respiratory distress.  Musculoskeletal:     Comments: Antalgic gait noted.  No tenderness palpation to midline thoracic or lumbar midline spine.  Neurological:     Mental Status: She is alert.  Psychiatric:        Mood and Affect: Mood normal.        Behavior: Behavior normal.        Thought Content: Thought content normal.      UC Treatments / Results  Labs (all labs ordered are listed, but only abnormal results are displayed) Labs Reviewed - No data to display  EKG   Radiology No results found.  Procedures Procedures (including critical care time)  Medications Ordered in UC Medications - No data to display  Initial Impression / Assessment and Plan / UC Course  I have reviewed the triage vital signs and the nursing notes.  Pertinent labs & imaging results that were available during my care of the patient were reviewed by me and considered in my medical decision making (see chart for details).    Steroid burst prescribed for suspected inflammatory cause of pain.  Discussed possible sciatica given location of pain.  Will treat with half dose steroid burst given known diabetes.  Last A1c 8.1.  Advised that prednisone  may elevate blood sugars and to monitor closely.  Recommended follow-up if no gradual improvement or with any further concerns.  Final Clinical Impressions(s) / UC Diagnoses   Final diagnoses:  Right leg pain  Type 2 diabetes mellitus with other specified complication, without long-term current use of insulin  [facility])   Discharge Instructions   None    ED Prescriptions     Medication Sig Dispense Auth. Provider   predniSONE  (DELTASONE ) 20 MG tablet Take 1 tablet (20 mg total) by mouth daily with breakfast for 5 days. 5 tablet Billy Asberry FALCON, PA-C      PDMP not reviewed this encounter.   Billy Asberry FALCON, PA-C 09/18/23 1354    Billy Asberry FALCON, PA-C 09/18/23 1354

## 2023-09-26 ENCOUNTER — Encounter: Admitting: Pharmacist

## 2023-10-06 ENCOUNTER — Other Ambulatory Visit (HOSPITAL_COMMUNITY)
Admission: RE | Admit: 2023-10-06 | Discharge: 2023-10-06 | Disposition: A | Discharge status: Home / Self Care | Source: Ambulatory Visit | Attending: Family Medicine | Admitting: Family Medicine

## 2023-10-06 ENCOUNTER — Ambulatory Visit: Admitting: *Deleted

## 2023-10-06 VITALS — BP 127/72 | HR 86 | Ht 66.0 in | Wt 255.5 lb

## 2023-10-06 DIAGNOSIS — Z202 Contact with and (suspected) exposure to infections with a predominantly sexual mode of transmission: Secondary | ICD-10-CM | POA: Insufficient documentation

## 2023-10-06 NOTE — Progress Notes (Signed)
 Here for self swab. She states she is not having symptoms but she likes to get checked every 6 months or so. She also reports she is supposed to use [facility] but had to use [facility] and hopes no issues. Self swab and blood obtained. Explained if positive she will be contacted. She voices understanding. Rock Skip PEAK

## 2023-10-07 ENCOUNTER — Telehealth: Payer: Self-pay | Admitting: Family Medicine

## 2023-10-07 LAB — CERVICOVAGINAL ANCILLARY ONLY
Bacterial Vaginitis (gardnerella): POSITIVE — AB
Candida Glabrata: POSITIVE — AB
Candida Vaginitis: NEGATIVE
Chlamydia: NEGATIVE
Comment: NEGATIVE
Comment: NEGATIVE
Comment: NEGATIVE
Comment: NEGATIVE
Comment: NEGATIVE
Comment: NORMAL
Neisseria Gonorrhea: NEGATIVE
Trichomonas: NEGATIVE

## 2023-10-07 LAB — HIV ANTIBODY (ROUTINE TESTING W REFLEX): HIV Screen 4th Generation wRfx: NONREACTIVE

## 2023-10-07 LAB — HEPATITIS B SURFACE ANTIGEN: Hepatitis B Surface Ag: NEGATIVE

## 2023-10-07 LAB — RPR: RPR Ser Ql: NONREACTIVE

## 2023-10-07 NOTE — Telephone Encounter (Signed)
 Confirmed appt for 8/7

## 2023-10-09 ENCOUNTER — Telehealth: Payer: Self-pay

## 2023-10-09 ENCOUNTER — Encounter: Payer: Self-pay | Admitting: Pharmacist

## 2023-10-09 ENCOUNTER — Telehealth: Payer: Self-pay | Admitting: Family Medicine

## 2023-10-09 ENCOUNTER — Ambulatory Visit: Attending: Family Medicine | Admitting: Pharmacist

## 2023-10-09 ENCOUNTER — Ambulatory Visit: Payer: Self-pay | Admitting: Family Medicine

## 2023-10-09 DIAGNOSIS — E1169 Type 2 diabetes mellitus with other specified complication: Secondary | ICD-10-CM

## 2023-10-09 DIAGNOSIS — Z7985 Long-term (current) use of injectable non-insulin antidiabetic drugs: Secondary | ICD-10-CM

## 2023-10-09 DIAGNOSIS — Z7984 Long term (current) use of oral hypoglycemic drugs: Secondary | ICD-10-CM

## 2023-10-09 DIAGNOSIS — B9689 Other specified bacterial agents as the cause of diseases classified elsewhere: Secondary | ICD-10-CM

## 2023-10-09 LAB — POCT GLYCOSYLATED HEMOGLOBIN (HGB A1C): HbA1c, POC (controlled diabetic range): 7.8 % — AB (ref 0.0–7.0)

## 2023-10-09 MED ORDER — METRONIDAZOLE 500 MG PO TABS
500.0000 mg | ORAL_TABLET | Freq: Two times a day (BID) | ORAL | 0 refills | Status: AC
Start: 2023-10-09 — End: ?

## 2023-10-09 MED ORDER — TIRZEPATIDE 5 MG/0.5ML ~~LOC~~ SOAJ
5.0000 mg | SUBCUTANEOUS | 2 refills | Status: AC
Start: 2023-10-09 — End: ?

## 2023-10-09 MED ORDER — METRONIDAZOLE 500 MG PO TABS: 500.0000 mg | ORAL_TABLET | Freq: Two times a day (BID) | ORAL | 0 refills | Status: DC

## 2023-10-09 MED ORDER — BORIC ACID 600 MG VA SUPP: 1.0000 | Freq: Every evening | VAGINAL | 0 refills | Status: AC

## 2023-10-09 NOTE — Telephone Encounter (Signed)
 Patient is calling to see if she is going to have any medicine sent to her pharmacy in regards to her last visit.

## 2023-10-09 NOTE — Addendum Note (Signed)
 Addended by: Monty Spicher L on: 10/09/2023 10:49 AM   Modules accepted: Orders

## 2023-10-09 NOTE — Progress Notes (Signed)
 S:     No chief complaint on file.  38 y.o. female who presents for diabetes evaluation, education, and management. Of note, she was enrolled in [facility] from 04/04/23 - 09/02/2023. We wanted to wait until 09/26/23 to get her A1c but she missed that appt. A1c at time of enrollment in [facility] was 9.1%.  Patient was referred and last seen by Primary Care Provider, Dr. Raguel Blush, on 03/27/2023. PMH is significant for T2DM, obstructive sleep apnea, obesity, abnormal uterine bleeding, mastalgia, ectopic pregnancy, vitamin D  deficiency. We saw her 04/04/23 and enrolled her in [facility].   At last pharmacy follow-up on 09/02/2023, I offered to increased her Mounjaro . She declined d/t GI side effects experienced with the 5 mg weekly dose. We discussed adding something like glipizide short term instead. She opted to continue metformin  and Mounjaro  and follow-up.   Today, patient arrives in good spirits and presents without any assistance. Her A1c today has improved to 7.8%. Commended her for this! She endorses adherence to the Mounjaro  and metformin . She endorses continued GI upset with metformin  and would like to come off the metformin . Denies any current vomiting or abdominal pain.   Family/Social History:  -diabetes -no family history of pancreatitis/thyroid cancer  Current diabetes medications include: metformin  750 mg BID, tirzepatide  2.5 mg subcutaneous weekly Patient reports adherence to taking all medications as prescribed.   Insurance coverage: [employer], [employer]  Patient denies hypoglycemic events.  Patient denies nocturia (nighttime urination).  Patient denies neuropathy (nerve pain). Patient denies visual changes. Patient denies polyphagia, polydipsia. Patient reports self foot exams.   Patient reported dietary habits: Eats 1-2 meals/day Snacks: fruit Drinks: shakes, drinks 3x large cups of water (Stanley cup)  Patient-reported exercise habits:  -Walks 5 days/week  at work -Works out at [employer] ~3 days a week (45 minutes at a time)  O:  Date of Download: 10/09/2023, 28-day download % Time CGM is active: 72% Average Glucose: 205 mg/dL Glucose Management Indicator: 8.2  Glucose Variability: 19.1 (goal <36%) Time in Goal:  - Time in range 70-180: 28% - Time above range: 72% - Time below range: 0%  Lab Results  Component Value Date   HGBA1C 7.8 (A) 10/09/2023   There were no vitals filed for this visit. \ Lipid Panel     Component Value Date/Time   CHOL 133 12/31/2021 1504   TRIG 164 (H) 12/31/2021 1504   HDL 36 (L) 12/31/2021 1504   CHOLHDL 3.7 12/31/2021 1504   LDLCALC 69 12/31/2021 1504    Clinical Atherosclerotic Cardiovascular Disease (ASCVD): No  The ASCVD Risk score (Arnett DK, et al., 2019) failed to calculate for the following reasons:   The 2019 ASCVD risk score is only valid for ages 39 to 45   Patient is participating in a Managed Medicaid Plan:  No   A/P Diabetes longstanding, currently above goal with A1C 7.8% (goal < 7%). However, this is improving and I have commended her for this! She had GI side effects with the 5mg  dose of Mounjaro  previously. However, we had a shared decision making conversation today. We will stop metformin  and proceed with Mounjaro  only. She is amenable to increase to 5 mg weekly to see if she tolerates this. Medication adherence appears appropriate.  -Increase Mounjaro  (generic tirzepatide ) to 5mg  once weekly.   -Discontinue metformin  750 mg BID -Patient educated on purpose, proper use, and potential adverse effects of tirzepatide .  -Extensively discussed pathophysiology of diabetes, recommended lifestyle interventions, dietary effects on  blood sugar control.  -Counseled on s/sx of and management of hypoglycemia.  -Next A1c anticipated 01/2024.   Written patient instructions provided. Patient verbalized understanding of treatment plan.  Total time in face to face counseling 30 minutes.     Follow-up:  Pharmacist in 1 month.  Herlene Fleeta Morris, PharmD, JAQUELINE, CPP Clinical Pharmacist [facility] [phone]

## 2023-10-09 NOTE — Telephone Encounter (Signed)
 Pt called for clarification on her medications that were prescribed. She wanted to know if it was okay to buy borric acid over the counter and if it was the same as it was prescribed. Clarified that it was okay to buy over the counter and reassured her that it was a vaginal suppository. Pt denies any further questions or concerns.   Cyndee Molt, RN

## 2023-10-09 NOTE — Telephone Encounter (Signed)
 Patient also left a message on nurse line stating she was looking on Mychart at results and sees she has BV andy maybe yeast and wants to know if someone is sending in treatment. I called her and informed her provider has not yet reviewed but I can see results and can send in treatment for BV- she prefers Flagyl  po. I also informed her she has candida glabrata and I am going to send message to provider to decide on treatment so she will hear about that later. She prefers a pill if possible. Rock Skip PEAK

## 2023-10-13 ENCOUNTER — Ambulatory Visit: Payer: Self-pay | Admitting: Family Medicine

## 2023-10-23 ENCOUNTER — Ambulatory Visit: Payer: Self-pay

## 2023-10-23 NOTE — Telephone Encounter (Signed)
 Pt scheduled for follow up with provider on 10/06. Recommended MU with [facility] Sage, gave pt location and hours for Monday and Tuesday. Pt agreed, and will follow up with PCP on her scheduled appt

## 2023-10-23 NOTE — Telephone Encounter (Signed)
 FYI Only or Action Required?: Action required by provider: request for appointment.  Patient was last seen in primary care on 03/27/2023 by Tanda Bleacher, MD.  Called Nurse Triage reporting intermittent Tingling and numbness to both hands and both feet.  Symptoms began about a month ago.  Interventions attempted: Nothing.  Symptoms are: gradually worsening.  Triage Disposition: See PCP When Office is Open (Within 3 Days)  Patient/caregiver understands and will follow disposition?: yes but no appts that she can do today - no appts until the 28th- supposed to see PCP within 3 days  [place] but refused. Called pt and advised sending to office to see if can work in.          Copied from CRM #[id number]. Topic: Clinical - Red Word Triage >> Oct 23, 2023 11:46 AM Myrick T wrote: [facility] that prompted transfer to Nurse Triage: tingling and numbness in the bottom on her feet going up her legs. She also has tingling in her hands/arms. She also has joint pain in her legs Reason for Disposition  [1] Numbness or tingling in one or both hands AND [2] is a chronic symptom (recurrent or ongoing AND present > 4 weeks)  [1] Numbness or tingling in one or both feet AND [2] is a chronic symptom (recurrent or ongoing AND present > 4 weeks)  Answer Assessment - Initial Assessment Questions 1. SYMPTOM: What is the main symptom you are concerned about? (e.g., weakness, numbness)     Numbness and tingling  2. ONSET: When did this start? (e.g., minutes, hours, days; while sleeping)   One   Month    leg 2-3 month     numbness and tingling  to hands and feet/feet cold  4. PATTERN Does this come and go, or has it been constant since it started?  Is it present now?     Comes and goes 5. CARDIAC SYMPTOMS: Have you had any of the following symptoms: chest pain, difficulty breathing, palpitations?     Says HR is elevated in the am's only not issue during the day  6. NEUROLOGIC SYMPTOMS:  Have you had any of the following symptoms: headache, dizziness, vision loss, double vision, changes in speech, unsteady on your feet?     Numbness and tingling to bilateral hands and feet  7. OTHER SYMPTOMS: Do you have any other symptoms?     Leg pain  Protocols used: Neurologic Deficit-A-AH

## 2023-10-28 ENCOUNTER — Encounter: Payer: Self-pay | Admitting: Physician Assistant

## 2023-10-28 ENCOUNTER — Ambulatory Visit: Admitting: Physician Assistant

## 2023-10-28 VITALS — BP 116/77 | HR 86

## 2023-10-28 DIAGNOSIS — E559 Vitamin D deficiency, unspecified: Secondary | ICD-10-CM | POA: Diagnosis not present

## 2023-10-28 DIAGNOSIS — E1169 Type 2 diabetes mellitus with other specified complication: Secondary | ICD-10-CM | POA: Diagnosis not present

## 2023-10-28 DIAGNOSIS — G629 Polyneuropathy, unspecified: Secondary | ICD-10-CM

## 2023-10-28 LAB — GLUCOSE, POCT (MANUAL RESULT ENTRY): POC Glucose: 231 mg/dL — AB (ref 70–99)

## 2023-10-28 MED ORDER — GABAPENTIN 100 MG PO CAPS: 100.0000 mg | ORAL_CAPSULE | Freq: Two times a day (BID) | ORAL | 0 refills | Status: AC

## 2023-10-28 NOTE — Progress Notes (Signed)
 Established Patient Office Visit  Subjective   Patient ID: Regina Watkins, female    DOB: 1985/12/15  Age: 38 y.o. MRN: [id number]  Chief Complaint  Patient presents with   Numbness    In feet and legs no swelling    Discussed the use of AI scribe software for clinical note transcription with the patient, who gave verbal consent to proceed.  History of Present Illness   Regina Watkins is a 38 year old female with diabetes who presents with numbness and tingling in her feet, legs, and arms.  Numbness and tingling in the feet, legs, and arms have been present for about a month. Joint pain in the legs, described as achy and stiff, has persisted for several months and is consistent throughout the day. She works at [employer], standing on hard floors, and suspects her footwear may contribute to her discomfort.  She has diabetes and is currently on Mounjaro , with previous use of metformin  (is finishing her last prescription of it). Her blood sugar levels have improved significantly since starting Mounjaro , with a notable decrease in A1c from 10.6. She experiences increased urination and cold feet, raising concerns about possible anemia or calcium deficiency.  She has a history of vitamin D  deficiency and does not take a multivitamin.      Past Medical History:  Diagnosis Date   Anemia    Back pain    BV (bacterial vaginosis)    Diabetes (HCC)    Diabetes (HCC)    Ectopic pregnancy    Infection    UTI   No pertinent past medical history    Obesity    Prediabetes    Sleep apnea    Strep throat    Trichomonas    Social History   Socioeconomic History   Marital status: Single    Spouse name: Not on file   Number of children: Not on file   Years of education: Not on file   Highest education level: Not on file  Occupational History   Occupation: Statistician   Occupation: special needs at a day program  Tobacco Use   Smoking status: Never    Passive exposure: Never    Smokeless tobacco: Never  Vaping Use   Vaping status: Never Used  Substance and Sexual Activity   Alcohol use: No    Comment: occ   Drug use: No   Sexual activity: Yes    Birth control/protection: None    Comment: monogamous rlship wiht female partner of 8 years; last intercourse one month ago  Other Topics Concern   Not on file  Social History Narrative   Not on file   Social Drivers of Health   Financial Resource Strain: Low Risk  (03/27/2023)   Overall Financial Resource Strain (CARDIA)    Difficulty of Paying Living Expenses: Not hard at all  Food Insecurity: No Food Insecurity (11/13/2022)   Hunger Vital Sign    Worried About Running Out of Food in the Last Year: Never true    Ran Out of Food in the Last Year: Never true  Transportation Needs: No Transportation Needs (11/13/2022)   PRAPARE - Administrator, Civil Service (Medical): No    Lack of Transportation (Non-Medical): No  Physical Activity: Sufficiently Active (03/27/2023)   Exercise Vital Sign    Days of Exercise per Week: 7 days    Minutes of Exercise per Session: 30 min  Stress: No Stress Concern Present (03/27/2023)   [employer]  of Occupational Health - Occupational Stress Questionnaire    Feeling of Stress : Only a little  Social Connections: Moderately Isolated (03/27/2023)   Social Connection and Isolation Panel    Frequency of Communication with Friends and Family: More than three times a week    Frequency of Social Gatherings with Friends and Family: More than three times a week    Attends Religious Services: More than 4 times per year    Active Member of [employer] or Organizations: No    Attends Banker Meetings: Never    Marital Status: Never married  Intimate Partner Violence: Not At Risk (03/27/2023)   Humiliation, Afraid, Rape, and Kick questionnaire    Fear of Current or Ex-Partner: No    Emotionally Abused: No    Physically Abused: No    Sexually Abused: No   Family  History  Problem Relation Age of Onset   Hypertension Mother    Thyroid disease Mother    Diabetes Mother    Cancer Mother    Heart disease Father        CHF   Hypertension Father    Sleep apnea Father    Alcoholism Father    Obesity Father    Diabetes Maternal Grandmother    Hypertension Maternal Grandmother    Cancer Maternal Grandmother        breast   Cancer Paternal Grandmother    Anesthesia problems Neg Hx    Hypotension Neg Hx    Malignant hyperthermia Neg Hx    Pseudochol deficiency Neg Hx    Allergies  Allergen Reactions   Latex Other (See Comments)    Latex condoms cause bacterial infections and irritation.   Metformin  And Related Nausea And Vomiting    Review of Systems  Constitutional: Negative.   HENT: Negative.    Eyes: Negative.   Respiratory:  Negative for shortness of breath.   Cardiovascular:  Negative for chest pain.  Gastrointestinal: Negative.   Genitourinary: Negative.   Musculoskeletal:  Positive for myalgias.  Skin: Negative.   Neurological: Negative.   Endo/Heme/Allergies: Negative.   Psychiatric/Behavioral: Negative.        Objective:     BP 116/77 (BP Location: Left Arm, Patient Position: Sitting, Cuff Size: Large)   Pulse 86   SpO2 97%  BP Readings from Last 3 Encounters:  10/28/23 116/77  10/06/23 127/72  09/18/23 124/87   Wt Readings from Last 3 Encounters:  10/06/23 255 lb 8 oz (115.9 kg)  09/18/23 259 lb 11.2 oz (117.8 kg)  09/02/23 259 lb 12.8 oz (117.8 kg)    Physical Exam Vitals and nursing note reviewed.  Constitutional:      Appearance: Normal appearance.  HENT:     Head: Normocephalic and atraumatic.     Right Ear: External ear normal.     Left Ear: External ear normal.     Nose: Nose normal.     Mouth/Throat:     Mouth: Mucous membranes are moist.     Pharynx: Oropharynx is clear.  Eyes:     Extraocular Movements: Extraocular movements intact.     Conjunctiva/sclera: Conjunctivae normal.     Pupils:  Pupils are equal, round, and reactive to light.  Cardiovascular:     Rate and Rhythm: Normal rate and regular rhythm.     Pulses: Normal pulses.          Dorsalis pedis pulses are 2+ on the right side and 2+ on the left side.  Posterior tibial pulses are 2+ on the right side and 2+ on the left side.     Heart sounds: Normal heart sounds.  Pulmonary:     Effort: Pulmonary effort is normal.     Breath sounds: Normal breath sounds.  Musculoskeletal:        General: Normal range of motion.     Cervical back: Normal range of motion and neck supple.     Right lower leg: No edema.     Left lower leg: No edema.  Skin:    General: Skin is warm and dry.  Neurological:     General: No focal deficit present.     Mental Status: She is alert and oriented to person, place, and time.  Psychiatric:        Mood and Affect: Mood normal.        Behavior: Behavior normal.        Thought Content: Thought content normal.        Judgment: Judgment normal.        Assessment & Plan:   Problem List Items Addressed This Visit       Endocrine   Diabetes mellitus (HCC) - Primary   Relevant Orders   POCT glucose (manual entry) (Completed)   CBC with Differential/Platelet   Comp. Metabolic Panel (12)     Other   Vitamin D  deficiency   Relevant Orders   Vitamin D , 25-hydroxy   Other Visit Diagnoses       Neuropathy       Relevant Medications   gabapentin  (NEURONTIN ) 100 MG capsule   Other Relevant Orders   CBC with Differential/Platelet   Comp. Metabolic Panel (12)      1. Type 2 diabetes mellitus with other specified complication, without long-term current use of insulin  (HCC) (Primary) Continue current regimen - POCT glucose (manual entry) - CBC with Differential/Platelet - Comp. Metabolic Panel (12)  2. Neuropathy Trial gabapentin , patient education given on supportive care, red flags given for prompt reevaluation.  Patient has appointment [facility].  Patient encouraged to return to mobile unit prior if needed - CBC with Differential/Platelet - Comp. Metabolic Panel (12) - gabapentin  (NEURONTIN ) 100 MG capsule; Take 1 capsule (100 mg total) by mouth 2 (two) times daily.  Dispense: 60 capsule; Refill: 0  3. Vitamin D  deficiency  - Vitamin D , 25-hydroxy   I have reviewed the patient's medical history (PMH, PSH, Social History, Family History, Medications, and allergies) , and have been updated if relevant. I spent 30 minutes reviewing chart and  face to face time with patient.    Return in about 2 weeks (around 11/11/2023).    Kirk RAMAN Mayers, PA-C

## 2023-10-28 NOTE — Patient Instructions (Addendum)
 VISIT SUMMARY:  Today, we discussed your ongoing issues with numbness, tingling, and joint pain. We reviewed your diabetes management and noted improvements in your blood sugar levels. We also addressed your concerns about possible anemia and calcium deficiency  You have an appointment to follow-up [facility] in approximately 1 month (9/22) , please feel free to return to the mobile unit prior to that if needed  YOUR PLAN:  -TYPE 2 DIABETES MELLITUS WITH DIABETIC NEUROPATHY: Diabetic neuropathy is nerve damage caused by prolonged high blood sugar levels, leading to numbness and tingling in your extremities. We will start you on gabapentin , which you should take once at bedtime and increase to twice daily if needed. Continue taking Mounjaro  to manage your blood sugar levels. We will also conduct lab tests to check your kidney function, sodium, calcium, hemoglobin, and vitamin D  levels.  -LEG PAIN AND STIFFNESS: Your chronic leg pain and stiffness may be due to prolonged standing at work, but it could also be related to neuropathy, vitamin D  deficiency, or an electrolyte imbalance. We will perform lab tests to check your vitamin D  levels and electrolytes. In the meantime, I recommend doing stretching exercises in the morning and before bed. You can also add electrolytes to your water intake, using products like liquid IV or a pinch of salt.  -VITAMIN D  DEFICIENCY: Vitamin D  deficiency can cause musculoskeletal symptoms like pain and stiffness. We will re-evaluate your vitamin D  levels with a lab test and consider supplementation based on the results.  Nerve Damage (Peripheral Neuropathy): What to Know Peripheral neuropathy happens when there's damage to the peripheral nerves. These nerves send signals between your spinal cord and your arms and legs. You can have damage in one or more nerves. What are the causes? There are many reasons why  nerves can get damaged. Damage may be caused by some diseases, such as: Diabetes. This is the most common cause. Autoimmune diseases, such as rheumatoid arthritis or lupus. These happen when your body's defense system (immune system) attacks healthy parts of your body by mistake. Inherited nerve disease. This is passed down in families. Kidney disease. Thyroid disease. Other causes include: Pressure or stress on a nerve that lasts a long time. Lack of B vitamins from poor diet or drinking too much alcohol. Infections. Some medicines, like chemotherapy used for cancer treatment. Poisonous (toxic) substances, such as lead or mercury. Too little blood flowing to the legs. Sometimes, the cause isn't known. What are the signs or symptoms? Symptoms depend on which nerves are damaged. In your legs, hands, or arms, you may have: Loss of feeling (numbness). Tingling. Burning pain. Very sensitive skin. Weakness. Paralysis. This is when you can't move a part of your body. Clumsiness. Muscle twitching. Loss of balance. You may have trouble walking. Other symptoms can include: Not being able to control when you pee. Feeling dizzy. Problems during sex. How is this diagnosed? Your health care provider will: Ask about your symptoms and medical history. Do a physical exam. Do a neurological exam. They will check your reflexes, how you move, and what you can feel. You may need to have other tests, such as: Blood tests. Nerve tests to check how well your nerves and muscles work. Imaging tests, such as a CT scan or MRI. These are done to rule out other causes. Nerve biopsy. This is when a small piece of a nerve is removed for testing. Lumbar puncture. A small amount  of the fluid that surrounds the brain and spinal cord is taken and checked in a lab. How is this treated? Treatment may include: Treating the cause of the nerve damage. Pain medicines. Other medicines that may help with pain,  such as: Medicines that treat seizures. Medicines that treat depression. Pain patches or creams that are put on painful areas of skin. TENS therapy. This uses a device that sends mild electrical pulses to your nerves. This will prevent pain signals from reaching the brain. Massage. Acupuncture. Surgery to relieve pressure on a nerve or to destroy a nerve that's causing pain. This is done only if the other treatments are not helping. You may also need physical therapy to help improve your movement, balance, and muscle strength. You may be told to use a cane or walker if needed. Follow these instructions at home: Medicines Take your medicines only as told. You may need to take steps to help treat or prevent trouble pooping (constipation), such as: Taking medicines to help you poop. Eating foods high in fiber, like beans, whole grains, and fresh fruits and vegetables. Drinking more fluids as told. Ask your provider if it's safe to drive or use machines while taking your medicine. Lifestyle  Do not smoke, vape, or use nicotine or tobacco. Avoid or limit alcohol. Too much alcohol can be harmful to nerves and cause damage. Eat a healthy diet. Safety  Take care to avoid burning or damaging your skin if you have numbness. If you have numbness in your feet: Check your feet each day for signs of injury or infection. Watch for redness, warmth, and swelling. Wear padded socks and comfortable shoes. These help protect your feet. General instructions If you have diabetes, keep your blood sugar under control. Develop a good support system. Living with nerve damage can cause a lot of stress. Talk with a mental health therapist or join a support group. Exercise as told. Ask what things are safe for you to do at home. Work with a pain specialist to find the best way to manage your pain. Keep all follow-up visits. Your provider will check if the treatments are working and change them if needed. Where  to find support The Foundation for Peripheral Neuropathy: BudgetManiac.si Where to find more information To learn more, go to: [employer] of Neurological Disorders and Stroke at BasicFM.no. Click Search and type peripheral neuropathy. Find the link you need. Contact a health care provider if: Your pain treatments are not working. Your symptoms are getting worse. You have side effects from any of your medicines. You have new symptoms. You're having a hard time coping with your condition. Get help right away if: You have a wound that's not healing. You have new weakness in an arm or leg. You've fallen or you fall often. This information is not intended to replace advice given to you by your health care provider. Make sure you discuss any questions you have with your health care provider. Document Revised: 05/15/2023 Document Reviewed: 05/15/2023 Elsevier Patient Education  2025 [employer].

## 2023-10-28 NOTE — Progress Notes (Signed)
23.1%

## 2023-10-31 ENCOUNTER — Encounter: Payer: Self-pay | Admitting: Physician Assistant

## 2023-11-05 ENCOUNTER — Ambulatory Visit: Payer: Self-pay | Admitting: Physician Assistant

## 2023-11-05 DIAGNOSIS — E1169 Type 2 diabetes mellitus with other specified complication: Secondary | ICD-10-CM

## 2023-11-05 LAB — CBC WITH DIFFERENTIAL/PLATELET

## 2023-11-05 LAB — COMP. METABOLIC PANEL (12)
AST: 15 IU/L (ref 0–40)
Albumin: 4.2 g/dL (ref 3.9–4.9)
Alkaline Phosphatase: 75 IU/L (ref 44–121)
BUN/Creatinine Ratio: 16 (ref 9–23)
BUN: 12 mg/dL (ref 6–20)
Bilirubin Total: 1 mg/dL (ref 0.0–1.2)
Calcium: 9.4 mg/dL (ref 8.7–10.2)
Chloride: 100 mmol/L (ref 96–106)
Creatinine, Ser: 0.76 mg/dL (ref 0.57–1.00)
Globulin, Total: 3 g/dL (ref 1.5–4.5)
Glucose: 154 mg/dL — ABNORMAL HIGH (ref 70–99)
Potassium: 4.4 mmol/L (ref 3.5–5.2)
Sodium: 142 mmol/L (ref 134–144)
Total Protein: 7.2 g/dL (ref 6.0–8.5)
eGFR: 103 mL/min/1.73 (ref >59)

## 2023-11-05 LAB — VITAMIN D 25 HYDROXY (VIT D DEFICIENCY, FRACTURES): Vit D, 25-Hydroxy: 22.7 ng/mL — ABNORMAL LOW (ref 30.0–100.0)

## 2023-11-11 ENCOUNTER — Telehealth: Payer: Self-pay | Admitting: Physician Assistant

## 2023-11-11 NOTE — Telephone Encounter (Signed)
 Contacted pt to schedule follow up visit. Pt is scheduled for 9/10

## 2023-11-12 ENCOUNTER — Ambulatory Visit: Admitting: Physician Assistant

## 2023-11-12 ENCOUNTER — Encounter: Payer: Self-pay | Admitting: Physician Assistant

## 2023-11-12 VITALS — BP 133/79 | HR 95 | Ht 66.0 in | Wt 248.0 lb

## 2023-11-12 DIAGNOSIS — Z13220 Encounter for screening for lipoid disorders: Secondary | ICD-10-CM

## 2023-11-12 DIAGNOSIS — E1169 Type 2 diabetes mellitus with other specified complication: Secondary | ICD-10-CM | POA: Diagnosis not present

## 2023-11-13 NOTE — Progress Notes (Signed)
 Patient ID: Regina Watkins, female   DOB: 1986-01-20, 38 y.o.   MRN: [id number] Nurse visit

## 2023-11-18 ENCOUNTER — Other Ambulatory Visit: Payer: Self-pay | Admitting: Obstetrics and Gynecology

## 2023-11-18 DIAGNOSIS — N921 Excessive and frequent menstruation with irregular cycle: Secondary | ICD-10-CM

## 2023-11-20 ENCOUNTER — Encounter: Payer: Self-pay | Admitting: Obstetrics and Gynecology

## 2023-11-20 ENCOUNTER — Ambulatory Visit: Payer: Self-pay | Admitting: Obstetrics and Gynecology

## 2023-11-20 ENCOUNTER — Other Ambulatory Visit (HOSPITAL_COMMUNITY)
Admission: RE | Admit: 2023-11-20 | Discharge: 2023-11-20 | Disposition: A | Discharge status: Home / Self Care | Source: Ambulatory Visit | Attending: Obstetrics and Gynecology | Admitting: Obstetrics and Gynecology

## 2023-11-20 ENCOUNTER — Other Ambulatory Visit: Payer: Self-pay

## 2023-11-20 VITALS — BP 137/87 | HR 98 | Wt 250.0 lb

## 2023-11-20 DIAGNOSIS — Z1331 Encounter for screening for depression: Secondary | ICD-10-CM | POA: Diagnosis not present

## 2023-11-20 DIAGNOSIS — N898 Other specified noninflammatory disorders of vagina: Secondary | ICD-10-CM | POA: Insufficient documentation

## 2023-11-20 DIAGNOSIS — N921 Excessive and frequent menstruation with irregular cycle: Secondary | ICD-10-CM | POA: Diagnosis not present

## 2023-11-20 DIAGNOSIS — N979 Female infertility, unspecified: Secondary | ICD-10-CM

## 2023-11-21 LAB — CERVICOVAGINAL ANCILLARY ONLY
Bacterial Vaginitis (gardnerella): NEGATIVE
Candida Glabrata: POSITIVE — AB
Candida Vaginitis: NEGATIVE
Comment: NEGATIVE
Comment: NEGATIVE
Comment: NEGATIVE

## 2023-11-24 ENCOUNTER — Telehealth: Payer: Self-pay | Admitting: Family Medicine

## 2023-11-24 ENCOUNTER — Ambulatory Visit: Payer: Self-pay | Admitting: Pharmacist

## 2023-11-24 ENCOUNTER — Other Ambulatory Visit: Payer: Self-pay

## 2023-11-24 ENCOUNTER — Ambulatory Visit: Payer: Self-pay | Admitting: Obstetrics and Gynecology

## 2023-11-24 DIAGNOSIS — B379 Candidiasis, unspecified: Secondary | ICD-10-CM

## 2023-11-24 MED ORDER — BORIC ACID CRYS: 600.0000 mg | CRYSTALS | Freq: Every day | 0 refills | Status: DC

## 2023-11-24 MED ORDER — BORIC ACID CRYS: 600.0000 mg | CRYSTALS | Freq: Every day | 0 refills | Status: AC

## 2023-11-24 NOTE — Telephone Encounter (Signed)
 Patient is calling to see If she is going to be having medicine sent to her pharmacy for her yeast infection. She said she has not gotten any messages in her mychart since she received her test results last week.

## 2023-11-24 NOTE — Progress Notes (Signed)
 GYNECOLOGY VISIT  Patient name: Regina Watkins MRN [id number]  Date of birth: 1986-02-28 Chief Complaint:   fibroid concerns  History:  Discussed the use of AI scribe software for clinical note transcription with the patient, who gave verbal consent to proceed.  History of Present Illness Regina Watkins is a 38 year old female with diabetes who presents with leg numbness and joint pain.  She experiences persistent joint pain and numbness in her leg. Previous evaluations for fibroids have been negative. She has undergone testing for cholesterol and vitamin D  deficiency and is currently taking vitamin D  supplements. She is due for a retest for anemia.  She has a history of menstrual irregularities and is taking norethindrone  (Aygestin ) to manage her periods, resulting in periods every three months with associated cramping and headaches.  She has a history of an ectopic pregnancy treated at [facility] in 2004 or 2005. She wants to conceive and is interested in checking the patency of her fallopian tubes, as they have not been checked since the ectopic pregnancy.  She experiences recurrent yeast infections since becoming diabetic, attributing them to her diabetes. She uses boric acid and antifungal medications for management. A recent infection was caused by Candida glabrata.  She has nipple piercings that occasionally snag and bleed, attributed to the piercings themselves. A mammogram in 2023 was clear. No new breast lumps, cysts, or changes in skin or nipple discharge aside from bleeding due to nipple piercings.   The following portions of the patient's history were reviewed and updated as appropriate: allergies, current medications, past family history, past medical history, past social history, past surgical history and problem list.   Health Maintenance:   Last pap     Component Value Date/Time   DIAGPAP  06/13/2021 1514    - Negative for intraepithelial lesion or  malignancy (NILM)   DIAGPAP  04/13/2019 0939    - Negative for intraepithelial lesion or malignancy (NILM)   HPVHIGH Negative 04/13/2019 0939   ADEQPAP  06/13/2021 1514    Satisfactory for evaluation; transformation zone component PRESENT.   ADEQPAP  04/13/2019 0939    Satisfactory for evaluation; transformation zone component ABSENT.    Health Maintenance  Topic Date Due   Eye exam for diabetics  Never done   Hepatitis B Vaccine (1 of 3 - 19+ 3-dose series) Never done   HPV Vaccine (1 - 3-dose SCDM series) Never done   Complete foot exam   06/07/2021   COVID-19 Vaccine (1 - 2024-25 season) Never done   Pneumococcal Vaccine (1 of 2 - PCV) 03/26/2024*   Yearly kidney health urinalysis for diabetes  03/19/2024   Hemoglobin A1C  04/10/2024   Yearly kidney function blood test for diabetes  10/27/2024   DTaP/Tdap/Td vaccine (2 - Td or Tdap) 03/04/2026   Pap with HPV screening  06/14/2026   Hepatitis C Screening  Completed   HIV Screening  Completed   Meningitis B Vaccine  Aged Out   Flu Shot  Discontinued  *Topic was postponed. The date shown is not the original due date.    Review of Systems:  Pertinent items are noted in HPI. Comprehensive review of systems was otherwise negative.   Objective:  Physical Exam BP 137/87   Pulse 98   Wt 250 lb (113.4 kg)   BMI 40.35 kg/m    Physical Exam Vitals and nursing note reviewed.  Constitutional:      Appearance: Normal appearance.  HENT:  Head: Normocephalic and atraumatic.  Pulmonary:     Effort: Pulmonary effort is normal.  Skin:    General: Skin is warm and dry.  Neurological:     General: No focal deficit present.     Mental Status: She is alert.  Psychiatric:        Mood and Affect: Mood normal.        Behavior: Behavior normal.        Thought Content: Thought content normal.        Judgment: Judgment normal.        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 Persistent symptoms despite previous treatments. Boric acid treatment ongoing. - Continue boric acid treatment for 14 days. - Consider over-the-counter boric acid if needed.  Bacterial vaginosis Likely related to changes in soaps and personal care products. - Perform swab for bacterial vaginosis and yeast.  Menstrual irregularity on norethindrone  therapy Menstrual irregularity managed with norethindrone , significant improvement in symptoms. Minimal menstruation with occasional cramping and headaches. Satisfied with management. - Continue norethindrone  therapy. - Ensure prescription refill for norethindrone  to cover the next year.  History of ectopic pregnancy with unilateral salpingectomy, fertility evaluation History of ectopic pregnancy with unilateral salpingectomy. Desires fertility evaluation to assess patency of remaining fallopian tube. - Order hysterosalpingogram (HSG) to evaluate patency of remaining fallopian tube.  Type 2 diabetes mellitus Recent improvement in A1c, leading to discontinuation of medication. Recurrent yeast infections potentially related to diabetes management. Lower A1c levels beneficial for surgical outcomes if hysterectomy considered. - Monitor A1c levels, with upcoming appointment on September 22. - Discuss potential impact of A1c levels on surgical outcomes if hysterectomy is considered in the future.  Joint pain and lower extremity numbness, under evaluation Joint pain and lower extremity numbness under evaluation. Previous tests for fibroids negative. Unlikely fibroids causing symptoms due to lack of evidence on prior imaging  .  Carter Quarry, MD [facility] for [facility], [facility]

## 2023-11-28 ENCOUNTER — Ambulatory Visit: Admitting: Family Medicine

## 2023-11-28 ENCOUNTER — Ambulatory Visit (HOSPITAL_COMMUNITY)
Admission: RE | Admit: 2023-11-28 | Discharge: 2023-11-28 | Disposition: A | Discharge status: Home / Self Care | Source: Ambulatory Visit | Attending: Obstetrics and Gynecology | Admitting: Obstetrics and Gynecology

## 2023-11-28 DIAGNOSIS — N921 Excessive and frequent menstruation with irregular cycle: Secondary | ICD-10-CM | POA: Diagnosis present

## 2023-12-08 ENCOUNTER — Ambulatory Visit: Admitting: Family Medicine

## 2023-12-26 ENCOUNTER — Ambulatory Visit: Attending: Family Medicine | Admitting: Pharmacist

## 2023-12-26 ENCOUNTER — Encounter: Payer: Self-pay | Admitting: Pharmacist

## 2023-12-26 DIAGNOSIS — Z7985 Long-term (current) use of injectable non-insulin antidiabetic drugs: Secondary | ICD-10-CM | POA: Diagnosis not present

## 2023-12-26 DIAGNOSIS — E1169 Type 2 diabetes mellitus with other specified complication: Secondary | ICD-10-CM

## 2023-12-26 NOTE — Progress Notes (Signed)
 S:     No chief complaint on file.  38 y.o. female who presents for diabetes evaluation, education, and management. Of note, she was enrolled in [employer] from 04/04/23 - 09/02/2023. Last A1c was taken 10/09/2023. Was 7.8, down from 8.1 prior.   Patient was referred and last seen by Primary Care Provider, Dr. Raguel Blush, on 03/27/2023. PMH is significant for T2DM, obstructive sleep apnea, obesity, abnormal uterine bleeding, mastalgia, ectopic pregnancy, vitamin D  deficiency.   At last pharmacy follow-up on 10/09/2023, I increased her Mounjaro  to 5 mg weekly and discontinued metformin . Of note, she was seen at our mobile clinic a couple of times between now and then with most recent visit 11/12/2023. She was seen for neuropathy. Also had labs drawn that showed vitamin D  deficiency. Of note, since she's been taking OTC vitamin D , symptoms have improved. However, it was recommended she recheck labs today.   Today, patient arrives in good spirits and presents without any assistance. She endorses adherence to the Mounjaro  but still takes the 2.5 mg pen. She has 1 more injection before increasing to the 5 mg weekly dose. She denies any current vomiting or abdominal pain. Will experience nausea for a couple of days after injection but this resolved. She wishes to continue Mounjaro  for now.   Family/Social History:  -diabetes -no family history of pancreatitis/thyroid cancer  Current diabetes medications include: tirzepatide  2.5 mg subcutaneous weekly Patient reports adherence to taking all medications as prescribed.   Insurance coverage: [employer], [employer]  Patient denies hypoglycemic events.  Patient denies nocturia (nighttime urination).  Patient denies neuropathy (nerve pain). Patient denies visual changes. Patient denies polyphagia, polydipsia. Patient reports self foot exams.   Patient reported dietary habits: Eats 1-2 meals/day Snacks: fruit Drinks: shakes, drinks 3x large  cups of water (Stanley cup)  Patient-reported exercise habits:  -Walks 5 days/week at work -Works out at [employer] ~3 days a week (45 minutes at a time)  Patient-reported home sugars:  -Mostly low 100s. Does not see > 200 mg/dL.   O: Lab Results  Component Value Date   HGBA1C 7.8 (A) 10/09/2023   There were no vitals filed for this visit. \ Lipid Panel     Component Value Date/Time   CHOL 133 12/31/2021 1504   TRIG 164 (H) 12/31/2021 1504   HDL 36 (L) 12/31/2021 1504   CHOLHDL 3.7 12/31/2021 1504   LDLCALC 69 12/31/2021 1504    Clinical Atherosclerotic Cardiovascular Disease (ASCVD): No  The ASCVD Risk score (Arnett DK, et al., 2019) failed to calculate for the following reasons:   The 2019 ASCVD risk score is only valid for ages 34 to 35   Patient is participating in a Managed Medicaid Plan:  No   A/P Diabetes longstanding, currently above goal with A1C 7.8% (goal < 7%). However, this is improving and I have commended her for this! She had GI side effects with the 5mg  dose of Mounjaro  previously. However, we had a shared decision making conversation today. We will continue with Mounjaro . She is amenable to increase to 5 mg weekly to see if she tolerates this. Medication adherence appears appropriate.  -Increase Mounjaro  (generic tirzepatide ) to 5mg  once weekly once she completes her current stock of 2.5 mg weekly.   -Patient educated on purpose, proper use, and potential adverse effects of tirzepatide .  -Extensively discussed pathophysiology of diabetes, recommended lifestyle interventions, dietary effects on blood sugar control.  -Counseled on s/sx of and management of hypoglycemia.  -Next  A1c anticipated 01/2024.   Written patient instructions provided. Patient verbalized understanding of treatment plan.  Total time in face to face counseling 30 minutes.    Follow-up:  Pharmacist in 1 month.  Herlene Fleeta Morris, PharmD, JAQUELINE, CPP Clinical Pharmacist [facility]  & [facility] [phone]

## 2023-12-27 LAB — CBC WITH DIFFERENTIAL/PLATELET
Basophils Absolute: 0 x10E3/uL (ref 0.0–0.2)
Basos: 1 %
EOS (ABSOLUTE): 0.3 x10E3/uL (ref 0.0–0.4)
Eos: 3 %
Hematocrit: 44.5 % (ref 34.0–46.6)
Hemoglobin: 14.2 g/dL (ref 11.1–15.9)
Immature Grans (Abs): 0 x10E3/uL (ref 0.0–0.1)
Immature Granulocytes: 0 %
Lymphocytes Absolute: 2.1 x10E3/uL (ref 0.7–3.1)
Lymphs: 23 %
MCH: 28 pg (ref 26.6–33.0)
MCHC: 31.9 g/dL (ref 31.5–35.7)
MCV: 88 fL (ref 79–97)
Monocytes Absolute: 0.4 x10E3/uL (ref 0.1–0.9)
Monocytes: 4 %
Neutrophils Absolute: 6 x10E3/uL (ref 1.4–7.0)
Neutrophils: 69 %
Platelets: 356 x10E3/uL (ref 150–450)
RBC: 5.08 x10E6/uL (ref 3.77–5.28)
RDW: 13.1 % (ref 11.7–15.4)
WBC: 8.8 x10E3/uL (ref 3.4–10.8)

## 2023-12-27 LAB — VITAMIN B12: Vitamin B-12: 334 pg/mL (ref 232–1245)

## 2023-12-30 ENCOUNTER — Ambulatory Visit: Payer: Self-pay | Admitting: Family Medicine

## 2024-01-26 ENCOUNTER — Ambulatory Visit: Admitting: Pharmacist

## 2024-02-03 ENCOUNTER — Other Ambulatory Visit (HOSPITAL_COMMUNITY)

## 2024-02-12 ENCOUNTER — Other Ambulatory Visit: Payer: Self-pay | Admitting: Obstetrics and Gynecology

## 2024-02-12 DIAGNOSIS — N921 Excessive and frequent menstruation with irregular cycle: Secondary | ICD-10-CM

## 2024-03-17 ENCOUNTER — Encounter: Payer: Self-pay | Admitting: Family Medicine

## 2024-03-17 ENCOUNTER — Ambulatory Visit: Admitting: Family Medicine

## 2024-03-17 VITALS — BP 136/74 | HR 107 | Ht 66.0 in | Wt 258.6 lb

## 2024-03-17 DIAGNOSIS — L989 Disorder of the skin and subcutaneous tissue, unspecified: Secondary | ICD-10-CM | POA: Diagnosis not present

## 2024-03-17 DIAGNOSIS — E1165 Type 2 diabetes mellitus with hyperglycemia: Secondary | ICD-10-CM

## 2024-03-17 DIAGNOSIS — E66813 Obesity, class 3: Secondary | ICD-10-CM

## 2024-03-17 DIAGNOSIS — Z7984 Long term (current) use of oral hypoglycemic drugs: Secondary | ICD-10-CM

## 2024-03-17 DIAGNOSIS — Z6841 Body Mass Index (BMI) 40.0 and over, adult: Secondary | ICD-10-CM

## 2024-03-17 LAB — POCT GLYCOSYLATED HEMOGLOBIN (HGB A1C): HbA1c, POC (controlled diabetic range): 9.6 % — AB (ref 0.0–7.0)

## 2024-03-17 MED ORDER — METFORMIN HCL ER 750 MG PO TB24: 750.0000 mg | ORAL_TABLET | Freq: Every day | ORAL | 1 refills | Status: AC

## 2024-03-17 NOTE — Progress Notes (Signed)
 "  Established Patient Office Visit  Subjective    Patient ID: Regina Watkins, female    DOB: [birth year]  Age: 39 y.o. MRN: [id number]  CC:  Chief Complaint  Patient presents with   Medical Management of Chronic Issues    HPI Regina Watkins presents for routine weight management. She also reports a skin lesion on her face.   Outpatient Encounter Medications as of 03/17/2024  Medication Sig   metFORMIN  (GLUCOPHAGE -XR) 750 MG 24 hr tablet Take 1 tablet (750 mg total) by mouth daily with breakfast.   norethindrone  (AYGESTIN ) 5 MG tablet Take 1 tablet by mouth once daily   tirzepatide  (MOUNJARO ) 5 MG/0.5ML Pen Inject 5 mg into the skin once a week.   Blood Glucose Monitoring Suppl DEVI 1 each by Does not apply route in the morning, at noon, and at bedtime. May substitute to any manufacturer covered by patient's insurance.   Boric Acid 600 MG SUPP Place 1 tablet vaginally at bedtime. (Patient not taking: Reported on 11/12/2023)   cyclobenzaprine  (FLEXERIL ) 10 MG tablet Take 1 tablet (10 mg total) by mouth 3 (three) times daily as needed for muscle spasms. (Patient not taking: Reported on 11/12/2023)   gabapentin  (NEURONTIN ) 100 MG capsule Take 1 capsule (100 mg total) by mouth 2 (two) times daily. (Patient not taking: Reported on 11/12/2023)   metroNIDAZOLE  (FLAGYL ) 500 MG tablet Take 1 tablet (500 mg total) by mouth 2 (two) times daily. (Patient not taking: Reported on 11/12/2023)   SUMAtriptan  (IMITREX ) 25 MG tablet Take 25 mg (1 tablet total) by mouth at the start of the headache. May repeat in 2 hours x 1 if headache persists. Max of 2 tablets/24 hours. (Patient not taking: Reported on 11/12/2023)   terconazole  (TERAZOL 7 ) 0.4 % vaginal cream Place 1 applicator vaginally at bedtime. Use for seven days (Patient not taking: Reported on 11/12/2023)   No facility-administered encounter medications on file as of 03/17/2024.    Past Medical History:  Diagnosis Date   Anemia    Back pain     BV (bacterial vaginosis)    Diabetes (HCC)    Diabetes (HCC)    Ectopic pregnancy    Infection    UTI   No pertinent past medical history    Obesity    Prediabetes    Sleep apnea    Strep throat    Trichomonas     Past Surgical History:  Procedure Laterality Date   SALPINGECTOMY     removal of ectopic preg and tube   TUBAL LIGATION N/A    Phreesia 10/25/2019    Family History  Problem Relation Age of Onset   Hypertension Mother    Thyroid disease Mother    Diabetes Mother    Cancer Mother    Heart disease Father        CHF   Hypertension Father    Sleep apnea Father    Alcoholism Father    Obesity Father    Diabetes Maternal Grandmother    Hypertension Maternal Grandmother    Cancer Maternal Grandmother        breast   Cancer Paternal Grandmother    Anesthesia problems Neg Hx    Hypotension Neg Hx    Malignant hyperthermia Neg Hx    Pseudochol deficiency Neg Hx     Social History   Socioeconomic History   Marital status: Single    Spouse name: Not on file   Number of children: Not on file  Years of education: Not on file   Highest education level: Not on file  Occupational History   Occupation: [employer]   Occupation: special needs at a day program  Tobacco Use   Smoking status: Never    Passive exposure: Never   Smokeless tobacco: Never  Vaping Use   Vaping status: Never Used  Substance and Sexual Activity   Alcohol use: No    Comment: occ   Drug use: No   Sexual activity: Yes    Birth control/protection: None    Comment: monogamous rlship wiht female partner of 8 years; last intercourse one month ago  Other Topics Concern   Not on file  Social History Narrative   Not on file   Social Drivers of Health   Tobacco Use: Low Risk (03/17/2024)   Patient History    Smoking Tobacco Use: Never    Smokeless Tobacco Use: Never    Passive Exposure: Never  Financial Resource Strain: Low Risk (03/27/2023)   Overall Financial Resource Strain (CARDIA)     Difficulty of Paying Living Expenses: Not hard at all  Food Insecurity: No Food Insecurity (11/13/2022)   Hunger Vital Sign    Worried About Running Out of Food in the Last Year: Never true    Ran Out of Food in the Last Year: Never true  Transportation Needs: No Transportation Needs (11/13/2022)   PRAPARE - Administrator, Civil Service (Medical): No    Lack of Transportation (Non-Medical): No  Physical Activity: Sufficiently Active (03/27/2023)   Exercise Vital Sign    Days of Exercise per Week: 7 days    Minutes of Exercise per Session: 30 min  Stress: No Stress Concern Present (03/27/2023)   [employer] of Occupational Health - Occupational Stress Questionnaire    Feeling of Stress : Only a little  Social Connections: Moderately Isolated (03/27/2023)   Social Connection and Isolation Panel    Frequency of Communication with Friends and Family: More than three times a week    Frequency of Social Gatherings with Friends and Family: More than three times a week    Attends Religious Services: More than 4 times per year    Active Member of [employer] or Organizations: No    Attends Banker Meetings: Never    Marital Status: Never married  Intimate Partner Violence: Not At Risk (03/27/2023)   Humiliation, Afraid, Rape, and Kick questionnaire    Fear of Current or Ex-Partner: No    Emotionally Abused: No    Physically Abused: No    Sexually Abused: No  Depression (PHQ2-9): Medium Risk (11/20/2023)   Depression (PHQ2-9)    PHQ-2 Score: 5  Alcohol Screen: Low Risk (03/27/2023)   Alcohol Screen    Last Alcohol Screening Score (AUDIT): 1  Housing: Unknown (03/27/2023)   Housing Stability Vital Sign    Unable to Pay for Housing in the Last Year: No    Number of Times Moved in the Last Year: Not on file    Homeless in the Last Year: No  Utilities: Not At Risk (03/27/2023)   AHC Utilities    Threatened with loss of utilities: No  Health Literacy: Adequate Health  Literacy (03/27/2023)   B1300 Health Literacy    Frequency of need for help with medical instructions: Never    Review of Systems  All other systems reviewed and are negative.       Objective    BP 136/74   Pulse (!) 107  Ht 5' 6 (1.676 m)   Wt 258 lb 9.6 oz (117.3 kg)   LMP 02/01/2024   SpO2 96%   BMI 41.74 kg/m   Physical Exam Vitals and nursing note reviewed.  Constitutional:      General: She is not in acute distress.    Appearance: She is obese.  Cardiovascular:     Rate and Rhythm: Normal rate and regular rhythm.  Pulmonary:     Effort: Pulmonary effort is normal.     Breath sounds: Normal breath sounds.  Abdominal:     Palpations: Abdomen is soft.     Tenderness: There is no abdominal tenderness.  Skin:    Findings: Lesion (solitary papular lesion under right eye) present.  Neurological:     General: No focal deficit present.     Mental Status: She is alert and oriented to person, place, and time.         Assessment & Plan:   Skin lesion of face -     Ambulatory referral to Dermatology  Type 2 diabetes mellitus with hyperglycemia, without long-term current use of insulin  (HCC) -     POCT glycosylated hemoglobin (Hb A1C)  Class 3 severe obesity with serious comorbidity and body mass index (BMI) of 40.0 to 44.9 in adult, unspecified obesity type (HCC)  Other orders -     metFORMIN  HCl ER; Take 1 tablet (750 mg total) by mouth daily with breakfast.  Dispense: 90 tablet; Refill: 1   Patient to consider if she wants to start GLP-1  No follow-ups on file.   Tanda Raguel SQUIBB, MD  "

## 2024-03-18 ENCOUNTER — Ambulatory Visit: Payer: Self-pay | Admitting: Family Medicine

## 2024-03-18 ENCOUNTER — Encounter: Payer: Self-pay | Admitting: Family Medicine

## 2024-04-08 ENCOUNTER — Telehealth: Payer: Self-pay | Admitting: Family Medicine

## 2024-04-08 NOTE — Telephone Encounter (Signed)
 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 Patient requested to send it back via Call Patient to pick up within 7-days to 14days. Document is located in providers tray at front office.Please advise at Mobile [phone] (mobile)

## 2024-04-09 NOTE — Telephone Encounter (Signed)
 Given to Dr. Tanda for review

## 2024-05-18 ENCOUNTER — Ambulatory Visit: Admitting: Obstetrics and Gynecology

## 2024-06-15 ENCOUNTER — Ambulatory Visit: Payer: Self-pay | Admitting: Family Medicine
